# Patient Record
Sex: Female | Born: 1945 | Race: White | Hispanic: No | Marital: Single | Smoking: Former smoker
Health system: Southern US, Community
[De-identification: ages and names within clinical notes are randomized; demographics above are authoritative.]

## PROBLEM LIST (undated history)

## (undated) DIAGNOSIS — I10 Essential (primary) hypertension: Secondary | ICD-10-CM

## (undated) DIAGNOSIS — M1990 Unspecified osteoarthritis, unspecified site: Secondary | ICD-10-CM

## (undated) DIAGNOSIS — J45909 Unspecified asthma, uncomplicated: Secondary | ICD-10-CM

## (undated) DIAGNOSIS — M1611 Unilateral primary osteoarthritis, right hip: Secondary | ICD-10-CM

## (undated) DIAGNOSIS — J302 Other seasonal allergic rhinitis: Secondary | ICD-10-CM

## (undated) HISTORY — PX: ELBOW ARTHROSCOPY WITH TENDON RECONSTRUCTION: SHX5616

## (undated) HISTORY — DX: Unilateral primary osteoarthritis, right hip: M16.11

## (undated) HISTORY — PX: FOOT SURGERY: SHX648

## (undated) HISTORY — DX: Essential (primary) hypertension: I10

---

## 1997-12-19 ENCOUNTER — Other Ambulatory Visit: Admission: RE | Admit: 1997-12-19 | Discharge: 1997-12-19 | Payer: Self-pay | Admitting: Obstetrics and Gynecology

## 1999-01-30 ENCOUNTER — Ambulatory Visit (HOSPITAL_COMMUNITY): Admission: RE | Admit: 1999-01-30 | Discharge: 1999-01-30 | Payer: Self-pay | Admitting: Obstetrics and Gynecology

## 1999-01-30 ENCOUNTER — Encounter: Payer: Self-pay | Admitting: Obstetrics and Gynecology

## 1999-08-20 ENCOUNTER — Other Ambulatory Visit: Admission: RE | Admit: 1999-08-20 | Discharge: 1999-08-20 | Payer: Self-pay | Admitting: Obstetrics and Gynecology

## 2000-02-02 ENCOUNTER — Ambulatory Visit (HOSPITAL_COMMUNITY): Admission: RE | Admit: 2000-02-02 | Discharge: 2000-02-02 | Payer: Self-pay | Admitting: Obstetrics and Gynecology

## 2000-02-02 ENCOUNTER — Encounter: Payer: Self-pay | Admitting: Obstetrics and Gynecology

## 2000-09-14 ENCOUNTER — Other Ambulatory Visit: Admission: RE | Admit: 2000-09-14 | Discharge: 2000-09-14 | Payer: Self-pay | Admitting: Obstetrics and Gynecology

## 2001-07-01 ENCOUNTER — Encounter: Admission: RE | Admit: 2001-07-01 | Discharge: 2001-07-01 | Payer: Self-pay | Admitting: Obstetrics and Gynecology

## 2001-07-01 ENCOUNTER — Encounter: Payer: Self-pay | Admitting: Obstetrics and Gynecology

## 2001-07-21 ENCOUNTER — Encounter: Admission: RE | Admit: 2001-07-21 | Discharge: 2001-07-21 | Payer: Self-pay | Admitting: Obstetrics and Gynecology

## 2001-07-21 ENCOUNTER — Encounter: Payer: Self-pay | Admitting: Obstetrics and Gynecology

## 2001-11-28 ENCOUNTER — Other Ambulatory Visit: Admission: RE | Admit: 2001-11-28 | Discharge: 2001-11-28 | Payer: Self-pay | Admitting: Obstetrics and Gynecology

## 2003-01-17 ENCOUNTER — Other Ambulatory Visit: Admission: RE | Admit: 2003-01-17 | Discharge: 2003-01-17 | Payer: Self-pay | Admitting: Obstetrics and Gynecology

## 2004-02-20 ENCOUNTER — Other Ambulatory Visit: Admission: RE | Admit: 2004-02-20 | Discharge: 2004-02-20 | Payer: Self-pay | Admitting: Obstetrics and Gynecology

## 2005-10-29 ENCOUNTER — Encounter: Admission: RE | Admit: 2005-10-29 | Discharge: 2005-10-29 | Payer: Self-pay | Admitting: Obstetrics and Gynecology

## 2006-04-29 ENCOUNTER — Encounter: Admission: RE | Admit: 2006-04-29 | Discharge: 2006-07-28 | Payer: Self-pay | Admitting: Family Medicine

## 2010-06-26 ENCOUNTER — Ambulatory Visit (INDEPENDENT_AMBULATORY_CARE_PROVIDER_SITE_OTHER): Payer: 59 | Admitting: Internal Medicine

## 2010-06-26 ENCOUNTER — Encounter: Payer: Self-pay | Admitting: Internal Medicine

## 2010-06-26 ENCOUNTER — Other Ambulatory Visit: Payer: Self-pay | Admitting: *Deleted

## 2010-06-26 VITALS — BP 133/74 | HR 77 | Temp 97.2°F | Ht 63.0 in | Wt 204.0 lb

## 2010-06-26 DIAGNOSIS — L0390 Cellulitis, unspecified: Secondary | ICD-10-CM | POA: Insufficient documentation

## 2010-06-26 DIAGNOSIS — L0291 Cutaneous abscess, unspecified: Secondary | ICD-10-CM

## 2010-06-26 MED ORDER — DOXYCYCLINE HYCLATE 100 MG PO CAPS: 100.0000 mg | ORAL_CAPSULE | Freq: Two times a day (BID) | ORAL | Status: DC

## 2010-06-26 NOTE — Assessment & Plan Note (Addendum)
I am concerned with poor healing that it contains an abscess or osteomyelitis.  Will check an MRI.  If negative for drainable focus or osteo, will treat with Rocephin for 7-10 days.    ADDENDUM: MRI results noted, it is not concerning for osteomyelitis or abscess (or even soft tissue infection) but with Carcot, though patient is not a diabetic (glucose is normal).  She will be referred to an orthopedist and I will treat presumed cellulitis with Rocephin.

## 2010-06-26 NOTE — Progress Notes (Signed)
  Subjective:    Patient ID: [name], female    DOB: 05-Apr-1945, 65 y.o.   MRN: [id number]  HPI 65 year old female with HTN comes with a complaint of soft tissue swelling of left foot over old graft site for 3 weeks.  She has had some fever, chills and continues to have some drainage.  She has been on antibiotics for 3 weeks and has had little relief.  Culture with Beta Hemolytic Strep and CoNS.  Had been on Bactrim and Keflex, now on Doxycycline and received 1 shot of Rocephin.  No recent fevers, chills.  No drainage but has continued to have swelling and erythema.     Review of Systems  Constitutional: Negative.   HENT: Negative.   Eyes: Negative.   Respiratory: Negative.   Cardiovascular: Negative.   Gastrointestinal: Negative.   Genitourinary: Negative.   Musculoskeletal: Negative.   Skin: Negative.   Neurological: Negative.   Hematological: Negative.   Psychiatric/Behavioral: Negative.        Objective:   Physical Exam  Constitutional: She appears well-developed and well-nourished.  HENT:  Mouth/Throat: No oropharyngeal exudate.  Cardiovascular: Normal rate, regular rhythm and normal heart sounds.  Exam reveals no gallop.   No murmur heard. Pulmonary/Chest: Effort normal and breath sounds normal. No respiratory distress.  Skin:       Left foot with erythema over graft area, some fluctuance.  Warmth.    Psychiatric: She has a normal mood and affect. Her behavior is normal.          Assessment & Plan:

## 2010-06-27 ENCOUNTER — Other Ambulatory Visit: Payer: Self-pay | Admitting: *Deleted

## 2010-06-27 DIAGNOSIS — I10 Essential (primary) hypertension: Secondary | ICD-10-CM

## 2010-06-27 DIAGNOSIS — L0390 Cellulitis, unspecified: Secondary | ICD-10-CM

## 2010-06-30 ENCOUNTER — Ambulatory Visit (HOSPITAL_COMMUNITY)
Admission: RE | Admit: 2010-06-30 | Discharge: 2010-06-30 | Disposition: A | Discharge status: Home / Self Care | Payer: 59 | Source: Ambulatory Visit | Attending: Internal Medicine | Admitting: Internal Medicine

## 2010-06-30 DIAGNOSIS — M79609 Pain in unspecified limb: Secondary | ICD-10-CM | POA: Insufficient documentation

## 2010-06-30 DIAGNOSIS — R609 Edema, unspecified: Secondary | ICD-10-CM | POA: Insufficient documentation

## 2010-06-30 DIAGNOSIS — M19079 Primary osteoarthritis, unspecified ankle and foot: Secondary | ICD-10-CM | POA: Insufficient documentation

## 2010-06-30 LAB — CBC
HCT: 34.5 % — ABNORMAL LOW (ref 36.0–46.0)
MCV: 85 fL (ref 78.0–100.0)
RBC: 4.06 MIL/uL (ref 3.87–5.11)
WBC: 6.5 10*3/uL (ref 4.0–10.5)

## 2010-06-30 LAB — COMPREHENSIVE METABOLIC PANEL
ALT: 11 U/L (ref 0–35)
AST: 13 U/L (ref 0–37)
Calcium: 9.5 mg/dL (ref 8.4–10.5)
Creatinine, Ser: <0.47 mg/dL — ABNORMAL LOW (ref 0.50–1.10)
Sodium: 139 mEq/L (ref 135–145)
Total Protein: 6 g/dL (ref 6.0–8.3)

## 2010-06-30 LAB — DIFFERENTIAL
Eosinophils Relative: 3 % (ref 0–5)
Lymphocytes Relative: 29 % (ref 12–46)
Lymphs Abs: 1.9 10*3/uL (ref 0.7–4.0)
Neutrophils Relative %: 61 % (ref 43–77)

## 2010-06-30 MED ORDER — GADOBENATE DIMEGLUMINE 529 MG/ML IV SOLN
20.0000 mL | Freq: Once | INTRAVENOUS | Status: AC
  Administered 2010-06-30: 20 mL via INTRAVENOUS

## 2010-07-01 ENCOUNTER — Ambulatory Visit (INDEPENDENT_AMBULATORY_CARE_PROVIDER_SITE_OTHER): Payer: 59 | Admitting: Internal Medicine

## 2010-07-01 DIAGNOSIS — L0390 Cellulitis, unspecified: Secondary | ICD-10-CM

## 2010-07-01 MED ORDER — CEFTRIAXONE SODIUM 1 G IJ SOLR
1.0000 g | INTRAMUSCULAR | Status: AC
  Administered 2010-07-01 – 2010-07-10 (×7): 1 g via INTRAMUSCULAR

## 2010-07-01 NOTE — Progress Notes (Signed)
Addended by: Staci Righter on: 07/01/2010 11:00 AM   Modules accepted: Orders

## 2010-07-01 NOTE — Progress Notes (Signed)
Addended by: Staci Righter on: 07/01/2010 11:08 AM   Modules accepted: Orders

## 2010-07-02 ENCOUNTER — Ambulatory Visit (INDEPENDENT_AMBULATORY_CARE_PROVIDER_SITE_OTHER): Payer: 59 | Admitting: Internal Medicine

## 2010-07-02 ENCOUNTER — Encounter (HOSPITAL_BASED_OUTPATIENT_CLINIC_OR_DEPARTMENT_OTHER): Payer: 59

## 2010-07-02 DIAGNOSIS — L0390 Cellulitis, unspecified: Secondary | ICD-10-CM

## 2010-07-03 ENCOUNTER — Ambulatory Visit: Payer: 59

## 2010-07-03 ENCOUNTER — Ambulatory Visit (INDEPENDENT_AMBULATORY_CARE_PROVIDER_SITE_OTHER): Payer: 59 | Admitting: Internal Medicine

## 2010-07-03 DIAGNOSIS — L0390 Cellulitis, unspecified: Secondary | ICD-10-CM

## 2010-07-03 DIAGNOSIS — L0291 Cutaneous abscess, unspecified: Secondary | ICD-10-CM

## 2010-07-04 ENCOUNTER — Ambulatory Visit (INDEPENDENT_AMBULATORY_CARE_PROVIDER_SITE_OTHER): Payer: 59 | Admitting: Internal Medicine

## 2010-07-04 DIAGNOSIS — L0291 Cutaneous abscess, unspecified: Secondary | ICD-10-CM

## 2010-07-04 DIAGNOSIS — L0390 Cellulitis, unspecified: Secondary | ICD-10-CM

## 2010-07-05 ENCOUNTER — Inpatient Hospital Stay (INDEPENDENT_AMBULATORY_CARE_PROVIDER_SITE_OTHER)
Admission: RE | Admit: 2010-07-05 | Discharge: 2010-07-05 | Disposition: A | Discharge status: Home / Self Care | Payer: 59 | Source: Ambulatory Visit | Attending: Family Medicine | Admitting: Family Medicine

## 2010-07-05 DIAGNOSIS — B999 Unspecified infectious disease: Secondary | ICD-10-CM

## 2010-07-06 ENCOUNTER — Inpatient Hospital Stay (INDEPENDENT_AMBULATORY_CARE_PROVIDER_SITE_OTHER)
Admission: RE | Admit: 2010-07-06 | Discharge: 2010-07-06 | Disposition: A | Discharge status: Home / Self Care | Payer: 59 | Source: Ambulatory Visit | Attending: Emergency Medicine | Admitting: Emergency Medicine

## 2010-07-06 DIAGNOSIS — B999 Unspecified infectious disease: Secondary | ICD-10-CM

## 2010-07-07 ENCOUNTER — Ambulatory Visit (INDEPENDENT_AMBULATORY_CARE_PROVIDER_SITE_OTHER): Payer: 59 | Admitting: Internal Medicine

## 2010-07-07 DIAGNOSIS — L0390 Cellulitis, unspecified: Secondary | ICD-10-CM

## 2010-07-08 ENCOUNTER — Ambulatory Visit (INDEPENDENT_AMBULATORY_CARE_PROVIDER_SITE_OTHER): Payer: 59 | Admitting: Internal Medicine

## 2010-07-08 DIAGNOSIS — L0390 Cellulitis, unspecified: Secondary | ICD-10-CM

## 2010-07-09 ENCOUNTER — Inpatient Hospital Stay (INDEPENDENT_AMBULATORY_CARE_PROVIDER_SITE_OTHER)
Admission: RE | Admit: 2010-07-09 | Discharge: 2010-07-09 | Disposition: A | Discharge status: Home / Self Care | Payer: 59 | Source: Ambulatory Visit

## 2010-07-09 DIAGNOSIS — R6889 Other general symptoms and signs: Secondary | ICD-10-CM

## 2010-07-10 ENCOUNTER — Ambulatory Visit (INDEPENDENT_AMBULATORY_CARE_PROVIDER_SITE_OTHER): Payer: 59 | Admitting: Internal Medicine

## 2010-07-10 DIAGNOSIS — L0390 Cellulitis, unspecified: Secondary | ICD-10-CM

## 2010-07-10 DIAGNOSIS — L0291 Cutaneous abscess, unspecified: Secondary | ICD-10-CM

## 2010-07-21 ENCOUNTER — Ambulatory Visit (INDEPENDENT_AMBULATORY_CARE_PROVIDER_SITE_OTHER): Payer: 59 | Admitting: Internal Medicine

## 2010-07-21 ENCOUNTER — Encounter: Payer: Self-pay | Admitting: Internal Medicine

## 2010-07-21 VITALS — BP 123/83 | HR 75 | Temp 98.2°F | Ht 63.0 in | Wt 208.0 lb

## 2010-07-21 DIAGNOSIS — L0390 Cellulitis, unspecified: Secondary | ICD-10-CM

## 2010-07-21 NOTE — Assessment & Plan Note (Addendum)
Patient is now improved after the 10 days of Rocephin.  Now off crutches and feels much better and pleased with the therapy.  Saw ortho to advise on deformity.  Given good long term expectations.  No current infection.  Return PRN.

## 2010-07-21 NOTE — Progress Notes (Signed)
  Subjective:    Patient ID: [name], female    DOB: August 30, 1945, 65 y.o.   MRN: [id number]  HPI here for follow up after her right leg cellulitis was treated with IM Rocephin.  Feels better, pain much better, no fever, walking without crutches.  No erythema, no chills.  She also did see ortho due to the abnormal MRI.  Discussed long term management of her foot, but no acute issues identified.      Review of Systems  Constitutional: Negative.   HENT: Negative.   Respiratory: Negative.   Cardiovascular: Negative.   Gastrointestinal: Negative.   Musculoskeletal:       Improved ambulation, no crutches  Neurological: Negative.   Psychiatric/Behavioral: Negative.        Objective:   Physical Exam  Constitutional: She is oriented to person, place, and time. She appears well-developed and well-nourished. No distress.  Cardiovascular: Normal rate, regular rhythm and normal heart sounds.  Exam reveals no gallop and no friction rub.   No murmur heard. Pulmonary/Chest: Effort normal and breath sounds normal. No respiratory distress. She has no wheezes. She has no rales.  Abdominal: Soft. Bowel sounds are normal. She exhibits no distension. There is no tenderness.  Musculoskeletal: She exhibits edema.       Some edema of left leg but not unusual for her  Neurological: She is alert and oriented to person, place, and time.  Skin: Skin is warm and dry.  Psychiatric: She has a normal mood and affect. Her behavior is normal.          Assessment & Plan:

## 2010-07-30 ENCOUNTER — Encounter (HOSPITAL_BASED_OUTPATIENT_CLINIC_OR_DEPARTMENT_OTHER): Payer: 59

## 2010-08-13 NOTE — Progress Notes (Signed)
  Subjective:    Patient ID: [name], female    DOB: 03-Oct-1945, 65 y.o.   MRN: [id number]  HPI Pt here for continuation of her injections of Rocephin for cellulitis.     Review of Systems     Objective:   Physical Exam        Assessment & Plan:

## 2011-10-06 DIAGNOSIS — Z23 Encounter for immunization: Secondary | ICD-10-CM | POA: Diagnosis not present

## 2011-10-13 DIAGNOSIS — E785 Hyperlipidemia, unspecified: Secondary | ICD-10-CM | POA: Diagnosis not present

## 2011-10-13 DIAGNOSIS — L259 Unspecified contact dermatitis, unspecified cause: Secondary | ICD-10-CM | POA: Diagnosis not present

## 2011-10-13 DIAGNOSIS — I10 Essential (primary) hypertension: Secondary | ICD-10-CM | POA: Diagnosis not present

## 2011-11-04 DIAGNOSIS — J029 Acute pharyngitis, unspecified: Secondary | ICD-10-CM | POA: Diagnosis not present

## 2012-03-08 DIAGNOSIS — L02519 Cutaneous abscess of unspecified hand: Secondary | ICD-10-CM | POA: Diagnosis not present

## 2012-03-08 DIAGNOSIS — L03119 Cellulitis of unspecified part of limb: Secondary | ICD-10-CM | POA: Diagnosis not present

## 2012-04-12 DIAGNOSIS — I10 Essential (primary) hypertension: Secondary | ICD-10-CM | POA: Diagnosis not present

## 2012-10-05 DIAGNOSIS — Z23 Encounter for immunization: Secondary | ICD-10-CM | POA: Diagnosis not present

## 2013-01-11 DIAGNOSIS — I10 Essential (primary) hypertension: Secondary | ICD-10-CM | POA: Diagnosis not present

## 2013-02-28 DIAGNOSIS — W1809XA Striking against other object with subsequent fall, initial encounter: Secondary | ICD-10-CM | POA: Insufficient documentation

## 2013-02-28 DIAGNOSIS — W108XXA Fall (on) (from) other stairs and steps, initial encounter: Secondary | ICD-10-CM | POA: Insufficient documentation

## 2013-02-28 DIAGNOSIS — Z79899 Other long term (current) drug therapy: Secondary | ICD-10-CM | POA: Insufficient documentation

## 2013-02-28 DIAGNOSIS — S7000XA Contusion of unspecified hip, initial encounter: Secondary | ICD-10-CM | POA: Diagnosis not present

## 2013-02-28 DIAGNOSIS — Z87891 Personal history of nicotine dependence: Secondary | ICD-10-CM | POA: Diagnosis not present

## 2013-02-28 DIAGNOSIS — M25559 Pain in unspecified hip: Secondary | ICD-10-CM | POA: Diagnosis not present

## 2013-02-28 DIAGNOSIS — T1490XA Injury, unspecified, initial encounter: Secondary | ICD-10-CM | POA: Diagnosis not present

## 2013-02-28 DIAGNOSIS — S0100XA Unspecified open wound of scalp, initial encounter: Secondary | ICD-10-CM | POA: Diagnosis not present

## 2013-02-28 DIAGNOSIS — S5000XA Contusion of unspecified elbow, initial encounter: Secondary | ICD-10-CM | POA: Diagnosis not present

## 2013-02-28 DIAGNOSIS — Y9389 Activity, other specified: Secondary | ICD-10-CM | POA: Insufficient documentation

## 2013-02-28 DIAGNOSIS — Z23 Encounter for immunization: Secondary | ICD-10-CM | POA: Insufficient documentation

## 2013-02-28 DIAGNOSIS — I10 Essential (primary) hypertension: Secondary | ICD-10-CM | POA: Diagnosis not present

## 2013-02-28 DIAGNOSIS — S79919A Unspecified injury of unspecified hip, initial encounter: Secondary | ICD-10-CM | POA: Diagnosis not present

## 2013-02-28 DIAGNOSIS — Y92009 Unspecified place in unspecified non-institutional (private) residence as the place of occurrence of the external cause: Secondary | ICD-10-CM | POA: Insufficient documentation
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
## 2013-03-01 ENCOUNTER — Emergency Department (HOSPITAL_COMMUNITY)
Admission: EM | Admit: 2013-03-01 | Discharge: 2013-03-01 | Disposition: A | Discharge status: Home / Self Care | Payer: Medicare Other | Attending: Emergency Medicine | Admitting: Emergency Medicine

## 2013-03-01 ENCOUNTER — Emergency Department (HOSPITAL_COMMUNITY): Payer: Medicare Other

## 2013-03-01 ENCOUNTER — Encounter (HOSPITAL_COMMUNITY): Payer: Self-pay | Admitting: Emergency Medicine

## 2013-03-01 DIAGNOSIS — S79929A Unspecified injury of unspecified thigh, initial encounter: Secondary | ICD-10-CM | POA: Diagnosis not present

## 2013-03-01 DIAGNOSIS — M25559 Pain in unspecified hip: Secondary | ICD-10-CM | POA: Diagnosis not present

## 2013-03-01 DIAGNOSIS — S0191XA Laceration without foreign body of unspecified part of head, initial encounter: Secondary | ICD-10-CM

## 2013-03-01 DIAGNOSIS — S79919A Unspecified injury of unspecified hip, initial encounter: Secondary | ICD-10-CM | POA: Diagnosis not present

## 2013-03-01 DIAGNOSIS — S5002XA Contusion of left elbow, initial encounter: Secondary | ICD-10-CM

## 2013-03-01 DIAGNOSIS — S7000XA Contusion of unspecified hip, initial encounter: Secondary | ICD-10-CM

## 2013-03-01 DIAGNOSIS — W19XXXA Unspecified fall, initial encounter: Secondary | ICD-10-CM

## 2013-03-01 MED ORDER — TETANUS-DIPHTH-ACELL PERTUSSIS 5-2.5-18.5 LF-MCG/0.5 IM SUSP
0.5000 mL | Freq: Once | INTRAMUSCULAR | Status: AC
  Administered 2013-03-01: 0.5 mL via INTRAMUSCULAR

## 2013-03-01 NOTE — ED Provider Notes (Signed)
Medical screening examination/treatment/procedure(s) were conducted as a shared visit with non-physician practitioner(s) or resident  and myself.  I personally evaluated the patient during the encounter and agree with the findings and plan unless otherwise indicated.    I have personally reviewed any xrays and/ or EKG's with the provider and I agree with interpretation.   Pt fell down two steps and hit her head on wood floor.  No loc, vomiting, blood thinners or headache.  Neuro intact.   Alert, oriented, conversant, smiling, neck supple, eofmi, perrl, laceration to scalp, mild bleeding.  CNs intact.  Discussed r/b of CT scan, pt very low pretest prob, pt requesting to hold at this time, strict reasons to return given. Lac repaired by myself and PA, I assisted with staples.    Fall, Acute head injury, scalp laceration  Mariea Clonts, MD 03/01/13 [phone]

## 2013-03-01 NOTE — Discharge Instructions (Signed)
Call for a follow up appointment with a Family or Primary Care Provider for a suture removal in 6-8 days.  Return if Symptoms worsen.   Take medication as prescribed. You can take Ibuprofen for muscle and joint pain as needed, take with food.

## 2013-03-01 NOTE — ED Notes (Signed)
Patient transported to X-ray 
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
## 2013-03-06 NOTE — ED Provider Notes (Signed)
Medical screening examination/treatment/procedure(s) were conducted as a shared visit with non-physician practitioner(s) or resident  and myself.  I personally evaluated the patient during the encounter and agree with the findings and plan unless otherwise indicated.    I have personally reviewed any xrays and/ or EKG's with the provider and I agree with interpretation.   See separate note, I assisted with lac repair.  Mariea Clonts, MD 03/06/13 [phone]

## 2013-03-08 DIAGNOSIS — Z4802 Encounter for removal of sutures: Secondary | ICD-10-CM | POA: Diagnosis not present

## 2013-03-08 DIAGNOSIS — S0190XA Unspecified open wound of unspecified part of head, initial encounter: Secondary | ICD-10-CM | POA: Diagnosis not present

## 2013-10-20 ENCOUNTER — Other Ambulatory Visit: Payer: Self-pay

## 2013-11-01 DIAGNOSIS — L249 Irritant contact dermatitis, unspecified cause: Secondary | ICD-10-CM | POA: Diagnosis not present

## 2013-11-01 DIAGNOSIS — Z23 Encounter for immunization: Secondary | ICD-10-CM | POA: Diagnosis not present

## 2013-11-01 DIAGNOSIS — I10 Essential (primary) hypertension: Secondary | ICD-10-CM | POA: Diagnosis not present

## 2013-11-01 DIAGNOSIS — E785 Hyperlipidemia, unspecified: Secondary | ICD-10-CM | POA: Diagnosis not present

## 2013-12-15 DIAGNOSIS — M549 Dorsalgia, unspecified: Secondary | ICD-10-CM | POA: Diagnosis not present

## 2014-03-26 DIAGNOSIS — J45909 Unspecified asthma, uncomplicated: Secondary | ICD-10-CM | POA: Diagnosis not present

## 2014-03-26 DIAGNOSIS — J209 Acute bronchitis, unspecified: Secondary | ICD-10-CM | POA: Diagnosis not present

## 2014-04-09 DIAGNOSIS — J45909 Unspecified asthma, uncomplicated: Secondary | ICD-10-CM | POA: Diagnosis not present

## 2014-04-09 DIAGNOSIS — I10 Essential (primary) hypertension: Secondary | ICD-10-CM | POA: Diagnosis not present

## 2014-04-30 DIAGNOSIS — J302 Other seasonal allergic rhinitis: Secondary | ICD-10-CM | POA: Diagnosis not present

## 2014-04-30 DIAGNOSIS — M1990 Unspecified osteoarthritis, unspecified site: Secondary | ICD-10-CM | POA: Diagnosis not present

## 2014-04-30 DIAGNOSIS — I10 Essential (primary) hypertension: Secondary | ICD-10-CM | POA: Diagnosis not present

## 2014-04-30 DIAGNOSIS — J45909 Unspecified asthma, uncomplicated: Secondary | ICD-10-CM | POA: Diagnosis not present

## 2014-05-16 DIAGNOSIS — J302 Other seasonal allergic rhinitis: Secondary | ICD-10-CM | POA: Diagnosis not present

## 2014-05-16 DIAGNOSIS — J209 Acute bronchitis, unspecified: Secondary | ICD-10-CM | POA: Diagnosis not present

## 2014-05-16 DIAGNOSIS — J45909 Unspecified asthma, uncomplicated: Secondary | ICD-10-CM | POA: Diagnosis not present

## 2014-07-02 ENCOUNTER — Other Ambulatory Visit: Payer: Self-pay

## 2014-07-03 DIAGNOSIS — I10 Essential (primary) hypertension: Secondary | ICD-10-CM | POA: Diagnosis not present

## 2014-07-03 DIAGNOSIS — J45909 Unspecified asthma, uncomplicated: Secondary | ICD-10-CM | POA: Diagnosis not present

## 2014-08-09 DIAGNOSIS — J45909 Unspecified asthma, uncomplicated: Secondary | ICD-10-CM | POA: Diagnosis not present

## 2014-08-09 DIAGNOSIS — J984 Other disorders of lung: Secondary | ICD-10-CM | POA: Diagnosis not present

## 2014-08-09 DIAGNOSIS — E785 Hyperlipidemia, unspecified: Secondary | ICD-10-CM | POA: Diagnosis not present

## 2014-08-09 DIAGNOSIS — E669 Obesity, unspecified: Secondary | ICD-10-CM | POA: Diagnosis not present

## 2014-08-09 DIAGNOSIS — I10 Essential (primary) hypertension: Secondary | ICD-10-CM | POA: Diagnosis not present

## 2014-09-20 DIAGNOSIS — M25552 Pain in left hip: Secondary | ICD-10-CM | POA: Diagnosis not present

## 2014-09-24 DIAGNOSIS — M25551 Pain in right hip: Secondary | ICD-10-CM | POA: Diagnosis not present

## 2014-09-24 DIAGNOSIS — M1611 Unilateral primary osteoarthritis, right hip: Secondary | ICD-10-CM | POA: Diagnosis not present

## 2014-09-27 DIAGNOSIS — M25552 Pain in left hip: Secondary | ICD-10-CM | POA: Diagnosis not present

## 2014-10-18 DIAGNOSIS — M170 Bilateral primary osteoarthritis of knee: Secondary | ICD-10-CM | POA: Diagnosis not present

## 2014-10-18 DIAGNOSIS — M25551 Pain in right hip: Secondary | ICD-10-CM | POA: Diagnosis not present

## 2014-10-23 DIAGNOSIS — M25561 Pain in right knee: Secondary | ICD-10-CM | POA: Diagnosis not present

## 2014-12-10 DIAGNOSIS — E785 Hyperlipidemia, unspecified: Secondary | ICD-10-CM | POA: Diagnosis not present

## 2014-12-10 DIAGNOSIS — Z23 Encounter for immunization: Secondary | ICD-10-CM | POA: Diagnosis not present

## 2014-12-10 DIAGNOSIS — E669 Obesity, unspecified: Secondary | ICD-10-CM | POA: Diagnosis not present

## 2014-12-10 DIAGNOSIS — Z1239 Encounter for other screening for malignant neoplasm of breast: Secondary | ICD-10-CM | POA: Diagnosis not present

## 2014-12-10 DIAGNOSIS — Z1211 Encounter for screening for malignant neoplasm of colon: Secondary | ICD-10-CM | POA: Diagnosis not present

## 2014-12-10 DIAGNOSIS — J302 Other seasonal allergic rhinitis: Secondary | ICD-10-CM | POA: Diagnosis not present

## 2014-12-10 DIAGNOSIS — J45909 Unspecified asthma, uncomplicated: Secondary | ICD-10-CM | POA: Diagnosis not present

## 2014-12-10 DIAGNOSIS — I10 Essential (primary) hypertension: Secondary | ICD-10-CM | POA: Diagnosis not present

## 2014-12-10 DIAGNOSIS — M1990 Unspecified osteoarthritis, unspecified site: Secondary | ICD-10-CM | POA: Diagnosis not present

## 2014-12-10 DIAGNOSIS — J984 Other disorders of lung: Secondary | ICD-10-CM | POA: Diagnosis not present

## 2015-01-01 DIAGNOSIS — H2511 Age-related nuclear cataract, right eye: Secondary | ICD-10-CM | POA: Diagnosis not present

## 2015-01-01 DIAGNOSIS — H2512 Age-related nuclear cataract, left eye: Secondary | ICD-10-CM | POA: Diagnosis not present

## 2015-01-24 DIAGNOSIS — M1611 Unilateral primary osteoarthritis, right hip: Secondary | ICD-10-CM | POA: Diagnosis not present

## 2015-01-24 DIAGNOSIS — M25561 Pain in right knee: Secondary | ICD-10-CM | POA: Diagnosis not present

## 2015-04-29 DIAGNOSIS — M1711 Unilateral primary osteoarthritis, right knee: Secondary | ICD-10-CM | POA: Diagnosis not present

## 2015-04-29 DIAGNOSIS — M1712 Unilateral primary osteoarthritis, left knee: Secondary | ICD-10-CM | POA: Diagnosis not present

## 2015-05-02 DIAGNOSIS — M1611 Unilateral primary osteoarthritis, right hip: Secondary | ICD-10-CM | POA: Diagnosis not present

## 2015-05-02 DIAGNOSIS — M25561 Pain in right knee: Secondary | ICD-10-CM | POA: Diagnosis not present

## 2015-05-10 DIAGNOSIS — M1990 Unspecified osteoarthritis, unspecified site: Secondary | ICD-10-CM | POA: Diagnosis not present

## 2015-05-10 DIAGNOSIS — I10 Essential (primary) hypertension: Secondary | ICD-10-CM | POA: Diagnosis not present

## 2015-05-10 DIAGNOSIS — E785 Hyperlipidemia, unspecified: Secondary | ICD-10-CM | POA: Diagnosis not present

## 2015-05-10 DIAGNOSIS — J45909 Unspecified asthma, uncomplicated: Secondary | ICD-10-CM | POA: Diagnosis not present

## 2015-05-10 DIAGNOSIS — E669 Obesity, unspecified: Secondary | ICD-10-CM | POA: Diagnosis not present

## 2015-08-06 DIAGNOSIS — M1611 Unilateral primary osteoarthritis, right hip: Secondary | ICD-10-CM | POA: Diagnosis not present

## 2015-08-12 DIAGNOSIS — M1711 Unilateral primary osteoarthritis, right knee: Secondary | ICD-10-CM | POA: Diagnosis not present

## 2015-08-12 DIAGNOSIS — M25561 Pain in right knee: Secondary | ICD-10-CM | POA: Diagnosis not present

## 2015-08-12 DIAGNOSIS — M1611 Unilateral primary osteoarthritis, right hip: Secondary | ICD-10-CM | POA: Diagnosis not present

## 2015-08-12 DIAGNOSIS — M1712 Unilateral primary osteoarthritis, left knee: Secondary | ICD-10-CM | POA: Diagnosis not present

## 2015-08-24 ENCOUNTER — Encounter (INDEPENDENT_AMBULATORY_CARE_PROVIDER_SITE_OTHER): Payer: Self-pay

## 2015-08-24 DIAGNOSIS — M25551 Pain in right hip: Secondary | ICD-10-CM

## 2015-09-10 DIAGNOSIS — M25551 Pain in right hip: Secondary | ICD-10-CM | POA: Diagnosis not present

## 2015-09-10 DIAGNOSIS — M1611 Unilateral primary osteoarthritis, right hip: Secondary | ICD-10-CM | POA: Diagnosis not present

## 2015-09-12 DIAGNOSIS — J45909 Unspecified asthma, uncomplicated: Secondary | ICD-10-CM | POA: Diagnosis not present

## 2015-09-12 DIAGNOSIS — M1990 Unspecified osteoarthritis, unspecified site: Secondary | ICD-10-CM | POA: Diagnosis not present

## 2015-09-12 DIAGNOSIS — Z6833 Body mass index (BMI) 33.0-33.9, adult: Secondary | ICD-10-CM | POA: Diagnosis not present

## 2015-09-12 DIAGNOSIS — E785 Hyperlipidemia, unspecified: Secondary | ICD-10-CM | POA: Diagnosis not present

## 2015-09-12 DIAGNOSIS — M1611 Unilateral primary osteoarthritis, right hip: Secondary | ICD-10-CM | POA: Diagnosis not present

## 2015-09-12 DIAGNOSIS — E669 Obesity, unspecified: Secondary | ICD-10-CM | POA: Diagnosis not present

## 2015-09-12 DIAGNOSIS — I10 Essential (primary) hypertension: Secondary | ICD-10-CM | POA: Diagnosis not present

## 2015-09-16 ENCOUNTER — Other Ambulatory Visit: Payer: Self-pay | Admitting: Physician Assistant

## 2015-09-18 NOTE — Pre-Procedure Instructions (Signed)
KAYLEEN JACKLIN  09/18/2015      [id number] - [address], [place]  [place] [address] Phone: [phone] Fax: [phone]    Your procedure is scheduled on Tues, Sept 26 @ 3:00 PM  Report to [facility] Admitting at 1:00 AM  Call this number if you have problems the morning of surgery:  [phone]   Remember:  Do not eat food or drink liquids after midnight.    Do not wear jewelry, make-up or nail polish.  Do not wear lotions, powders, or perfumes, or deoderant.  Do not shave 48 hours prior to surgery.    Do not bring valuables to the hospital.  [facility] is not responsible for any belongings or valuables.  Contacts, dentures or bridgework may not be worn into surgery.  Leave your suitcase in the car.  After surgery it may be brought to your room.  For patients admitted to the hospital, discharge time will be determined by your treatment team.  Patients discharged the day of surgery will not be allowed to drive home.    [facility]  Before surgery, you can play an important role.  Because skin is not sterile, your skin needs to be as free of germs as possible.  You can reduce the number of germs on you skin by washing with CHG (chlorahexidine gluconate) soap before surgery.  CHG is an antiseptic cleaner which kills germs and bonds with the skin to continue killing germs even after washing.  Please DO NOT use if you have an allergy to CHG or antibacterial soaps.  If your skin becomes reddened/irritated stop using the CHG and inform your nurse when you arrive at Short Stay.  Do not shave (including legs and underarms) for at least 48 hours prior to the first CHG shower.  You may shave your face.  Please follow these instructions carefully:   1.  Shower with CHG Soap the night before surgery and the                                morning of Surgery.  2.  If you choose to wash your  hair, wash your hair first as usual with your       normal shampoo.  3.  After you shampoo, rinse your hair and body thoroughly to remove the                      Shampoo.  4.  Use CHG as you would any other liquid soap.  You can apply chg directly       to the skin and wash gently with scrungie or a clean washcloth.  5.  Apply the CHG Soap to your body ONLY FROM THE NECK DOWN.        Do not use on open wounds or open sores.  Avoid contact with your eyes,       ears, mouth and genitals (private parts).  Wash genitals (private parts)       with your normal soap.  6.  Wash thoroughly, paying special attention to the area where your surgery        will be performed.  7.  Thoroughly rinse your body with warm water from the neck down.  8.  DO NOT shower/wash with your normal soap after using and rinsing off  the CHG Soap.  9.  Pat yourself dry with a clean towel.            10.  Wear clean pajamas.            11.  Place clean sheets on your bed the night of your first shower and do not        sleep with pets.  Day of Surgery  Do not apply any lotions/deoderants the morning of surgery.  Please wear clean clothes to the hospital/surgery center.   Please read over the following fact sheets that you were given. Pain Booklet, Coughing and Deep Breathing, MRSA Information and Surgical Site Infection Prevention

## 2015-09-19 ENCOUNTER — Inpatient Hospital Stay (HOSPITAL_COMMUNITY)
Admission: RE | Admit: 2015-09-19 | Discharge: 2015-09-19 | Disposition: A | Discharge status: Home / Self Care | Payer: No Typology Code available for payment source | Source: Ambulatory Visit

## 2015-09-25 ENCOUNTER — Encounter (HOSPITAL_COMMUNITY)
Admission: RE | Admit: 2015-09-25 | Discharge: 2015-09-25 | Disposition: A | Discharge status: Home / Self Care | Payer: Medicare Other | Source: Ambulatory Visit | Attending: Orthopaedic Surgery | Admitting: Orthopaedic Surgery

## 2015-09-25 ENCOUNTER — Encounter (HOSPITAL_COMMUNITY): Payer: Self-pay

## 2015-09-25 DIAGNOSIS — Z01810 Encounter for preprocedural cardiovascular examination: Secondary | ICD-10-CM | POA: Insufficient documentation

## 2015-09-25 DIAGNOSIS — I10 Essential (primary) hypertension: Secondary | ICD-10-CM | POA: Insufficient documentation

## 2015-09-25 DIAGNOSIS — Z01812 Encounter for preprocedural laboratory examination: Secondary | ICD-10-CM | POA: Insufficient documentation

## 2015-09-25 HISTORY — DX: Other seasonal allergic rhinitis: J30.2

## 2015-09-25 HISTORY — DX: Unspecified osteoarthritis, unspecified site: M19.90

## 2015-09-25 HISTORY — DX: Unspecified asthma, uncomplicated: J45.909

## 2015-09-25 LAB — SURGICAL PCR SCREEN
MRSA, PCR: NEGATIVE
Staphylococcus aureus: NEGATIVE

## 2015-09-25 LAB — CBC
HCT: 42.5 % (ref 36.0–46.0)
Hemoglobin: 13.8 g/dL (ref 12.0–15.0)
MCH: 28.6 pg (ref 26.0–34.0)
MCHC: 32.5 g/dL (ref 30.0–36.0)
MCV: 88 fL (ref 78.0–100.0)
PLATELETS: 186 10*3/uL (ref 150–400)
RBC: 4.83 MIL/uL (ref 3.87–5.11)
RDW: 13.6 % (ref 11.5–15.5)
WBC: 5.9 10*3/uL (ref 4.0–10.5)

## 2015-09-25 LAB — BASIC METABOLIC PANEL
ANION GAP: 6 (ref 5–15)
BUN: 15 mg/dL (ref 6–20)
CHLORIDE: 107 mmol/L (ref 101–111)
CO2: 25 mmol/L (ref 22–32)
CREATININE: 0.63 mg/dL (ref 0.44–1.00)
Calcium: 9.8 mg/dL (ref 8.9–10.3)
GFR calc non Af Amer: >60 mL/min (ref >60)
Glucose, Bld: 90 mg/dL (ref 65–99)
Potassium: 3.7 mmol/L (ref 3.5–5.1)
SODIUM: 138 mmol/L (ref 135–145)

## 2015-09-25 NOTE — Pre-Procedure Instructions (Signed)
[name]  09/25/2015      [id number] [address], [place] [address] Phone: [phone] Fax: [phone]    Your procedure is scheduled on September 26  Report to [facility] Admitting at 1:30 P.M.  Call this number if you have problems the morning of surgery:  [phone]   Remember:  Do not eat food or drink liquids after midnight.   Take these medicines the morning of surgery with A SIP OF WATER NONE  7 days prior to surgery STOP taking any Aspirin, Aleve, Naproxen, Ibuprofen, Motrin, Advil, Goody's, BC's, all herbal medications, fish oil, and all vitamins    Do not wear jewelry, make-up or nail polish.  Do not wear lotions, powders, or perfumes, or deoderant.  Do not shave 48 hours prior to surgery.    Do not bring valuables to the hospital.  [facility] is not responsible for any belongings or valuables.  Contacts, dentures or bridgework may not be worn into surgery.  Leave your suitcase in the car.  After surgery it may be brought to your room.  For patients admitted to the hospital, discharge time will be determined by your treatment team.  Patients discharged the day of surgery will not be allowed to drive home.  Special instructions:   [facility]- Preparing For Surgery  Before surgery, you can play an important role. Because skin is not sterile, your skin needs to be as free of germs as possible. You can reduce the number of germs on your skin by washing with CHG (chlorahexidine gluconate) Soap before surgery.  CHG is an antiseptic cleaner which kills germs and bonds with the skin to continue killing germs even after washing.  Please do not use if you have an allergy to CHG or antibacterial soaps. If your skin becomes reddened/irritated stop using the CHG.  Do not shave (including legs and underarms) for at least 48 hours prior to first CHG shower. It is OK to shave your face.  Please follow  these instructions carefully.   1. Shower the NIGHT BEFORE SURGERY and the MORNING OF SURGERY with CHG.   2. If you chose to wash your hair, wash your hair first as usual with your normal shampoo.  3. After you shampoo, rinse your hair and body thoroughly to remove the shampoo.  4. Use CHG as you would any other liquid soap. You can apply CHG directly to the skin and wash gently with a scrungie or a clean washcloth.   5. Apply the CHG Soap to your body ONLY FROM THE NECK DOWN.  Do not use on open wounds or open sores. Avoid contact with your eyes, ears, mouth and genitals (private parts). Wash genitals (private parts) with your normal soap.  6. Wash thoroughly, paying special attention to the area where your surgery will be performed.  7. Thoroughly rinse your body with warm water from the neck down.  8. DO NOT shower/wash with your normal soap after using and rinsing off the CHG Soap.  9. Pat yourself dry with a CLEAN TOWEL.   10. Wear CLEAN PAJAMAS   11. Place CLEAN SHEETS on your bed the night of your first shower and DO NOT SLEEP WITH PETS.    Day of Surgery: Do not apply any deodorants/lotions. Please wear clean clothes to the hospital/surgery center.      Please read over the following fact sheets that you were given. Coughing  and Deep Breathing, Total Joint Packet, MRSA Information and Surgical Site Infection Prevention

## 2015-09-25 NOTE — Progress Notes (Signed)
PCP - Ulanda Edison, MD Cardiologist - denies   Chest x-ray - not needed EKG - 09/25/15 Stress Test - denies ECHO - denies Cardiac Cath - denies       Patient denies shortness of breath, fever, cough and chest pain at PAT appointment

## 2015-09-26 NOTE — Progress Notes (Addendum)
Anesthesia Chart Review:  Pt is a 70 year old female scheduled for R total hip arthoplasty anterior approach on 10/01/2015 with Jean Rosenthal, MD.   - PCP is Yaakov Guthrie, MD who is aware of upcoming surgery.   PMH includes:  HTN, asthma. Former smoker. BMI 36  Medications include: lisinopril-hctz  Preoperative labs reviewed.    EKG 09/25/15: NSR. Moderate voltage criteria for LVH, may be normal variant. Inferior infarct, age undetermined. No EKG available for comparison  Reviewed case with Dr. Oletta Lamas. Pt will need further assessment by assigned anesthesiologist DOS.   Willeen Cass, FNP-BC [facility] Stay Surgical [facility]/Anesthesiology Phone: [phone] 09/27/2015 4:41 PM

## 2015-09-30 MED ORDER — TRANEXAMIC ACID 1000 MG/10ML IV SOLN
1000.0000 mg | INTRAVENOUS | Status: DC
  Filled 2015-09-30: qty 10

## 2015-10-01 ENCOUNTER — Encounter (HOSPITAL_COMMUNITY): Payer: Self-pay | Admitting: General Practice

## 2015-10-01 ENCOUNTER — Inpatient Hospital Stay (HOSPITAL_COMMUNITY): Payer: Medicare Other | Admitting: Anesthesiology

## 2015-10-01 ENCOUNTER — Encounter (HOSPITAL_COMMUNITY)
Admission: RE | Disposition: A | Discharge status: Home Health | Payer: Self-pay | Source: Ambulatory Visit | Attending: Orthopaedic Surgery

## 2015-10-01 ENCOUNTER — Inpatient Hospital Stay (HOSPITAL_COMMUNITY): Payer: Medicare Other | Admitting: Emergency Medicine

## 2015-10-01 ENCOUNTER — Inpatient Hospital Stay (HOSPITAL_COMMUNITY)
Admission: RE | Admit: 2015-10-01 | Discharge: 2015-10-04 | DRG: 470 | Disposition: A | Discharge status: Home Health | Payer: Medicare Other | Source: Ambulatory Visit | Attending: Orthopaedic Surgery | Admitting: Orthopaedic Surgery

## 2015-10-01 ENCOUNTER — Inpatient Hospital Stay (HOSPITAL_COMMUNITY): Payer: Medicare Other

## 2015-10-01 DIAGNOSIS — M169 Osteoarthritis of hip, unspecified: Secondary | ICD-10-CM | POA: Diagnosis not present

## 2015-10-01 DIAGNOSIS — M1611 Unilateral primary osteoarthritis, right hip: Principal | ICD-10-CM

## 2015-10-01 DIAGNOSIS — J45909 Unspecified asthma, uncomplicated: Secondary | ICD-10-CM | POA: Diagnosis present

## 2015-10-01 DIAGNOSIS — I10 Essential (primary) hypertension: Secondary | ICD-10-CM | POA: Diagnosis present

## 2015-10-01 DIAGNOSIS — Z881 Allergy status to other antibiotic agents status: Secondary | ICD-10-CM | POA: Diagnosis not present

## 2015-10-01 DIAGNOSIS — Z419 Encounter for procedure for purposes other than remedying health state, unspecified: Secondary | ICD-10-CM

## 2015-10-01 DIAGNOSIS — Z79899 Other long term (current) drug therapy: Secondary | ICD-10-CM

## 2015-10-01 DIAGNOSIS — Z885 Allergy status to narcotic agent status: Secondary | ICD-10-CM | POA: Diagnosis not present

## 2015-10-01 DIAGNOSIS — Z96641 Presence of right artificial hip joint: Secondary | ICD-10-CM

## 2015-10-01 DIAGNOSIS — Z471 Aftercare following joint replacement surgery: Secondary | ICD-10-CM | POA: Diagnosis not present

## 2015-10-01 DIAGNOSIS — Z87891 Personal history of nicotine dependence: Secondary | ICD-10-CM | POA: Diagnosis not present

## 2015-10-01 HISTORY — DX: Unilateral primary osteoarthritis, right hip: M16.11

## 2015-10-01 HISTORY — PX: TOTAL HIP ARTHROPLASTY: SHX124

## 2015-10-01 SURGERY — ARTHROPLASTY, HIP, TOTAL, ANTERIOR APPROACH
Anesthesia: Monitor Anesthesia Care | Site: Hip | Laterality: Right

## 2015-10-01 MED ORDER — ACETAMINOPHEN 325 MG PO TABS: 650.0000 mg | ORAL_TABLET | Freq: Four times a day (QID) | ORAL | Status: DC | PRN

## 2015-10-01 MED ORDER — TRANEXAMIC ACID 1000 MG/10ML IV SOLN
INTRAVENOUS | Status: DC | PRN
  Administered 2015-10-01: 1000 mg via INTRAVENOUS

## 2015-10-01 MED ORDER — VANCOMYCIN HCL IN DEXTROSE 1-5 GM/200ML-% IV SOLN
INTRAVENOUS | Status: AC
  Filled 2015-10-01: qty 200

## 2015-10-01 MED ORDER — LIDOCAINE HCL (CARDIAC) 20 MG/ML IV SOLN
INTRAVENOUS | Status: DC | PRN
  Administered 2015-10-01: 60 mg via INTRAVENOUS

## 2015-10-01 MED ORDER — HYDROMORPHONE HCL 1 MG/ML IJ SOLN: 1.0000 mg | INTRAMUSCULAR | Status: DC | PRN

## 2015-10-01 MED ORDER — HYDROMORPHONE HCL 1 MG/ML IJ SOLN
0.2500 mg | INTRAMUSCULAR | Status: DC | PRN
  Administered 2015-10-01 (×2): 0.25 mg via INTRAVENOUS
  Administered 2015-10-01: 0.5 mg via INTRAVENOUS

## 2015-10-01 MED ORDER — GLYCOPYRROLATE 0.2 MG/ML IV SOSY
PREFILLED_SYRINGE | INTRAVENOUS | Status: AC
  Filled 2015-10-01: qty 3

## 2015-10-01 MED ORDER — DOCUSATE SODIUM 100 MG PO CAPS
100.0000 mg | ORAL_CAPSULE | Freq: Two times a day (BID) | ORAL | Status: DC
  Administered 2015-10-01 – 2015-10-04 (×6): 100 mg via ORAL
  Filled 2015-10-01 (×6): qty 1

## 2015-10-01 MED ORDER — ALUM & MAG HYDROXIDE-SIMETH 200-200-20 MG/5ML PO SUSP: 30.0000 mL | ORAL | Status: DC | PRN

## 2015-10-01 MED ORDER — VANCOMYCIN HCL IN DEXTROSE 1-5 GM/200ML-% IV SOLN
1000.0000 mg | Freq: Two times a day (BID) | INTRAVENOUS | Status: AC
  Administered 2015-10-01: 1000 mg via INTRAVENOUS
  Filled 2015-10-01: qty 200

## 2015-10-01 MED ORDER — METOCLOPRAMIDE HCL 5 MG PO TABS: 5.0000 mg | ORAL_TABLET | Freq: Three times a day (TID) | ORAL | Status: DC | PRN

## 2015-10-01 MED ORDER — OXYCODONE HCL 5 MG PO TABS
ORAL_TABLET | ORAL | Status: AC
  Administered 2015-10-01: 10 mg via ORAL
  Filled 2015-10-01: qty 2

## 2015-10-01 MED ORDER — VANCOMYCIN HCL IN DEXTROSE 1-5 GM/200ML-% IV SOLN
1000.0000 mg | Freq: Once | INTRAVENOUS | Status: AC
  Administered 2015-10-01: 1000 mg via INTRAVENOUS

## 2015-10-01 MED ORDER — PHENOL 1.4 % MT LIQD: 1.0000 | OROMUCOSAL | Status: DC | PRN

## 2015-10-01 MED ORDER — METHOCARBAMOL 500 MG PO TABS
ORAL_TABLET | ORAL | Status: AC
  Administered 2015-10-01: 500 mg via ORAL
  Filled 2015-10-01: qty 1

## 2015-10-01 MED ORDER — SODIUM CHLORIDE 0.9 % IV SOLN
INTRAVENOUS | Status: DC
  Administered 2015-10-01: 21:00:00 via INTRAVENOUS

## 2015-10-01 MED ORDER — ACETAMINOPHEN 650 MG RE SUPP: 650.0000 mg | Freq: Four times a day (QID) | RECTAL | Status: DC | PRN

## 2015-10-01 MED ORDER — 0.9 % SODIUM CHLORIDE (POUR BTL) OPTIME
TOPICAL | Status: DC | PRN
  Administered 2015-10-01: 1000 mL

## 2015-10-01 MED ORDER — MENTHOL 3 MG MT LOZG: 1.0000 | LOZENGE | OROMUCOSAL | Status: DC | PRN

## 2015-10-01 MED ORDER — FENTANYL CITRATE (PF) 100 MCG/2ML IJ SOLN
INTRAMUSCULAR | Status: DC | PRN
  Administered 2015-10-01: 50 ug via INTRAVENOUS
  Administered 2015-10-01 (×2): 25 ug via INTRAVENOUS

## 2015-10-01 MED ORDER — LACTATED RINGERS IV SOLN
INTRAVENOUS | Status: DC
  Administered 2015-10-01 (×3): via INTRAVENOUS

## 2015-10-01 MED ORDER — OXYCODONE HCL 5 MG PO TABS
5.0000 mg | ORAL_TABLET | ORAL | Status: DC | PRN
  Administered 2015-10-01 – 2015-10-02 (×4): 10 mg via ORAL
  Administered 2015-10-03: 5 mg via ORAL
  Administered 2015-10-03 – 2015-10-04 (×3): 10 mg via ORAL
  Filled 2015-10-01 (×2): qty 2
  Filled 2015-10-01: qty 1
  Filled 2015-10-01 (×4): qty 2

## 2015-10-01 MED ORDER — ASPIRIN 81 MG PO CHEW
81.0000 mg | CHEWABLE_TABLET | Freq: Two times a day (BID) | ORAL | Status: DC
  Administered 2015-10-01 – 2015-10-04 (×6): 81 mg via ORAL
  Filled 2015-10-01 (×6): qty 1

## 2015-10-01 MED ORDER — CHLORHEXIDINE GLUCONATE 4 % EX LIQD: 60.0000 mL | Freq: Once | CUTANEOUS | Status: DC

## 2015-10-01 MED ORDER — PHENYLEPHRINE HCL 10 MG/ML IJ SOLN
INTRAVENOUS | Status: DC | PRN
  Administered 2015-10-01: 25 ug/min via INTRAVENOUS

## 2015-10-01 MED ORDER — METHOCARBAMOL 1000 MG/10ML IJ SOLN
500.0000 mg | Freq: Four times a day (QID) | INTRAVENOUS | Status: DC | PRN
  Filled 2015-10-01: qty 5

## 2015-10-01 MED ORDER — ONDANSETRON HCL 4 MG/2ML IJ SOLN: 4.0000 mg | Freq: Four times a day (QID) | INTRAMUSCULAR | Status: DC | PRN

## 2015-10-01 MED ORDER — SODIUM CHLORIDE 0.9 % IR SOLN
Status: DC | PRN
  Administered 2015-10-01: 3000 mL

## 2015-10-01 MED ORDER — DIPHENHYDRAMINE HCL 12.5 MG/5ML PO ELIX: 12.5000 mg | ORAL_SOLUTION | ORAL | Status: DC | PRN

## 2015-10-01 MED ORDER — METOCLOPRAMIDE HCL 5 MG/ML IJ SOLN: 5.0000 mg | Freq: Three times a day (TID) | INTRAMUSCULAR | Status: DC | PRN

## 2015-10-01 MED ORDER — BUPIVACAINE IN DEXTROSE 0.75-8.25 % IT SOLN
INTRATHECAL | Status: DC | PRN
  Administered 2015-10-01: 2 mL via INTRATHECAL

## 2015-10-01 MED ORDER — PROMETHAZINE HCL 25 MG/ML IJ SOLN: 6.2500 mg | INTRAMUSCULAR | Status: DC | PRN

## 2015-10-01 MED ORDER — ONDANSETRON HCL 4 MG/2ML IJ SOLN
INTRAMUSCULAR | Status: AC
  Filled 2015-10-01: qty 2

## 2015-10-01 MED ORDER — GLYCOPYRROLATE 0.2 MG/ML IJ SOLN
INTRAMUSCULAR | Status: DC | PRN
  Administered 2015-10-01: 0.1 mg via INTRAVENOUS

## 2015-10-01 MED ORDER — ONDANSETRON HCL 4 MG PO TABS: 4.0000 mg | ORAL_TABLET | Freq: Four times a day (QID) | ORAL | Status: DC | PRN

## 2015-10-01 MED ORDER — METHOCARBAMOL 500 MG PO TABS
500.0000 mg | ORAL_TABLET | Freq: Four times a day (QID) | ORAL | Status: DC | PRN
  Administered 2015-10-01 – 2015-10-04 (×8): 500 mg via ORAL
  Filled 2015-10-01 (×7): qty 1

## 2015-10-01 MED ORDER — FENTANYL CITRATE (PF) 100 MCG/2ML IJ SOLN
INTRAMUSCULAR | Status: AC
  Filled 2015-10-01: qty 2

## 2015-10-01 MED ORDER — HYDROMORPHONE HCL 1 MG/ML IJ SOLN
INTRAMUSCULAR | Status: AC
  Administered 2015-10-01: 0.25 mg via INTRAVENOUS
  Filled 2015-10-01: qty 1

## 2015-10-01 MED ORDER — PROPOFOL 500 MG/50ML IV EMUL
INTRAVENOUS | Status: DC | PRN
  Administered 2015-10-01: 25 ug/kg/min via INTRAVENOUS

## 2015-10-01 SURGICAL SUPPLY — 55 items
APL SKNCLS STERI-STRIP NONHPOA (GAUZE/BANDAGES/DRESSINGS) ×1
BENZOIN TINCTURE PRP APPL 2/3 (GAUZE/BANDAGES/DRESSINGS) ×3 IMPLANT
BLADE SAW SGTL 18X1.27X75 (BLADE) ×2 IMPLANT
BLADE SAW SGTL 18X1.27X75MM (BLADE) ×1
BLADE SURG ROTATE 9660 (MISCELLANEOUS) IMPLANT
CAPT HIP TOTAL 2 ×2 IMPLANT
CELLS DAT CNTRL 66122 CELL SVR (MISCELLANEOUS) ×1 IMPLANT
CLOSURE WOUND 1/2 X4 (GAUZE/BANDAGES/DRESSINGS) ×2
COVER SURGICAL LIGHT HANDLE (MISCELLANEOUS) ×3 IMPLANT
DRAPE C-ARM 42X72 X-RAY (DRAPES) ×3 IMPLANT
DRAPE STERI IOBAN 125X83 (DRAPES) ×3 IMPLANT
DRAPE U-SHAPE 47X51 STRL (DRAPES) ×9 IMPLANT
DRSG AQUACEL AG ADV 3.5X10 (GAUZE/BANDAGES/DRESSINGS) ×3 IMPLANT
DURAPREP 26ML APPLICATOR (WOUND CARE) ×3 IMPLANT
ELECT BLADE 4.0 EZ CLEAN MEGAD (MISCELLANEOUS) ×6
ELECT BLADE 6.5 EXT (BLADE) IMPLANT
ELECT REM PT RETURN 9FT ADLT (ELECTROSURGICAL) ×3
ELECTRODE BLDE 4.0 EZ CLN MEGD (MISCELLANEOUS) ×1 IMPLANT
ELECTRODE REM PT RTRN 9FT ADLT (ELECTROSURGICAL) ×1 IMPLANT
FACESHIELD WRAPAROUND (MASK) ×9 IMPLANT
FACESHIELD WRAPAROUND OR TEAM (MASK) ×2 IMPLANT
GAUZE XEROFORM 1X8 LF (GAUZE/BANDAGES/DRESSINGS) ×2 IMPLANT
GLOVE BIOGEL PI IND STRL 8 (GLOVE) ×2 IMPLANT
GLOVE BIOGEL PI INDICATOR 8 (GLOVE) ×4
GLOVE ECLIPSE 8.0 STRL XLNG CF (GLOVE) ×3 IMPLANT
GLOVE ORTHO TXT STRL SZ7.5 (GLOVE) ×6 IMPLANT
GOWN STRL REUS W/ TWL LRG LVL3 (GOWN DISPOSABLE) ×2 IMPLANT
GOWN STRL REUS W/ TWL XL LVL3 (GOWN DISPOSABLE) ×2 IMPLANT
GOWN STRL REUS W/TWL LRG LVL3 (GOWN DISPOSABLE) ×6
GOWN STRL REUS W/TWL XL LVL3 (GOWN DISPOSABLE) ×6
HANDPIECE INTERPULSE COAX TIP (DISPOSABLE) ×3
KIT BASIN OR (CUSTOM PROCEDURE TRAY) ×3 IMPLANT
KIT ROOM TURNOVER OR (KITS) ×3 IMPLANT
MANIFOLD NEPTUNE II (INSTRUMENTS) ×3 IMPLANT
NS IRRIG 1000ML POUR BTL (IV SOLUTION) ×3 IMPLANT
PACK TOTAL JOINT (CUSTOM PROCEDURE TRAY) ×3 IMPLANT
PAD ARMBOARD 7.5X6 YLW CONV (MISCELLANEOUS) ×3 IMPLANT
RETRACTOR WND ALEXIS 18 MED (MISCELLANEOUS) ×1 IMPLANT
RTRCTR WOUND ALEXIS 18CM MED (MISCELLANEOUS) ×3
SET HNDPC FAN SPRY TIP SCT (DISPOSABLE) ×1 IMPLANT
SPONGE LAP 18X18 X RAY DECT (DISPOSABLE) ×2 IMPLANT
STAPLER VISISTAT 35W (STAPLE) IMPLANT
STRIP CLOSURE SKIN 1/2X4 (GAUZE/BANDAGES/DRESSINGS) ×4 IMPLANT
SUT ETHIBOND NAB CT1 #1 30IN (SUTURE) ×3 IMPLANT
SUT MNCRL AB 4-0 PS2 18 (SUTURE) IMPLANT
SUT VIC AB 0 CT1 27 (SUTURE) ×3
SUT VIC AB 0 CT1 27XBRD ANBCTR (SUTURE) ×1 IMPLANT
SUT VIC AB 1 CT1 27 (SUTURE) ×3
SUT VIC AB 1 CT1 27XBRD ANBCTR (SUTURE) ×1 IMPLANT
SUT VIC AB 2-0 CT1 27 (SUTURE) ×3
SUT VIC AB 2-0 CT1 TAPERPNT 27 (SUTURE) ×1 IMPLANT
TOWEL OR 17X24 6PK STRL BLUE (TOWEL DISPOSABLE) ×3 IMPLANT
TOWEL OR 17X26 10 PK STRL BLUE (TOWEL DISPOSABLE) ×3 IMPLANT
TRAY FOLEY CATH 16FRSI W/METER (SET/KITS/TRAYS/PACK) IMPLANT
WATER STERILE IRR 1000ML POUR (IV SOLUTION) ×2 IMPLANT

## 2015-10-01 NOTE — Progress Notes (Signed)
Patient states that she has an allergy to Keflex that caused swelling and a rash. . Notified Dr. Ninfa Linden who gave verbal order to discontinue Ancef and give 1 gram of Vancomycin IV.

## 2015-10-01 NOTE — Anesthesia Postprocedure Evaluation (Signed)
Anesthesia Post Note  Patient: [name]  Procedure(s) Performed: Procedure(s) (LRB): RIGHT TOTAL HIP ARTHROPLASTY ANTERIOR APPROACH (Right)  Patient location during evaluation: PACU Anesthesia Type: General Level of consciousness: awake, awake and alert and oriented Pain management: pain level controlled Vital Signs Assessment: post-procedure vital signs reviewed and stable Respiratory status: spontaneous breathing, nonlabored ventilation and respiratory function stable Cardiovascular status: blood pressure returned to baseline Anesthetic complications: no    Last Vitals:  Vitals:   10/01/15 1234 10/01/15 1720  BP: (!) 154/78 101/64  Pulse: 66 66  Resp: 20 (!) 21  Temp: 36.1 C 36.1 C    Last Pain:  Vitals:   10/01/15 1820  TempSrc:   PainSc: 8     LLE Motor Response: Purposeful movement (10/01/15 1820)   RLE Motor Response: Purposeful movement (10/01/15 1820)   L Sensory Level: L5-Outer lower leg, top of foot, great toe (10/01/15 1820) R Sensory Level: L5-Outer lower leg, top of foot, great toe (10/01/15 1820)  Ahad Colarusso COKER

## 2015-10-01 NOTE — Anesthesia Procedure Notes (Signed)
Procedure Name: MAC Date/Time: 10/01/2015 3:30 PM Performed by: Izora Gala Pre-anesthesia Checklist: Patient identified, Emergency Drugs available, Suction available and Patient being monitored Patient Re-evaluated:Patient Re-evaluated prior to inductionOxygen Delivery Method: Simple face mask Intubation Type: IV induction Placement Confirmation: positive ETCO2

## 2015-10-01 NOTE — Transfer of Care (Signed)
Immediate Anesthesia Transfer of Care Note  Patient: [name]  Procedure(s) Performed: Procedure(s): RIGHT TOTAL HIP ARTHROPLASTY ANTERIOR APPROACH (Right)  Patient Location: PACU  Anesthesia Type:Spinal  Level of Consciousness: awake, alert , oriented and sedated  Airway & Oxygen Therapy: Patient Spontanous Breathing and Patient connected to nasal cannula oxygen  Post-op Assessment: Report given to RN, Post -op Vital signs reviewed and stable and Patient moving all extremities  Post vital signs: Reviewed and stable  Last Vitals:  Vitals:   10/01/15 1234  BP: (!) 154/78  Pulse: 66  Resp: 20  Temp: 36.1 C    Last Pain:  Vitals:   10/01/15 1234  TempSrc: Oral      Patients Stated Pain Goal: 5 ([id number])  Complications: No apparent anesthesia complications

## 2015-10-01 NOTE — Anesthesia Procedure Notes (Signed)
Spinal  Staffing Anesthesiologist: Jamilyn Pigeon Performed: anesthesiologist  Preanesthetic Checklist Completed: patient identified, site marked, surgical consent, pre-op evaluation, timeout performed, IV checked, risks and benefits discussed and monitors and equipment checked Spinal Block Patient position: sitting Prep: site prepped and draped and DuraPrep Patient monitoring: heart rate, continuous pulse ox and blood pressure Approach: midline Location: L4-5 Injection technique: single-shot Needle Needle type: Pencan  Needle gauge: 24 G Needle length: 9 cm     

## 2015-10-01 NOTE — Anesthesia Preprocedure Evaluation (Signed)
Anesthesia Evaluation  Patient identified by MRN, date of birth, ID band Patient awake    Reviewed: Allergy & Precautions, NPO status , Patient's Chart, lab work & pertinent test results  Airway Mallampati: II  TM Distance: >3 FB Neck ROM: Full    Dental   Pulmonary asthma , former smoker,    breath sounds clear to auscultation       Cardiovascular hypertension, Pt. on medications  Rhythm:Regular Rate:Normal     Neuro/Psych negative neurological ROS     GI/Hepatic negative GI ROS,   Endo/Other  negative endocrine ROS  Renal/GU negative Renal ROS     Musculoskeletal  (+) Arthritis ,   Abdominal   Peds  Hematology negative hematology ROS (+)   Anesthesia Other Findings   Reproductive/Obstetrics                             Lab Results  Component Value Date   WBC 5.9 09/25/2015   HGB 13.8 09/25/2015   HCT 42.5 09/25/2015   MCV 88.0 09/25/2015   PLT 186 09/25/2015   Lab Results  Component Value Date   CREATININE 0.63 09/25/2015   BUN 15 09/25/2015   NA 138 09/25/2015   K 3.7 09/25/2015   CL 107 09/25/2015   CO2 25 09/25/2015    Anesthesia Physical Anesthesia Plan  ASA: II  Anesthesia Plan: MAC and Spinal   Post-op Pain Management:    Induction: Intravenous  Airway Management Planned: Natural Airway and Simple Face Mask  Additional Equipment:   Intra-op Plan:   Post-operative Plan:   Informed Consent: I have reviewed the patients History and Physical, chart, labs and discussed the procedure including the risks, benefits and alternatives for the proposed anesthesia with the patient or authorized representative who has indicated his/her understanding and acceptance.     Plan Discussed with: CRNA  Anesthesia Plan Comments:         Anesthesia Quick Evaluation

## 2015-10-01 NOTE — Brief Op Note (Signed)
10/01/2015  4:55 PM  PATIENT:  Valora Corporal  70 y.o. female  PRE-OPERATIVE DIAGNOSIS:  Osteoarthritis right hip  POST-OPERATIVE DIAGNOSIS:  severe osteoarthritis right hip  PROCEDURE:  Procedure(s): RIGHT TOTAL HIP ARTHROPLASTY ANTERIOR APPROACH (Right)  SURGEON:  Surgeon(s) and Role:    * Mcarthur Rossetti, MD - Primary  PHYSICIAN ASSISTANT: Benita Stabile, PA-C  ANESTHESIA:   spinal  EBL:  Total I/O In: 1000 [I.V.:1000] Out: 350 [Urine:50; Blood:300]  COUNTS:  YES  DICTATION: .Other Dictation: Dictation Number [id number]  PLAN OF CARE: Admit to inpatient   PATIENT DISPOSITION:  PACU - hemodynamically stable.   Delay start of Pharmacological VTE agent (>24hrs) due to surgical blood loss or risk of bleeding: no

## 2015-10-01 NOTE — H&P (Signed)
TOTAL HIP ADMISSION H&P  Patient is admitted for right total hip arthroplasty.  Subjective:  Chief Complaint: right hip pain  HPI: [name], 70 y.o. female, has a history of pain and functional disability in the right hip(s) due to arthritis and patient has failed non-surgical conservative treatments for greater than 12 weeks to include NSAID's and/or analgesics, corticosteriod injections, flexibility and strengthening excercises, supervised PT with diminished ADL's post treatment, use of assistive devices, weight reduction as appropriate and activity modification.  Onset of symptoms was gradual starting 3 years ago with gradually worsening course since that time.The patient noted no past surgery on the right hip(s).  Patient currently rates pain in the right hip at 9 out of 10 with activity. Patient has night pain, worsening of pain with activity and weight bearing, pain that interfers with activities of daily living and pain with passive range of motion. Patient has evidence of subchondral cysts, subchondral sclerosis, periarticular osteophytes and joint space narrowing by imaging studies. This condition presents safety issues increasing the risk of falls.  There is no current active infection.  Patient Active Problem List   Diagnosis Date Noted  . Osteoarthritis of right hip 10/01/2015  . Right hip pain 08/24/2015  . Cellulitis 06/26/2010   Past Medical History:  Diagnosis Date  . Arthritis   . Asthma   . Hypertension   . Seasonal allergies     Past Surgical History:  Procedure Laterality Date  . ELBOW ARTHROSCOPY WITH TENDON RECONSTRUCTION    . FOOT SURGERY      Prescriptions Prior to Admission  Medication Sig Dispense Refill Last Dose  . lisinopril-hydrochlorothiazide (PRINZIDE,ZESTORETIC) 10-12.5 MG per tablet Take 1 tablet by mouth daily.     02/27/2013 at Unknown time   Allergies  Allergen Reactions  . Morphine And Related Other (See Comments)    Unknown reaction   . Tetracyclines & Related Other (See Comments)    Unknown reaction  . Keflex [Cephalexin] Rash    Social History  Substance Use Topics  . Smoking status: Former Smoker    Types: Cigarettes    Quit date: 01/05/1978  . Smokeless tobacco: Never Used  . Alcohol use No    Family History  Problem Relation Age of Onset  . Cancer Mother      Review of Systems  Musculoskeletal: Positive for joint pain.  All other systems reviewed and are negative.   Objective:  Physical Exam  Constitutional: She is oriented to person, place, and time. She appears well-developed and well-nourished.  HENT:  Head: Normocephalic and atraumatic.  Eyes: EOM are normal. Pupils are equal, round, and reactive to light.  Neck: Normal range of motion. Neck supple.  Cardiovascular: Normal rate and regular rhythm.   Respiratory: Effort normal and breath sounds normal.  GI: Soft. Bowel sounds are normal.  Musculoskeletal:       Right hip: She exhibits decreased range of motion, decreased strength, tenderness and bony tenderness.  Neurological: She is alert and oriented to person, place, and time.  Skin: Skin is warm and dry.  Psychiatric: She has a normal mood and affect.    Vital signs in last 24 hours:    Labs:   Estimated body mass index is 36.08 kg/m as calculated from the following:   Height as of 09/25/15: 5' 1.5" (1.562 m).   Weight as of 09/25/15: 88 kg (194 lb 1.6 oz).   Imaging Review Plain radiographs demonstrate severe degenerative joint disease of the right hip(s). The  bone quality appears to be good for age and reported activity level.  Assessment/Plan:  End stage arthritis, right hip(s)  The patient history, physical examination, clinical judgement of the provider and imaging studies are consistent with end stage degenerative joint disease of the right hip(s) and total hip arthroplasty is deemed medically necessary. The treatment options including medical management, injection  therapy, arthroscopy and arthroplasty were discussed at length. The risks and benefits of total hip arthroplasty were presented and reviewed. The risks due to aseptic loosening, infection, stiffness, dislocation/subluxation,  thromboembolic complications and other imponderables were discussed.  The patient acknowledged the explanation, agreed to proceed with the plan and consent was signed. Patient is being admitted for inpatient treatment for surgery, pain control, PT, OT, prophylactic antibiotics, VTE prophylaxis, progressive ambulation and ADL's and discharge planning.The patient is planning to be discharged home with home health services vs skilled nursing pending her post-operative recovery and mobility with therapy.

## 2015-10-02 ENCOUNTER — Encounter (HOSPITAL_COMMUNITY): Payer: Self-pay | Admitting: Orthopaedic Surgery

## 2015-10-02 LAB — BASIC METABOLIC PANEL
Anion gap: 8 (ref 5–15)
BUN: 11 mg/dL (ref 6–20)
CHLORIDE: 106 mmol/L (ref 101–111)
CO2: 22 mmol/L (ref 22–32)
Calcium: 8.7 mg/dL — ABNORMAL LOW (ref 8.9–10.3)
Creatinine, Ser: 0.56 mg/dL (ref 0.44–1.00)
GFR calc non Af Amer: >60 mL/min (ref >60)
Glucose, Bld: 109 mg/dL — ABNORMAL HIGH (ref 65–99)
POTASSIUM: 3.9 mmol/L (ref 3.5–5.1)
SODIUM: 136 mmol/L (ref 135–145)

## 2015-10-02 LAB — CBC
HCT: 33.8 % — ABNORMAL LOW (ref 36.0–46.0)
HEMOGLOBIN: 11 g/dL — AB (ref 12.0–15.0)
MCH: 28.4 pg (ref 26.0–34.0)
MCHC: 32.5 g/dL (ref 30.0–36.0)
MCV: 87.1 fL (ref 78.0–100.0)
Platelets: 146 10*3/uL — ABNORMAL LOW (ref 150–400)
RBC: 3.88 MIL/uL (ref 3.87–5.11)
RDW: 13.9 % (ref 11.5–15.5)
WBC: 6.8 10*3/uL (ref 4.0–10.5)

## 2015-10-02 MED ORDER — LISINOPRIL 10 MG PO TABS
10.0000 mg | ORAL_TABLET | Freq: Every day | ORAL | Status: DC
  Administered 2015-10-04: 10 mg via ORAL
  Filled 2015-10-02 (×3): qty 1

## 2015-10-02 NOTE — Progress Notes (Signed)
Physical Therapy Treatment Patient Details Name: [name] MRN: [id number] DOB: June 16, 1945 Today's Date: 10/02/2015    History of Present Illness Pt is a 70 y/o female s/p R THA (anterior approach). PMH including but not limited to HTN.    PT Comments    Pt presented sitting OOB in recliner when PT entered room. Pt's daughter was present throughout session as well. Pt was again very limited secondary to increased pain with standing and during ambulation. PT planning to continue further gait training and stair training at next visit. Pt would continue to benefit from skilled physical therapy services at this time while admitted and after d/c to address her limitations in order to improve her overall safety and independence with functional mobility.   Follow Up Recommendations  Home health PT;Supervision for mobility/OOB     Equipment Recommendations  Rolling walker with 5" wheels;3in1 (PT)    Recommendations for Other Services       Precautions / Restrictions Precautions Precautions: Fall Restrictions Weight Bearing Restrictions: Yes RLE Weight Bearing: Weight bearing as tolerated    Mobility  Bed Mobility Overal bed mobility: Needs Assistance Bed Mobility: Supine to Sit     Supine to sit: Mod assist;HOB elevated     General bed mobility comments: pt sitting OOB in recliner when PT entered room  Transfers Overall transfer level: Needs assistance Equipment used: Rolling walker (2 wheeled) Transfers: Sit to/from Stand Sit to Stand: Min guard Stand pivot transfers: Min assist       General transfer comment: pt required increased time and VC'ing for bilateral hand placement  Ambulation/Gait Ambulation/Gait assistance: Min guard Ambulation Distance (Feet): 3 Feet Assistive device: Rolling walker (2 wheeled) Gait Pattern/deviations: Step-to pattern;Decreased step length - left;Decreased stance time - right;Decreased weight shift to right;Trunk  flexed;Antalgic Gait velocity: decreased Gait velocity interpretation: Below normal speed for age/gender General Gait Details: pt limited secondary to significant pain in standing and upon WBing through R LE   Stairs            Wheelchair Mobility    Modified Rankin (Stroke Patients Only)       Balance Overall balance assessment: Needs assistance Sitting-balance support: Feet supported;No upper extremity supported Sitting balance-Leahy Scale: Fair     Standing balance support: During functional activity;Bilateral upper extremity supported Standing balance-Leahy Scale: Poor Standing balance comment: pt reliant on bilateral UEs on RW                    Cognition Arousal/Alertness: Awake/alert Behavior During Therapy: WFL for tasks assessed/performed Overall Cognitive Status: Within Functional Limits for tasks assessed                      Exercises Total Joint Exercises Ankle Circles/Pumps: AROM;Both;10 reps;Seated Quad Sets: AROM;Strengthening;Right;10 reps;Seated Heel Slides: AAROM;Right;10 reps;Seated Hip ABduction/ADduction: AAROM;Right;10 reps;Seated Straight Leg Raises: AAROM;Right;10 reps;Seated    General Comments        Pertinent Vitals/Pain Pain Assessment: Faces Faces Pain Scale: Hurts whole lot Pain Location: R hip and thigh Pain Descriptors / Indicators: Burning;Stabbing;Grimacing;Guarding;Moaning Pain Intervention(s): Monitored during session;Repositioned;Ice applied    Home Living Family/patient expects to be discharged to:: Private residence Living Arrangements: Alone;Other (Comment) (pt's daughter will be in town for approx. one week to assist) Available Help at Discharge: Family;Available 24 hours/day Type of Home: House Home Access: Stairs to enter Entrance Stairs-Rails: Can reach both Home Layout: Two level Home Equipment: Cane - single point      Prior  Function Level of Independence: Independent      Comments: pt  stated that she occasionally uses a cane when outside   PT Goals (current goals can now be found in the care plan section) Acute Rehab PT Goals Patient Stated Goal: return home PT Goal Formulation: With patient Time For Goal Achievement: 10/09/15 Potential to Achieve Goals: Good Progress towards PT goals: Progressing toward goals    Frequency    7X/week      PT Plan Current plan remains appropriate    Co-evaluation             End of Session Equipment Utilized During Treatment: Gait belt Activity Tolerance: Patient limited by pain Patient left: in chair;with call bell/phone within reach;with family/visitor present;with SCD's reapplied     Time: [id number] PT Time Calculation (min) (ACUTE ONLY): 19 min  Charges:  $Therapeutic Exercise: 8-22 mins                    G CodesClearnce Sorrel Daishia Fetterly 15-Oct-2015, 2:20 PM  Sherie Don, [place], DPT [phone]

## 2015-10-02 NOTE — Op Note (Signed)
NAMEAFIYAH, RACHFORD NO.:  [id number]  MEDICAL RECORD NO.:  [id number]  LOCATION:  [id number]                        FACILITY:  [facility]  PHYSICIAN:  Lind Guest. Ninfa Linden, M.D.DATE OF BIRTH:  1945/03/28  DATE OF PROCEDURE:  10/01/2015 DATE OF DISCHARGE:                              OPERATIVE REPORT   PREOPERATIVE DIAGNOSIS:  Primary osteoarthritis and degenerative joint disease, right hip.  POSTOPERATIVE DIAGNOSIS:  Primary osteoarthritis and degenerative joint disease, right hip.  PROCEDURE:  Right total hip arthroplasty through direct anterior approach.  IMPLANTS:  DePuy Sector Gription acetabular component size 52, size 36+ 0 polyethylene liner, size 11 Corail femoral component with standard offset, size 36+ 8.5 ceramic hip ball.  SURGEON:  Mcarthur Rossetti, M.D.  ASSISTANT:  Erskine Emery, PA-C.  ANESTHESIA:  Spinal.  ANTIBIOTICS:  1 g of IV vancomycin.  BLOOD LOSS:  300-350 mL.  COMPLICATIONS:  None.  INDICATIONS:  Ms. Koenigsberg is a 70 year old patient well known to me.  We have seen her for sometime now and she has debilitating arthritis involving her right hip.  She has tried and failed all forms of conservative treatment and at this point, it has been recommended that she undergo a right total hip arthroplasty.  This was due to her daily pain, her decreased mobility and decreased quality of life and activities that is related to her hip pain.  She understands our goals are decreased pain, improved mobility, and overall improved quality of life.  She understands the risk of acute blood loss anemia, nerve and vessel injury, fracture, infection, dislocation, and DVT.  PROCEDURE DESCRIPTION:  After informed consent was obtained, appropriate right hip was marked.  She was brought to the operating room and spinal anesthesia was obtained while she was on her stretcher.  She was laid in a supine position on the stretcher.  A Foley catheter was  placed.  Then, both feet had traction boots applied to them.  Next, she was placed supine on the Hana fracture table with a perineal post in place and both legs in inline skeletal traction devices but no traction applied.  Her right operative hip was then prepped and draped with DuraPrep and sterile drapes.  Time-out was called.  She was identified as correct patient and correct right hip.  I then made an incision inferior and posterior to the anterior-superior iliac spine and carried this obliquely down the leg.  We dissected down the tensor fascia lata muscle.  The tensor fascia was then divided longitudinally to proceed with a direct anterior approach to the hip.  We identified and cauterized the circumflex vessels and then identified the hip capsule. I opened up the hip capsule finding a very large joint effusion and significant periarticular osteophytes.  We placed Cobra retractors around the medial and lateral femoral neck and then made our femoral neck cut with an oscillating saw proximal to the lesser trochanter.  We completed this with an osteotome.  I placed a corkscrew guide in the femoral head and removed the femoral head in its entirety and found it to be completely devoid of cartilage.  We then cleaned the acetabulum, remnants of acetabular labrum and other periarticular osteophytes.  We  placed a bent Hohmann over the medial acetabular rim and began reaming from a size 42 reamer up to a size 52 with all reamers under direct visualization and the last reamer under direct fluoroscopy, so I could obtain my depth of reaming, our inclination, and anteversion.  I then placed the real DePuy Sector Gription acetabular component size 52 and a 36+ 0 polyethylene liner for that size acetabular component.  Attention was then turned to the femur.  With the leg externally rotated to 100 degrees, extended and adducted, we were able to place a Mueller retractor medially and a Hohmann  retractor behind the greater trochanter.  We released the lateral joint capsule, used a box cutting osteotome to enter the femoral canal and a rongeur to lateralize.  I then began broaching from a size 8 broach using the Corail broaching system up to a size 11.  With the size 11 have been tight fit we trialed a standard offset femoral neck and 36+ 1.5 hip ball, reduced this in acetabulum and we did recognize that she was still quite short and needed more offset.  This was related to both medialization of the acetabular component and the lower femoral neck cut.  With that being said, we then placed the real Corail femoral component size 11 with standard offset and then the real 36+ 8.5 hip ball, reduced this in the acetabulum, we were pleased with the leg length, offset, and range of motion as well as stability on stressing the hip.  We then irrigated the soft tissue with normal saline solution.  We closed the joint capsule with interrupted #1 Ethibond suture followed by running #1 Vicryl in the tensor fascia, 0 Vicryl in the deep tissue, 2-0 Vicryl in subcutaneous tissue, interrupted staples on the skin.  Xeroform and well-padded sterile dressing was applied.  She was taken off the Hana table, taken to the recovery room in stable condition.  All final counts were correct.  There were no complications noted.  Of note, Erskine Emery, PA- C assisted in the entire case.  His assistance was crucial for facilitating all aspects of this case.     Lind Guest. Ninfa Linden, M.D.     CYB/MEDQ  D:  10/01/2015  T:  10/02/2015  Job:  [id number]

## 2015-10-02 NOTE — Progress Notes (Signed)
Subjective: 1 Day Post-Op Procedure(s) (LRB): RIGHT TOTAL HIP ARTHROPLASTY ANTERIOR APPROACH (Right) Patient reports pain as moderate.  Vitals and labs stable.  Objective: Vital signs in last 24 hours: Temp:  [97 F (36.1 C)-99 F (37.2 C)] 98.8 F (37.1 C) (09/27 0500) Pulse Rate:  [57-88] 88 (09/27 0500) Resp:  [14-30] 17 (09/27 0500) BP: (88-154)/(55-92) 130/66 (09/27 0500) SpO2:  [96 %-100 %] 96 % (09/27 0500) Weight:  [88 kg (194 lb)] 88 kg (194 lb) (09/26 1234)  Intake/Output from previous day: 09/26 0701 - 09/27 0700 In: 2100 [I.V.:2000; IV Piggyback:100] Out: 925 [Urine:625; Blood:300] Intake/Output this shift: No intake/output data recorded.   Recent Labs  10/02/15 0533  HGB 11.0*    Recent Labs  10/02/15 0533  WBC 6.8  RBC 3.88  HCT 33.8*  PLT 146*    Recent Labs  10/02/15 0533  NA 136  K 3.9  CL 106  CO2 22  BUN 11  CREATININE 0.56  GLUCOSE 109*  CALCIUM 8.7*   No results for input(s): LABPT, INR in the last 72 hours.  Sensation intact distally Intact pulses distally Dorsiflexion/Plantar flexion intact Incision: dressing C/D/I  Assessment/Plan: 1 Day Post-Op Procedure(s) (LRB): RIGHT TOTAL HIP ARTHROPLASTY ANTERIOR APPROACH (Right) Up with therapy  [name] 10/02/2015, 7:09 AM

## 2015-10-02 NOTE — Evaluation (Signed)
Physical Therapy Evaluation Patient Details Name: ANNALYSSE FIGGERS MRN: [id number] DOB: 1945-04-01 Today's Date: 10/02/2015   History of Present Illness  Pt is a 70 y/o female s/p R THA (anterior approach). PMH including but not limited to HTN.  Clinical Impression  Pt presented supine in bed with HOB elevated, awake and willing to participate in therapy session. Prior to admission, pt stated that she was independent with all functional mobility with the exception of occasional use of SPC when outside. Pt was limited during evaluation secondary to increased pain in R hip and R wrist (IV location). PT planning to perform gait training at next visit. Pt would continue to benefit from skilled physical therapy services at this time while admitted and after d/c to address her below listed limitations in order to improve her overall safety and independence with functional mobility.     Follow Up Recommendations Home health PT;Supervision for mobility/OOB    Equipment Recommendations  Rolling walker with 5" wheels;3in1 (PT)    Recommendations for Other Services       Precautions / Restrictions Precautions Precautions: Fall Restrictions Weight Bearing Restrictions: Yes RLE Weight Bearing: Weight bearing as tolerated      Mobility  Bed Mobility Overal bed mobility: Needs Assistance Bed Mobility: Supine to Sit     Supine to sit: Mod assist;HOB elevated     General bed mobility comments: pt required increased time, use of bed rails and mod A with movement of R LE and upper body to achieve full sitting EOB.  Transfers Overall transfer level: Needs assistance Equipment used: Rolling walker (2 wheeled) Transfers: Sit to/from [facility] Sit to Stand: Min guard Stand pivot transfers: Min assist       General transfer comment: pt required increased time, VC'ing for bilateral hand placement and min A with movement of RW during SPT  Ambulation/Gait              General Gait Details: did not occur secondary to increased pain in standing and pt very anxious when performing STS and SPT  Stairs            Wheelchair Mobility    Modified Rankin (Stroke Patients Only)       Balance Overall balance assessment: Needs assistance Sitting-balance support: Feet supported;No upper extremity supported Sitting balance-Leahy Scale: Fair     Standing balance support: During functional activity;Bilateral upper extremity supported Standing balance-Leahy Scale: Poor Standing balance comment: pt reliant on bilateral UEs on RW                             Pertinent Vitals/Pain Pain Assessment: Faces Faces Pain Scale: Hurts even more Pain Location: R hip and R wrist (IV location) Pain Descriptors / Indicators: Burning;Grimacing;Guarding;Moaning Pain Intervention(s): Monitored during session;Repositioned    Home Living Family/patient expects to be discharged to:: Private residence Living Arrangements: Alone;Other (Comment) (pt's daughter will be in town for approx. one week to assist) Available Help at Discharge: Family;Available 24 hours/day Type of Home: House Home Access: Stairs to enter Entrance Stairs-Rails: Can reach both Entrance Stairs-Number of Steps: 4 Home Layout: Two level Home Equipment: Cane - single point      Prior Function Level of Independence: Independent         Comments: pt stated that she occasionally uses a cane when outside     Hand Dominance        Extremity/Trunk Assessment   Upper Extremity Assessment:  Defer to OT evaluation           Lower Extremity Assessment: RLE deficits/detail RLE Deficits / Details: pt with decreased strength and ROM limitations secondary to post-op.       Communication   Communication: No difficulties  Cognition Arousal/Alertness: Awake/alert Behavior During Therapy: WFL for tasks assessed/performed Overall Cognitive Status: Within Functional Limits for  tasks assessed                      General Comments      Exercises Total Joint Exercises Ankle Circles/Pumps: AROM;Both;10 reps;Seated Quad Sets: AROM;Strengthening;Right;10 reps;Seated Hip ABduction/ADduction: AAROM;Right;10 reps;Seated   Assessment/Plan    PT Assessment Patient needs continued PT services  PT Problem List Decreased strength;Decreased range of motion;Decreased activity tolerance;Decreased balance;Decreased mobility;Decreased coordination;Decreased knowledge of use of DME;Pain          PT Treatment Interventions DME instruction;Gait training;Stair training;Functional mobility training;Therapeutic activities;Therapeutic exercise;Balance training;Neuromuscular re-education;Patient/family education    PT Goals (Current goals can be found in the Care Plan section)  Acute Rehab PT Goals Patient Stated Goal: return home PT Goal Formulation: With patient Time For Goal Achievement: 10/09/15 Potential to Achieve Goals: Good    Frequency 7X/week   Barriers to discharge        Co-evaluation               End of Session Equipment Utilized During Treatment: Gait belt Activity Tolerance: Patient limited by pain;Patient limited by fatigue Patient left: in chair;with call bell/phone within reach;with SCD's reapplied Nurse Communication: Mobility status         Time: 1030-1045 PT Time Calculation (min) (ACUTE ONLY): 15 min   Charges:   PT Evaluation $PT Eval Moderate Complexity: 1 Procedure     PT G CodesClearnce Sorrel Cecil Vandyke 10/02/2015, 12:36 PM Sherie Don, [place], DPT [phone]

## 2015-10-02 NOTE — Evaluation (Signed)
Occupational Therapy Evaluation Patient Details Name: [name] MRN: [id number] DOB: 09/12/1945 Today's Date: 10/02/2015    History of Present Illness Pt is a 70 y/o female s/p R THA (anterior approach). PMH including but not limited to HTN.   Clinical Impression   PTA independent in ADL and IADL. Pt presenting with below deficits. Pt will benefit from skilled OT intervention in the acute care setting to increase indpendence in self-care tasks. Pt educated in use of AE and 3 in 1 in multiple home environments.  Next session to focus on increasing standing time at the sink for grooming.     Follow Up Recommendations  No OT follow up;Supervision/Assistance - 24 hour    Equipment Recommendations  3 in 1 bedside comode    Recommendations for Other Services       Precautions / Restrictions Precautions Precautions: Fall Restrictions Weight Bearing Restrictions: Yes RLE Weight Bearing: Weight bearing as tolerated      Mobility Bed Mobility               General bed mobility comments: pt sitting OOB in recliner with OT entered room  Transfers Overall transfer level: Needs assistance Equipment used: Rolling walker (2 wheeled) Transfers: Sit to/from [facility] Sit to Stand: Min guard Stand pivot transfers: Min assist       General transfer comment: Pt with min assist initail transfer from recliner to [facility] due to urgency, afterwards transfers were slower and more purposeful with Pt requiring vc for safe hand placement    Balance Overall balance assessment: Needs assistance Sitting-balance support: Feet supported Sitting balance-Leahy Scale: Good     Standing balance support: During functional activity Standing balance-Leahy Scale: Poor Standing balance comment: pt reliant on bilateral UEs on RW                            ADL Overall ADL's : Needs assistance/impaired Eating/Feeding: Set up;Sitting   Grooming: Wash/dry  hands;Wash/dry face;Oral care;Set up;Sitting               Lower Body Dressing: Maximal assistance;With caregiver independent assisting;Sit to/from stand Lower Body Dressing Details (indicate cue type and reason): educated in AE Medical illustrator, sock donner, shoe horn) Toilet Transfer: Minimal assistance;Stand-pivot;BSC;RW   Toileting- Clothing Manipulation and Hygiene: Min guard       Functional mobility during ADLs: Minimal assistance;Rolling walker General ADL Comments: very painful for Pt to bear weight through RLE, no pain in sitting and so able to perform ADL. Pt open and recieved AE education and 3 in 1 multiple uses with demonstration.     Vision     Perception     Praxis      Pertinent Vitals/Pain Pain Assessment: 0-10 Pain Score: 10-Worst pain ever Faces Pain Scale: Hurts whole lot Pain Location: R hip and thigh Pain Descriptors / Indicators: Burning;Aching;Grimacing;Guarding;Sore Pain Intervention(s): Monitored during session;Repositioned     Hand Dominance Right   Extremity/Trunk Assessment Upper Extremity Assessment Upper Extremity Assessment: Overall WFL for tasks assessed   Lower Extremity Assessment Lower Extremity Assessment: RLE deficits/detail RLE Deficits / Details: pt with decreased strength and ROM limitations secondary to post-op.       Communication Communication Communication: No difficulties   Cognition Arousal/Alertness: Awake/alert Behavior During Therapy: WFL for tasks assessed/performed Overall Cognitive Status: Within Functional Limits for tasks assessed  General Comments       Exercises Exercises: Total Joint     Shoulder Instructions      Home Living Family/patient expects to be discharged to:: Private residence Living Arrangements: Alone;Other (Comment) (pt's daughter will be in town for approx. one week to assist) Available Help at Discharge: Family;Available 24 hours/day Type of Home:  House Home Access: Stairs to enter [employer] of Steps: 4 Entrance Stairs-Rails: Can reach both Home Layout: Two level Alternate Level Stairs-Number of Steps: flight   Bathroom Shower/Tub: Occupational psychologist: Standard Bathroom Accessibility: Yes How Accessible: Accessible via walker Home Equipment: [place] - quad;Other (comment);Crutches;Shower seat - built in;Hand held shower head;Grab bars - tub/shower (hurry cane)          Prior Functioning/Environment Level of Independence: Independent        Comments: pt stated that she occasionally uses a cane when outside        OT Problem List: Decreased strength;Decreased range of motion;Decreased activity tolerance;Impaired balance (sitting and/or standing);Decreased knowledge of use of DME or AE;Pain   OT Treatment/Interventions: Self-care/ADL training;Therapeutic exercise;DME and/or AE instruction;Therapeutic activities;Patient/family education;Balance training    OT Goals(Current goals can be found in the care plan section) Acute Rehab OT Goals Patient Stated Goal: return home OT Goal Formulation: With patient Time For Goal Achievement: 10/11/15 Potential to Achieve Goals: Good ADL Goals Pt Will Perform Grooming: with supervision;standing Pt Will Perform Lower Body Dressing: with modified independence;sit to/from stand Pt Will Transfer to Toilet: with modified independence;bedside commode Pt Will Perform Toileting - Clothing Manipulation and hygiene: with modified independence;sit to/from stand Pt Will Perform Tub/Shower Transfer: Shower transfer;with supervision;rolling walker;ambulating  OT Frequency: Min 2X/week   Barriers to D/C:            Co-evaluation              End of Session Equipment Utilized During Treatment: Rolling walker;Gait belt Nurse Communication: Precautions  Activity Tolerance: Patient tolerated treatment well Patient left: in chair;with call bell/phone within  reach;Other (comment) (with housekeeping in room)   Time: 1530-1602 OT Time Calculation (min): 32 min Charges:  OT General Charges $OT Visit: 1 Procedure OT Evaluation $OT Eval Low Complexity: 1 Procedure OT Treatments $Self Care/Home Management : 8-22 mins G-Codes:    [name] [name] October 17, 2015, 4:08 PM  Hulda Humphrey OTR/L [phone]

## 2015-10-03 MED ORDER — OXYCODONE-ACETAMINOPHEN 5-325 MG PO TABS: 1.0000 | ORAL_TABLET | ORAL | 0 refills | Status: DC | PRN

## 2015-10-03 MED ORDER — METHOCARBAMOL 500 MG PO TABS: 500.0000 mg | ORAL_TABLET | Freq: Four times a day (QID) | ORAL | 0 refills | Status: DC | PRN

## 2015-10-03 MED ORDER — ASPIRIN 81 MG PO CHEW: 81.0000 mg | CHEWABLE_TABLET | Freq: Two times a day (BID) | ORAL | 0 refills | Status: DC

## 2015-10-03 NOTE — Progress Notes (Signed)
Physical Therapy Treatment Patient Details Name: [name] MRN: [id number] DOB: 08-15-1945 Today's Date: 10/03/2015    History of Present Illness Pt is a 70 y/o female s/p R THA (anterior approach). PMH including but not limited to HTN.    PT Comments    Pt presented sitting OOB in recliner when PT entered room. Pt making steady progress towards achieving her functional goals. PT planning to stair train at next visit if appropriate. Pt would continue to benefit from skilled physical therapy services at this time while admitted and after d/c to address her limitations in order to improve her overall safety and independence with functional mobility.   Follow Up Recommendations  Home health PT;Supervision for mobility/OOB     Equipment Recommendations  Rolling walker with 5" wheels;3in1 (PT)    Recommendations for Other Services       Precautions / Restrictions Precautions Precautions: Fall Restrictions Weight Bearing Restrictions: Yes RLE Weight Bearing: Weight bearing as tolerated    Mobility  Bed Mobility               General bed mobility comments: pt sitting OOB in recliner when PT entered room  Transfers Overall transfer level: Needs assistance Equipment used: Rolling walker (2 wheeled) Transfers: Sit to/from [facility] Sit to Stand: Min guard Stand pivot transfers: Min guard       General transfer comment: pt required increased time and VC'ing for bilateral hand placement. pt with failed initial attempt to stand from recliner, but with [place] for anterior trunk lean and bilateral hand placement, pt successfully able to stand from recliner with min guard for safety  Ambulation/Gait Ambulation/Gait assistance: Min guard Ambulation Distance (Feet): 20 Feet Assistive device: Rolling walker (2 wheeled) Gait Pattern/deviations: Step-to pattern;Decreased step length - left;Decreased stance time - right;Decreased weight shift to  right;Antalgic;Trunk flexed Gait velocity: decreased Gait velocity interpretation: Below normal speed for age/gender     Stairs            Wheelchair Mobility    Modified Rankin (Stroke Patients Only)       Balance Overall balance assessment: Needs assistance Sitting-balance support: Feet supported;No upper extremity supported Sitting balance-Leahy Scale: Fair     Standing balance support: During functional activity;Bilateral upper extremity supported Standing balance-Leahy Scale: Poor Standing balance comment: pt reliant on bilateral UEs on RW                    Cognition Arousal/Alertness: Awake/alert Behavior During Therapy: WFL for tasks assessed/performed Overall Cognitive Status: Within Functional Limits for tasks assessed                      Exercises Total Joint Exercises Quad Sets: AROM;Strengthening;Right;10 reps;Seated Long Arc Quad: AROM;Strengthening;Right;10 reps;Seated Marching in Standing: AAROM;Right;10 reps;Seated    General Comments        Pertinent Vitals/Pain Pain Assessment: Faces Faces Pain Scale: Hurts little more Pain Location: R hip and lateral thigh Pain Descriptors / Indicators: Burning;Grimacing;Guarding;Moaning Pain Intervention(s): Monitored during session;Repositioned;Ice applied    Home Living                      Prior Function            PT Goals (current goals can now be found in the care plan section) Acute Rehab PT Goals Patient Stated Goal: return home PT Goal Formulation: With patient Time For Goal Achievement: 10/09/15 Potential to Achieve Goals: Good Progress towards PT  goals: Progressing toward goals    Frequency    7X/week      PT Plan Current plan remains appropriate    Co-evaluation             End of Session Equipment Utilized During Treatment: Gait belt Activity Tolerance: Patient limited by pain Patient left: in chair;with call bell/phone within reach      Time: 0948-1006 PT Time Calculation (min) (ACUTE ONLY): 18 min  Charges:  $Gait Training: 8-22 mins                    G CodesClearnce Sorrel Brittony Billick 2015-10-14, 10:33 AM Sherie Don, PT, DPT [phone]

## 2015-10-03 NOTE — Progress Notes (Signed)
Occupational Therapy Treatment Patient Details Name: [name] MRN: [id number] DOB: 10-31-1945 Today's Date: 10/03/2015    History of present illness Pt is a 70 y/o female s/p R THA (anterior approach). PMH including but not limited to HTN.   OT comments  Pt with decreased pain today with increased independence in self-care tasks. Pt continues to make progress towards goals (please see deficit list below for current ability level), and is at a level to be safe to d/c home with daughter and husband support.    Follow Up Recommendations  No OT follow up;Supervision/Assistance - 24 hour    Equipment Recommendations  3 in 1 bedside comode    Recommendations for Other Services      Precautions / Restrictions Precautions Precautions: Fall Restrictions Weight Bearing Restrictions: Yes RLE Weight Bearing: Weight bearing as tolerated       Mobility Bed Mobility               General bed mobility comments: Pt sitting OOB in recliner when OT entered room  Transfers Overall transfer level: Needs assistance Equipment used: Rolling walker (2 wheeled) Transfers: Sit to/from Stand Sit to Stand: Min guard Stand pivot transfers: Min guard       General transfer comment: min guard for safety. Good hand placement.    Balance Overall balance assessment: Needs assistance Sitting-balance support: Feet supported;No upper extremity supported Sitting balance-Leahy Scale: Good     Standing balance support: During functional activity;Single extremity supported Standing balance-Leahy Scale: Poor Standing balance comment: standing at sink, one elbow on sink                   ADL Overall ADL's : Needs assistance/impaired     Grooming: Wash/dry hands;Wash/dry face;Min guard;Standing Grooming Details (indicate cue type and reason): at sink     Lower Body Bathing: Supervison/ safety;Sit to/from stand Lower Body Bathing Details (indicate cue type and reason):  re-educated about use of long handle sponge         Toilet Transfer: Min guard;Ambulation;RW;Comfort height toilet;Grab bars   Toileting- Clothing Manipulation and Hygiene: Min guard   Tub/ Banker: Gaffer;Ambulation;Rolling walker;Min guard (expressed that she should have someone for safety assist)   Functional mobility during ADLs: Min guard;Rolling walker General ADL Comments: Pt much better today. exposed to AE again today, Pt with no OT concerns going home      Vision                     Perception     Praxis      Cognition   Behavior During Therapy: [facility] for tasks assessed/performed Overall Cognitive Status: Within Functional Limits for tasks assessed                       Extremity/Trunk Assessment               Exercises Total Joint Exercises Quad Sets: AROM;Strengthening;Right;10 reps;Seated Long Arc Quad: AROM;Strengthening;Right;10 reps;Seated Marching in Standing: AAROM;Right;10 reps;Seated   Shoulder Instructions       General Comments      Pertinent Vitals/ Pain       Pain Assessment: Faces Faces Pain Scale: Hurts little more Pain Location: R hip, but main pain in lateral thigh above knee Pain Descriptors / Indicators: Cramping;Discomfort;Burning;Grimacing;Guarding Pain Intervention(s): Monitored during session;Repositioned  Home Living  Prior Functioning/Environment              Frequency  Min 2X/week        Progress Toward Goals  OT Goals(current goals can now be found in the care plan section)  Progress towards OT goals: Progressing toward goals  Acute Rehab OT Goals Patient Stated Goal: return home OT Goal Formulation: With patient Time For Goal Achievement: 10/11/15 Potential to Achieve Goals: Good ADL Goals Pt Will Perform Grooming: with supervision;standing Pt Will Transfer to Toilet: with modified independence;bedside  commode Pt Will Perform Toileting - Clothing Manipulation and hygiene: with modified independence;sit to/from stand Pt Will Perform Tub/Shower Transfer: Shower transfer;with supervision;rolling walker;ambulating  Plan Discharge plan remains appropriate    Co-evaluation                 End of Session Equipment Utilized During Treatment: Gait belt;Rolling walker   Activity Tolerance Patient tolerated treatment well   Patient Left in chair;with call bell/phone within reach   Nurse Communication Precautions        Time: 1010-1036 OT Time Calculation (min): 26 min  Charges: OT General Charges $OT Visit: 1 Procedure OT Treatments $Self Care/Home Management : 23-37 mins  Jaci Carrel 10/03/2015, 1:56 PM Hulda Humphrey OTR/L [phone]

## 2015-10-03 NOTE — Progress Notes (Signed)
Subjective: 2 Days Post-Op Procedure(s) (LRB): RIGHT TOTAL HIP ARTHROPLASTY ANTERIOR APPROACH (Right) Patient reports pain as moderate.    Objective: Vital signs in last 24 hours: Temp:  [98.2 F (36.8 C)-99 F (37.2 C)] 98.5 F (36.9 C) (09/28 0455) Pulse Rate:  [78-94] 92 (09/28 0455) BP: (108-138)/(56-70) 122/64 (09/28 0455) SpO2:  [94 %-100 %] 96 % (09/28 0455)  Intake/Output from previous day: 09/27 0701 - 09/28 0700 In: 292.5 [I.V.:292.5] Out: -  Intake/Output this shift: No intake/output data recorded.   Recent Labs  10/02/15 0533  HGB 11.0*    Recent Labs  10/02/15 0533  WBC 6.8  RBC 3.88  HCT 33.8*  PLT 146*    Recent Labs  10/02/15 0533  NA 136  K 3.9  CL 106  CO2 22  BUN 11  CREATININE 0.56  GLUCOSE 109*  CALCIUM 8.7*   No results for input(s): LABPT, INR in the last 72 hours.  Sensation intact distally Intact pulses distally Dorsiflexion/Plantar flexion intact Incision: scant drainage  Assessment/Plan: 2 Days Post-Op Procedure(s) (LRB): RIGHT TOTAL HIP ARTHROPLASTY ANTERIOR APPROACH (Right) Up with therapy Plan for discharge tomorrow Discharge home with home health  Mcarthur Rossetti 10/03/2015, 6:58 AM

## 2015-10-03 NOTE — Progress Notes (Signed)
Physical Therapy Treatment Patient Details Name: [name] MRN: [id number] DOB: 1945/11/27 Today's Date: 10/03/2015    History of Present Illness Pt is a 70 y/o female s/p R THA (anterior approach). PMH including but not limited to HTN.    PT Comments    Pt presented sitting OOB in recliner when PT entered room. Pt's daughter was present throughout session. Pt making good progress towards achieving her functional goals and successfully completed stair training this session. Pt would continue to benefit from skilled physical therapy services at this time while admitted and after d/c to address her limitations in order to improve her overall safety and independence with functional mobility.   Follow Up Recommendations  Home health PT;Supervision for mobility/OOB     Equipment Recommendations  Rolling walker with 5" wheels;3in1 (PT)    Recommendations for Other Services       Precautions / Restrictions Precautions Precautions: Fall Restrictions Weight Bearing Restrictions: Yes RLE Weight Bearing: Weight bearing as tolerated    Mobility  Bed Mobility               General bed mobility comments: Pt sitting OOB in recliner when PT entered room  Transfers Overall transfer level: Needs assistance Equipment used: Rolling walker (2 wheeled) Transfers: Sit to/from Stand Sit to Stand: Min guard         General transfer comment: pt required increased time, VC'ing for bilateral hand placement and min guard for safety  Ambulation/Gait Ambulation/Gait assistance: Min guard Ambulation Distance (Feet): 15 Feet Assistive device: Rolling walker (2 wheeled) Gait Pattern/deviations: Step-to pattern;Decreased step length - left;Decreased stance time - right;Decreased weight shift to right Gait velocity: decreased Gait velocity interpretation: Below normal speed for age/gender     Stairs Stairs: Yes Stairs assistance: Min guard Stair Management: Two  rails;Forwards;Step to pattern Number of Stairs: 2 General stair comments: pt ascended stairs with L LE leading and descended with R LE leading  Wheelchair Mobility    Modified Rankin (Stroke Patients Only)       Balance Overall balance assessment: Needs assistance Sitting-balance support: Feet supported;No upper extremity supported Sitting balance-Leahy Scale: Good     Standing balance support: During functional activity;Bilateral upper extremity supported Standing balance-Leahy Scale: Poor Standing balance comment: pt reliant on bilateral UEs on RW                    Cognition Arousal/Alertness: Awake/alert Behavior During Therapy: WFL for tasks assessed/performed Overall Cognitive Status: Within Functional Limits for tasks assessed                      Exercises Total Joint Exercises Quad Sets: AROM;Strengthening;Right;10 reps;Seated Heel Slides: AAROM;Right;10 reps;Seated Hip ABduction/ADduction: AAROM;Right;10 reps;Seated    General Comments        Pertinent Vitals/Pain Pain Assessment: Faces Faces Pain Scale: Hurts a little bit Pain Location: R thigh Pain Descriptors / Indicators: Burning;Discomfort;Guarding Pain Intervention(s): Monitored during session;Repositioned;Ice applied    Home Living                      Prior Function            PT Goals (current goals can now be found in the care plan section) Acute Rehab PT Goals Patient Stated Goal: return home PT Goal Formulation: With patient Time For Goal Achievement: 10/09/15 Potential to Achieve Goals: Good Progress towards PT goals: Progressing toward goals    Frequency    7X/week  PT Plan Current plan remains appropriate    Co-evaluation             End of Session Equipment Utilized During Treatment: Gait belt Activity Tolerance: Patient limited by pain Patient left: in chair;with call bell/phone within reach;with family/visitor present     Time:  [id number] PT Time Calculation (min) (ACUTE ONLY): 25 min  Charges:  $Gait Training: 8-22 mins $Therapeutic Exercise: 8-22 mins                    G CodesClearnce Sorrel Rafel Garde 22-Oct-2015, 5:45 PM [name], [place], DPT [phone]

## 2015-10-03 NOTE — Discharge Instructions (Signed)

## 2015-10-03 NOTE — Care Management Note (Signed)
Case Management Note  Patient Details  Name: [name] MRN: [id number] Date of Birth: 09-02-1945  Subjective/Objective:  70 yr old female s/p left total hip arthroplasty.                   Action/Plan: Case manager spoke with patient concerning [place] and DME. Patient was preoperatively setup with [facility], no changes. DME has been ordered. She will have family support at discharge.    Expected Discharge Date:    10/03/15              Expected Discharge Plan:  [place]  In-House Referral:     Discharge planning Services  CM Consult  Post Acute Care Choice:  Durable Medical Equipment, Home Health Choice offered to:  Patient  DME Arranged:  3-N-1, Walker rolling DME Agency:     HH Arranged:  PT [place]:  [facility] (now [facility])  Status of Service:  Completed, signed off  If discussed at [employer] of Stay Meetings, dates discussed:    Additional Comments:  Ninfa Meeker, RN 10/03/2015, 2:34 PM

## 2015-10-04 NOTE — Progress Notes (Signed)
Case Management: DME  Pt has RW and 3n1 in room. Jonnie Finner RN CCM Case Mgmt phone [phone]

## 2015-10-04 NOTE — Discharge Summary (Signed)
Patient ID: [name] MRN: [id number] DOB/AGE: [birth year] 70 y.o.  Admit date: 10/01/2015 Discharge date: 10/04/2015  Admission Diagnoses:  Principal Problem:   Osteoarthritis of right hip Active Problems:   Status post total replacement of right hip   Discharge Diagnoses:  Same  Past Medical History:  Diagnosis Date  . Arthritis   . Asthma   . Hypertension   . Seasonal allergies     Surgeries: Procedure(s): RIGHT TOTAL HIP ARTHROPLASTY ANTERIOR APPROACH on 10/01/2015   Consultants:   Discharged Condition: Improved  Hospital Course: [name] is an 70 y.o. female who was admitted 10/01/2015 for operative treatment ofOsteoarthritis of right hip. Patient has severe unremitting pain that affects sleep, daily activities, and work/hobbies. After pre-op clearance the patient was taken to the operating room on 10/01/2015 and underwent  Procedure(s): RIGHT TOTAL HIP ARTHROPLASTY ANTERIOR APPROACH.    Patient was given perioperative antibiotics: Anti-infectives    Start     Dose/Rate Route Frequency Ordered Stop   10/01/15 2300  vancomycin (VANCOCIN) IVPB 1000 mg/200 mL premix     1,000 mg 200 mL/hr over 60 Minutes Intravenous Every 12 hours 10/01/15 2041 10/02/15 0050   10/01/15 1400  vancomycin (VANCOCIN) IVPB 1000 mg/200 mL premix     1,000 mg 200 mL/hr over 60 Minutes Intravenous  Once 10/01/15 1347 10/01/15 1450   10/01/15 1348  vancomycin (VANCOCIN) 1-5 GM/200ML-% IVPB    Comments:  Forte, Lindsi   : cabinet override      10/01/15 1348 10/02/15 0159       Patient was given sequential compression devices, early ambulation, and chemoprophylaxis to prevent DVT.  Patient benefited maximally from hospital stay and there were no complications.    Recent vital signs: Patient Vitals for the past 24 hrs:  BP Temp Temp src Pulse Resp SpO2  10/04/15 0552 (!) 118/59 98.5 F (36.9 C) Oral 87 16 96 %  10/03/15 2058 112/65 99.5 F (37.5 C) Oral 85 16 100 %   10/03/15 1226 115/63 99.1 F (37.3 C) Oral 79 16 97 %     Recent laboratory studies:  Recent Labs  10/02/15 0533  WBC 6.8  HGB 11.0*  HCT 33.8*  PLT 146*  NA 136  K 3.9  CL 106  CO2 22  BUN 11  CREATININE 0.56  GLUCOSE 109*  CALCIUM 8.7*     Discharge Medications:     Medication List    TAKE these medications   aspirin 81 MG chewable tablet Chew 1 tablet (81 mg total) by mouth 2 (two) times daily.   lisinopril-hydrochlorothiazide 10-12.5 MG tablet Commonly known as:  PRINZIDE,ZESTORETIC Take 1 tablet by mouth daily.   methocarbamol 500 MG tablet Commonly known as:  ROBAXIN Take 1 tablet (500 mg total) by mouth every 6 (six) hours as needed for muscle spasms.   oxyCODONE-acetaminophen 5-325 MG tablet Commonly known as:  ROXICET Take 1-2 tablets by mouth every 4 (four) hours as needed.       Diagnostic Studies: Dg C-arm 1-60 Min  Result Date: 10/01/2015 CLINICAL DATA:  Total right arthroplasty anterior approach EXAM: DG C-ARM 61-120 MIN; OPERATIVE RIGHT HIP WITH PELVIS COMPARISON:  08/06/2015 FINDINGS: Right hip replacement in satisfactory position alignment. No fracture or complication IMPRESSION: Satisfactory right hip replacement Electronically Signed   By: Franchot Gallo M.D.   On: 10/01/2015 17:10   Dg Hip Port Unilat With Pelvis 1v Right  Result Date: 10/01/2015 CLINICAL DATA:  Status post right total hip arthroplasty.  EXAM: DG HIP (WITH OR WITHOUT PELVIS) 1V PORT RIGHT COMPARISON:  10/01/2015 FINDINGS: Hardware components of a right total hip arthroplasty device are identified. No periprosthetic fracture or subluxation identified. Moderate degenerative changes are noted involving the left hip. IMPRESSION: Status post right hip arthroplasty. No evidence for periprosthetic fracture or subluxation. Electronically Signed   By: Kerby Moors M.D.   On: 10/01/2015 21:54   Dg Hip Operative Unilat W Or W/o Pelvis Right  Result Date: 10/01/2015 CLINICAL DATA:   Total right arthroplasty anterior approach EXAM: DG C-ARM 61-120 MIN; OPERATIVE RIGHT HIP WITH PELVIS COMPARISON:  08/06/2015 FINDINGS: Right hip replacement in satisfactory position alignment. No fracture or complication IMPRESSION: Satisfactory right hip replacement Electronically Signed   By: Franchot Gallo M.D.   On: 10/01/2015 17:10    Disposition: 01-Home or Self Care  Discharge Instructions    Call MD / Call 911    Complete by:  As directed    If you experience chest pain or shortness of breath, CALL 911 and be transported to the hospital emergency room.  If you develope a fever above 101 F, pus (white drainage) or increased drainage or redness at the wound, or calf pain, call your surgeon's office.   Constipation Prevention    Complete by:  As directed    Drink plenty of fluids.  Prune juice may be helpful.  You may use a stool softener, such as Colace (over the counter) 100 mg twice a day.  Use MiraLax (over the counter) for constipation as needed.   Diet - low sodium heart healthy    Complete by:  As directed    Discharge patient    Complete by:  As directed    Increase activity slowly as tolerated    Complete by:  As directed       Follow-up [facility] .   Why:  Someone from [place] [facility] [employer] [facility]) will contact you to arrange start date and time for therapy. Contact information: [address] [place]  [address] [phone]        Mcarthur Rossetti, MD Follow up in 2 week(s).   Specialty:  Orthopedic Surgery Contact information: [place] [address] [phone]            Signed: Mcarthur Rossetti 10/04/2015, 7:01 AM

## 2015-10-04 NOTE — Progress Notes (Signed)
Discharge Note:    Patient alert and oriented X 4 and in no distress.  Patient given discharge instructions regarding signs and symptoms to report, medications, diet, activity, and upcoming appointments.  She verbalized understanding of all instructions.  Peripheral IV discontinued.  Patient confirmed that she had all of her personal belongings.  She was transported out via wheelchair by NT.   

## 2015-10-04 NOTE — Progress Notes (Signed)
Physical Therapy Treatment Patient Details Name: [name] MRN: [id number] DOB: November 01, 1945 Today's Date: 10/04/2015    History of Present Illness Pt is a 70 y/o female s/p R THA (anterior approach). PMH including but not limited to HTN.    PT Comments    Pt presented sitting OOB in recliner when PT entered room. Pt making progress with ambulation and increasing distance ambulated. However, she continues to limit herself secondary to R lateral thigh pain. PT encouraged pt to increase distance ambulated this session (to 50'). Pt would continue to benefit from skilled physical therapy services at this time while admitted and after d/c to address her limitations in order to improve her overall safety and independence with functional mobility.   Follow Up Recommendations  Home health PT;Supervision for mobility/OOB     Equipment Recommendations  Rolling walker with 5" wheels;3in1 (PT)    Recommendations for Other Services       Precautions / Restrictions Precautions Precautions: Fall Restrictions Weight Bearing Restrictions: Yes RLE Weight Bearing: Weight bearing as tolerated    Mobility  Bed Mobility               General bed mobility comments: Pt sitting OOB in recliner when PT entered room  Transfers Overall transfer level: Needs assistance Equipment used: Rolling walker (2 wheeled) Transfers: Sit to/from Stand Sit to Stand: Min guard         General transfer comment: pt required increased time, VC'ing for bilateral hand placement and min guard for safety  Ambulation/Gait Ambulation/Gait assistance: Min guard Ambulation Distance (Feet): 50 Feet Assistive device: Rolling walker (2 wheeled) Gait Pattern/deviations: Step-to pattern;Decreased step length - left;Decreased stance time - right;Decreased weight shift to right Gait velocity: decreased Gait velocity interpretation: Below normal speed for age/gender General Gait Details: pt moving very slowly and  stopping occasionally to describe her R lateral thigh pain   Stairs            Wheelchair Mobility    Modified Rankin (Stroke Patients Only)       Balance Overall balance assessment: Needs assistance Sitting-balance support: Feet supported;No upper extremity supported Sitting balance-Leahy Scale: Good     Standing balance support: During functional activity;Single extremity supported Standing balance-Leahy Scale: Poor                      Cognition Arousal/Alertness: Awake/alert Behavior During Therapy: WFL for tasks assessed/performed Overall Cognitive Status: Within Functional Limits for tasks assessed                      Exercises Total Joint Exercises Quad Sets: AROM;Strengthening;Right;10 reps;Seated Heel Slides: AROM;Strengthening;Right;10 reps;Seated Hip ABduction/ADduction: AROM;Strengthening;Right;10 reps;Seated Straight Leg Raises: AAROM;Right;10 reps;Seated Long Arc Quad: AROM;Strengthening;Right;10 reps;Seated Marching in Standing: AROM;Strengthening;Right;10 reps;Seated    General Comments        Pertinent Vitals/Pain Pain Assessment: Faces Faces Pain Scale: Hurts little more Pain Location: R lateral thigh Pain Descriptors / Indicators: Burning Pain Intervention(s): Monitored during session;Repositioned;Ice applied    Home Living                      Prior Function            PT Goals (current goals can now be found in the care plan section) Acute Rehab PT Goals Patient Stated Goal: return home PT Goal Formulation: With patient Time For Goal Achievement: 10/09/15 Potential to Achieve Goals: Good Progress towards PT goals: Progressing toward goals  Frequency    7X/week      PT Plan Current plan remains appropriate    Co-evaluation             End of Session Equipment Utilized During Treatment: Gait belt Activity Tolerance: Patient limited by pain Patient left: in chair;with call bell/phone  within reach     Time: 0912-0924 PT Time Calculation (min) (ACUTE ONLY): 12 min  Charges:  $Gait Training: 8-22 mins                    G CodesClearnce Sorrel Azlynn Mitnick 2015/10/09, 9:59 AM Sherie Don, [place], DPT [phone]

## 2015-10-04 NOTE — Progress Notes (Signed)
Physical Therapy Treatment Patient Details Name: [name] MRN: [id number] DOB: 14-Jan-1945 Today's Date: 10/04/2015    History of Present Illness Pt is a 70 y/o female s/p R THA (anterior approach). PMH including but not limited to HTN.    PT Comments    Pt presented sitting OOB in recliner when PT entered room. Pt's daughter was present throughout session as well. Pt making good progress towards achieving her functional goals and again performed stair training this session. Pt would continue to benefit from skilled physical therapy services at this time while admitted and after d/c to address her limitations in order to improve her overall safety and independence with functional mobility.   Follow Up Recommendations  Home health PT;Supervision for mobility/OOB     Equipment Recommendations  Rolling walker with 5" wheels;3in1 (PT)    Recommendations for Other Services       Precautions / Restrictions Precautions Precautions: Fall Restrictions Weight Bearing Restrictions: Yes RLE Weight Bearing: Weight bearing as tolerated    Mobility  Bed Mobility               General bed mobility comments: Pt sitting OOB in recliner when PT entered room  Transfers Overall transfer level: Needs assistance Equipment used: Rolling walker (2 wheeled) Transfers: Sit to/from Stand Sit to Stand: Supervision         General transfer comment: pt required increased time  Ambulation/Gait Ambulation/Gait assistance: Min guard Ambulation Distance (Feet): 40 Feet Assistive device: Rolling walker (2 wheeled) Gait Pattern/deviations: Step-to pattern;Decreased step length - left;Decreased stance time - right;Decreased weight shift to right Gait velocity: decreased Gait velocity interpretation: Below normal speed for age/gender General Gait Details: pt moving very slowly and stopping occasionally to describe her R lateral thigh pain   Stairs Stairs: Yes Stairs assistance: Min  guard Stair Management: Two rails;Step to pattern;Forwards Number of Stairs: 2 (x2 trials) General stair comments: pt ascended stairs with L LE leading and descended with R LE leading  Wheelchair Mobility    Modified Rankin (Stroke Patients Only)       Balance Overall balance assessment: Needs assistance Sitting-balance support: Feet supported;No upper extremity supported Sitting balance-Leahy Scale: Good     Standing balance support: During functional activity;Bilateral upper extremity supported Standing balance-Leahy Scale: Poor                      Cognition Arousal/Alertness: Awake/alert Behavior During Therapy: WFL for tasks assessed/performed Overall Cognitive Status: Within Functional Limits for tasks assessed                      Exercises      General Comments        Pertinent Vitals/Pain Pain Assessment: Faces Faces Pain Scale: Hurts a little bit Pain Location: R lateral thigh Pain Descriptors / Indicators: Burning Pain Intervention(s): Monitored during session;Repositioned    Home Living                      Prior Function            PT Goals (current goals can now be found in the care plan section) Acute Rehab PT Goals Patient Stated Goal: return home PT Goal Formulation: With patient Time For Goal Achievement: 10/09/15 Potential to Achieve Goals: Good Progress towards PT goals: Progressing toward goals    Frequency    7X/week      PT Plan Current plan remains appropriate  Co-evaluation             End of Session Equipment Utilized During Treatment: Gait belt Activity Tolerance: Patient limited by pain Patient left: in chair;with call bell/phone within reach;with family/visitor present     Time: [id number] PT Time Calculation (min) (ACUTE ONLY): 20 min  Charges:  $Gait Training: 8-22 mins                    G Codes:      Clearnce Sorrel Akina Maish 2015-10-20, 1:20 PM Sherie Don, [place],  DPT [phone]

## 2015-10-04 NOTE — Progress Notes (Signed)
Patient ID: [name], female   DOB: 1945-11-11, 70 y.o.   MRN: [id number] Doing well.  Can be discharged to home today.

## 2015-10-04 NOTE — Care Management Important Message (Signed)
Important Message  Patient Details  Name: [name] MRN: [id number] Date of Birth: 12-14-1945   Medicare Important Message Given:  Yes    Orbie Pyo 10/04/2015, 11:46 AM

## 2015-10-05 DIAGNOSIS — Z6835 Body mass index (BMI) 35.0-35.9, adult: Secondary | ICD-10-CM | POA: Diagnosis not present

## 2015-10-05 DIAGNOSIS — Z471 Aftercare following joint replacement surgery: Secondary | ICD-10-CM | POA: Diagnosis not present

## 2015-10-05 DIAGNOSIS — Z96641 Presence of right artificial hip joint: Secondary | ICD-10-CM | POA: Diagnosis not present

## 2015-10-05 DIAGNOSIS — Z87891 Personal history of nicotine dependence: Secondary | ICD-10-CM | POA: Diagnosis not present

## 2015-10-07 ENCOUNTER — Other Ambulatory Visit (INDEPENDENT_AMBULATORY_CARE_PROVIDER_SITE_OTHER): Payer: Self-pay | Admitting: Physician Assistant

## 2015-10-07 ENCOUNTER — Inpatient Hospital Stay (INDEPENDENT_AMBULATORY_CARE_PROVIDER_SITE_OTHER): Payer: Medicare Other | Admitting: Physician Assistant

## 2015-10-07 DIAGNOSIS — M7989 Other specified soft tissue disorders: Principal | ICD-10-CM

## 2015-10-07 DIAGNOSIS — M1611 Unilateral primary osteoarthritis, right hip: Secondary | ICD-10-CM | POA: Diagnosis not present

## 2015-10-07 DIAGNOSIS — M25551 Pain in right hip: Secondary | ICD-10-CM

## 2015-10-07 DIAGNOSIS — M79604 Pain in right leg: Secondary | ICD-10-CM

## 2015-10-07 DIAGNOSIS — M79605 Pain in left leg: Secondary | ICD-10-CM

## 2015-10-08 ENCOUNTER — Ambulatory Visit (HOSPITAL_COMMUNITY): Payer: Medicare Other

## 2015-10-08 ENCOUNTER — Ambulatory Visit
Admission: RE | Admit: 2015-10-08 | Discharge: 2015-10-08 | Disposition: A | Discharge status: Home / Self Care | Payer: Medicare Other | Source: Ambulatory Visit | Attending: Physician Assistant | Admitting: Physician Assistant

## 2015-10-08 DIAGNOSIS — M7989 Other specified soft tissue disorders: Secondary | ICD-10-CM | POA: Diagnosis not present

## 2015-10-08 DIAGNOSIS — M79604 Pain in right leg: Secondary | ICD-10-CM

## 2015-10-09 DIAGNOSIS — Z6835 Body mass index (BMI) 35.0-35.9, adult: Secondary | ICD-10-CM | POA: Diagnosis not present

## 2015-10-09 DIAGNOSIS — Z471 Aftercare following joint replacement surgery: Secondary | ICD-10-CM | POA: Diagnosis not present

## 2015-10-09 DIAGNOSIS — Z87891 Personal history of nicotine dependence: Secondary | ICD-10-CM | POA: Diagnosis not present

## 2015-10-09 DIAGNOSIS — Z96641 Presence of right artificial hip joint: Secondary | ICD-10-CM | POA: Diagnosis not present

## 2015-10-10 DIAGNOSIS — Z471 Aftercare following joint replacement surgery: Secondary | ICD-10-CM | POA: Diagnosis not present

## 2015-10-10 DIAGNOSIS — Z87891 Personal history of nicotine dependence: Secondary | ICD-10-CM | POA: Diagnosis not present

## 2015-10-10 DIAGNOSIS — Z6835 Body mass index (BMI) 35.0-35.9, adult: Secondary | ICD-10-CM | POA: Diagnosis not present

## 2015-10-10 DIAGNOSIS — Z96641 Presence of right artificial hip joint: Secondary | ICD-10-CM | POA: Diagnosis not present

## 2015-10-11 DIAGNOSIS — Z96641 Presence of right artificial hip joint: Secondary | ICD-10-CM | POA: Diagnosis not present

## 2015-10-11 DIAGNOSIS — Z87891 Personal history of nicotine dependence: Secondary | ICD-10-CM | POA: Diagnosis not present

## 2015-10-11 DIAGNOSIS — Z6835 Body mass index (BMI) 35.0-35.9, adult: Secondary | ICD-10-CM | POA: Diagnosis not present

## 2015-10-11 DIAGNOSIS — Z471 Aftercare following joint replacement surgery: Secondary | ICD-10-CM | POA: Diagnosis not present

## 2015-10-14 ENCOUNTER — Inpatient Hospital Stay (INDEPENDENT_AMBULATORY_CARE_PROVIDER_SITE_OTHER): Payer: Self-pay | Admitting: Orthopaedic Surgery

## 2015-10-15 DIAGNOSIS — Z6835 Body mass index (BMI) 35.0-35.9, adult: Secondary | ICD-10-CM | POA: Diagnosis not present

## 2015-10-15 DIAGNOSIS — Z96641 Presence of right artificial hip joint: Secondary | ICD-10-CM | POA: Diagnosis not present

## 2015-10-15 DIAGNOSIS — Z471 Aftercare following joint replacement surgery: Secondary | ICD-10-CM | POA: Diagnosis not present

## 2015-10-15 DIAGNOSIS — Z87891 Personal history of nicotine dependence: Secondary | ICD-10-CM | POA: Diagnosis not present

## 2015-10-16 ENCOUNTER — Inpatient Hospital Stay (INDEPENDENT_AMBULATORY_CARE_PROVIDER_SITE_OTHER): Payer: Medicare Other | Admitting: Orthopaedic Surgery

## 2015-10-16 DIAGNOSIS — Z87891 Personal history of nicotine dependence: Secondary | ICD-10-CM | POA: Diagnosis not present

## 2015-10-16 DIAGNOSIS — Z96641 Presence of right artificial hip joint: Secondary | ICD-10-CM | POA: Diagnosis not present

## 2015-10-16 DIAGNOSIS — M1611 Unilateral primary osteoarthritis, right hip: Secondary | ICD-10-CM

## 2015-10-16 DIAGNOSIS — Z6835 Body mass index (BMI) 35.0-35.9, adult: Secondary | ICD-10-CM | POA: Diagnosis not present

## 2015-10-16 DIAGNOSIS — Z471 Aftercare following joint replacement surgery: Secondary | ICD-10-CM | POA: Diagnosis not present

## 2015-10-16 DIAGNOSIS — M25551 Pain in right hip: Secondary | ICD-10-CM

## 2015-10-18 DIAGNOSIS — Z6835 Body mass index (BMI) 35.0-35.9, adult: Secondary | ICD-10-CM | POA: Diagnosis not present

## 2015-10-18 DIAGNOSIS — Z96641 Presence of right artificial hip joint: Secondary | ICD-10-CM | POA: Diagnosis not present

## 2015-10-18 DIAGNOSIS — Z87891 Personal history of nicotine dependence: Secondary | ICD-10-CM | POA: Diagnosis not present

## 2015-10-18 DIAGNOSIS — Z471 Aftercare following joint replacement surgery: Secondary | ICD-10-CM | POA: Diagnosis not present

## 2015-10-31 ENCOUNTER — Ambulatory Visit (INDEPENDENT_AMBULATORY_CARE_PROVIDER_SITE_OTHER): Payer: Medicare Other | Admitting: Sports Medicine

## 2015-10-31 ENCOUNTER — Encounter (INDEPENDENT_AMBULATORY_CARE_PROVIDER_SITE_OTHER): Payer: Self-pay | Admitting: Sports Medicine

## 2015-10-31 DIAGNOSIS — M170 Bilateral primary osteoarthritis of knee: Secondary | ICD-10-CM | POA: Diagnosis not present

## 2015-10-31 NOTE — Progress Notes (Signed)
[name] - 70 y.o. female MRN [id number]  Date of birth: 12-01-1945  Office Visit Note: Visit Date: 10/31/2015 PCP: Anthoney Harada, MD Referred by: Vernie Shanks, MD  Subjective: Chief Complaint  Patient presents with  . Right Knee - Follow-up  . Left Knee - Follow-up   HPI: Ambulates with a cane. Patient states both knees are stiff. No swelling.  States she had a Right  Hip replacement for 4weeks ago with Dr. Ninfa Linden.  Here for Bilateral Injections.     ROS Otherwise per HPI.  Assessment & Plan: Visit Diagnoses:  1. Primary osteoarthritis of both knees     Plan:   Visco supplementation performed today. Patient has bilateral knee osteoarthritis with associated pain. She is contemplating total knee arthroplasty with likely defer this as long as possible.               Meds & Orders: No orders of the defined types were placed in this encounter.   Orders Placed This Encounter  Procedures  . Large Joint Injection/Arthrocentesis  . Large Joint Injection/Arthrocentesis    Follow-up: Return if symptoms worsen or fail to improve.   Procedures: US Guided Right Aspiration & Viscosupplementation Date/Time: 10/31/2015 4:33 PM Performed by: Teresa Coombs D Authorized by: Teresa Coombs D   Indications:  Pain and joint swelling Location:  Knee Site:  R knee Needle Size:  18 G Approach:  Superolateral Ultrasound Guidance: Yes   Fluoroscopic Guidance: No   Arthrogram: No Medications:  88 mg Hyaluronan 88 MG/4ML Aspiration Attempted: Yes   Aspirate amount (mL):  22 Aspirate:  Blood-tinged Comments: After informed consent was obtained & all questions were answered the target sight was prepped in sterile fashion using alcohol, ethel chloride and sterile ultrasound technique. Subsequently using a 25g needle, 72mL of 1% lidocaine was used for local anesthetic via the above approach. Under real-time ultrasound guidance,  using an 18g needle, aspirate was obtained as  above. Subsequently, using sterile stopcock technique, injection of the above medications was performed without difficulty. Band-Aid and 6 inch Ace wrap was applied. Patient tolerated this procedure well with no immediate complications.   US Guided Knee Aspiration and Viscosupplemention Date/Time: 10/31/2015 4:36 PM Performed by: Teresa Coombs D Authorized by: Teresa Coombs D   Indications:  Pain and joint swelling Location:  Knee Site:  L knee Needle Size:  18 G Approach:  Superolateral Ultrasound Guidance: Yes   Fluoroscopic Guidance: No   Arthrogram: No Medications:  88 mg Hyaluronan 88 MG/4ML Aspiration Attempted: Yes   Aspirate amount (mL):  35 Aspirate:  Yellow Comments: After informed consent was obtained & all questions were answered the target sight was prepped in sterile fashion using alcohol, ethel chloride and sterile ultrasound technique. Subsequently using a 25g needle, 44mL of 1% lidocaine was used for local anesthetic via the above approach. Under real-time ultrasound guidance,  using an 18g needle, aspirate was obtained as above. Subsequently, using sterile stopcock technique, injection of the above medications was performed without difficulty. Band-Aid and 6 inch Ace wrap was applied. Patient tolerated this procedure well with no immediate complications.     No notes on file   Clinical History: No additional findings.  She reports that she quit smoking about 37 years ago. Her smoking use included Cigarettes. She has never used smokeless tobacco. No results for input(s): HGBA1C, LABURIC in the last 8760 hours.  Objective:  VS:  HT:5' 1.5" (156.2 cm)   WT:   BMI:  BP:113/71  HR:73bpm  TEMP: ( )  RESP:  Physical Exam  Constitutional: No distress.  HENT:  Head: Normocephalic and atraumatic.  Eyes: Right eye exhibits no discharge. Left eye exhibits no discharge. No scleral icterus.  Pulmonary/Chest: Effort normal. No respiratory distress.  Neurological: She  is alert.  Appropriately interactive.  Skin: Skin is warm and dry. No rash noted. She is not diaphoretic. No erythema. No pallor.  Psychiatric: Judgment normal.    Right Knee Exam   Comments:  Bilateral knees are overall in slight genu valgus. She has a small effusion on the right & moderate effusion on the left. 2-3 mm of opening with valgus testing. Pain with McMurray's. Positive patellar grind.     Imaging: No results found.  Past Medical/Family/Surgical/Social History: Patient Active Problem List   Diagnosis Date Noted  . Osteoarthritis of both knees 10/31/2015  . Osteoarthritis of right hip 10/01/2015  . Status post total replacement of right hip 10/01/2015  . Right hip pain 08/24/2015  . Cellulitis 06/26/2010   Past Medical History:  Diagnosis Date  . Arthritis   . Asthma   . Hypertension   . Seasonal allergies    Family History  Problem Relation Age of Onset  . Cancer Mother    Past Surgical History:  Procedure Laterality Date  . ELBOW ARTHROSCOPY WITH TENDON RECONSTRUCTION    . FOOT SURGERY    . TOTAL HIP ARTHROPLASTY Right 10/01/2015   Procedure: RIGHT TOTAL HIP ARTHROPLASTY ANTERIOR APPROACH;  Surgeon: Mcarthur Rossetti, MD;  Location: [facility];  Service: Orthopedics;  Laterality: Right;   Social History   Occupational History  . Not on file.   Social History Main Topics  . Smoking status: Former Smoker    Types: Cigarettes    Quit date: 01/05/1978  . Smokeless tobacco: Never Used  . Alcohol use No  . Drug use: No  . Sexual activity: Not on file

## 2015-11-04 MED ORDER — HYALURONAN 88 MG/4ML IX SOSY
88.0000 mg | PREFILLED_SYRINGE | INTRA_ARTICULAR | Status: AC | PRN
  Administered 2015-10-31: 88 mg via INTRA_ARTICULAR

## 2015-11-13 ENCOUNTER — Ambulatory Visit (INDEPENDENT_AMBULATORY_CARE_PROVIDER_SITE_OTHER): Payer: Medicare Other | Admitting: Orthopaedic Surgery

## 2015-11-13 ENCOUNTER — Other Ambulatory Visit (INDEPENDENT_AMBULATORY_CARE_PROVIDER_SITE_OTHER): Payer: Self-pay | Admitting: Orthopaedic Surgery

## 2015-11-13 DIAGNOSIS — Z96641 Presence of right artificial hip joint: Secondary | ICD-10-CM

## 2015-11-13 NOTE — Progress Notes (Signed)
[name] is doing well at 6 weeks status post a right total hip arthroplasty. I think she is here last week and saw Dr. Paulla Fore for bilateral knee hyaluronic acid injections. She says those are doing well. Examination of her right hip shows that moves fluidly with just a little bit of stiffness. She still has subjective numbness around her incision but otherwise seems to be doing well.  At this point she can stop using her cane as comfort allows. We'll see her back in a month and see how she is doing overall. If she looks good at that visit we will not need to see her back for 6 months.

## 2015-11-13 NOTE — Telephone Encounter (Signed)
Please advise 

## 2015-12-11 ENCOUNTER — Ambulatory Visit (INDEPENDENT_AMBULATORY_CARE_PROVIDER_SITE_OTHER): Payer: Medicare Other | Admitting: Orthopaedic Surgery

## 2015-12-11 DIAGNOSIS — Z96641 Presence of right artificial hip joint: Secondary | ICD-10-CM

## 2015-12-11 NOTE — Progress Notes (Signed)
The patient is now 9 weeks status post a right total hip arthroplasty through direct anterior approach. She is doing well other than still having difficulty in her socks on and crossing her legs. Otherwise she is happier than she was before her surgery.  On exam she has excellent internal/external rotation of her right hip with no difficulties at all.  At this point she'll continue to increase her activities. She is more kumqu[place] training exercises. I would like her to return in 6 months and that visit I would like a low AP pelvis and a lateral of her right operative hip.

## 2015-12-20 DIAGNOSIS — J019 Acute sinusitis, unspecified: Secondary | ICD-10-CM | POA: Diagnosis not present

## 2016-01-03 DIAGNOSIS — M2042 Other hammer toe(s) (acquired), left foot: Secondary | ICD-10-CM | POA: Diagnosis not present

## 2016-01-03 DIAGNOSIS — L600 Ingrowing nail: Secondary | ICD-10-CM | POA: Diagnosis not present

## 2016-01-03 DIAGNOSIS — D2372 Other benign neoplasm of skin of left lower limb, including hip: Secondary | ICD-10-CM | POA: Diagnosis not present

## 2016-01-09 DIAGNOSIS — Z23 Encounter for immunization: Secondary | ICD-10-CM | POA: Diagnosis not present

## 2016-02-11 DIAGNOSIS — Z1231 Encounter for screening mammogram for malignant neoplasm of breast: Secondary | ICD-10-CM | POA: Diagnosis not present

## 2016-02-11 DIAGNOSIS — I10 Essential (primary) hypertension: Secondary | ICD-10-CM | POA: Diagnosis not present

## 2016-02-11 DIAGNOSIS — M1990 Unspecified osteoarthritis, unspecified site: Secondary | ICD-10-CM | POA: Diagnosis not present

## 2016-02-11 DIAGNOSIS — E669 Obesity, unspecified: Secondary | ICD-10-CM | POA: Diagnosis not present

## 2016-02-11 DIAGNOSIS — Z6834 Body mass index (BMI) 34.0-34.9, adult: Secondary | ICD-10-CM | POA: Diagnosis not present

## 2016-02-11 DIAGNOSIS — J45909 Unspecified asthma, uncomplicated: Secondary | ICD-10-CM | POA: Diagnosis not present

## 2016-02-11 DIAGNOSIS — Z1211 Encounter for screening for malignant neoplasm of colon: Secondary | ICD-10-CM | POA: Diagnosis not present

## 2016-02-11 DIAGNOSIS — E785 Hyperlipidemia, unspecified: Secondary | ICD-10-CM | POA: Diagnosis not present

## 2016-02-19 ENCOUNTER — Other Ambulatory Visit: Payer: Self-pay | Admitting: Family Medicine

## 2016-02-19 DIAGNOSIS — Z1231 Encounter for screening mammogram for malignant neoplasm of breast: Secondary | ICD-10-CM

## 2016-02-27 DIAGNOSIS — B079 Viral wart, unspecified: Secondary | ICD-10-CM | POA: Diagnosis not present

## 2016-02-27 DIAGNOSIS — M79672 Pain in left foot: Secondary | ICD-10-CM | POA: Diagnosis not present

## 2016-03-12 DIAGNOSIS — D2372 Other benign neoplasm of skin of left lower limb, including hip: Secondary | ICD-10-CM | POA: Diagnosis not present

## 2016-03-12 DIAGNOSIS — M79672 Pain in left foot: Secondary | ICD-10-CM | POA: Diagnosis not present

## 2016-03-12 DIAGNOSIS — B351 Tinea unguium: Secondary | ICD-10-CM | POA: Diagnosis not present

## 2016-03-18 ENCOUNTER — Ambulatory Visit: Payer: Medicare Other | Admitting: Sports Medicine

## 2016-03-20 ENCOUNTER — Ambulatory Visit: Payer: Medicare Other | Admitting: Sports Medicine

## 2016-03-24 ENCOUNTER — Ambulatory Visit
Admission: RE | Admit: 2016-03-24 | Discharge: 2016-03-24 | Disposition: A | Discharge status: Home / Self Care | Payer: Medicare Other | Source: Ambulatory Visit | Attending: Family Medicine | Admitting: Family Medicine

## 2016-03-24 DIAGNOSIS — Z1231 Encounter for screening mammogram for malignant neoplasm of breast: Secondary | ICD-10-CM

## 2016-04-10 DIAGNOSIS — M79672 Pain in left foot: Secondary | ICD-10-CM | POA: Diagnosis not present

## 2016-04-10 DIAGNOSIS — M79671 Pain in right foot: Secondary | ICD-10-CM | POA: Diagnosis not present

## 2016-04-10 DIAGNOSIS — B351 Tinea unguium: Secondary | ICD-10-CM | POA: Diagnosis not present

## 2016-04-16 DIAGNOSIS — Z1211 Encounter for screening for malignant neoplasm of colon: Secondary | ICD-10-CM | POA: Diagnosis not present

## 2016-05-04 ENCOUNTER — Ambulatory Visit: Payer: Self-pay

## 2016-05-04 ENCOUNTER — Ambulatory Visit (INDEPENDENT_AMBULATORY_CARE_PROVIDER_SITE_OTHER): Payer: Medicare Other | Admitting: Sports Medicine

## 2016-05-04 ENCOUNTER — Encounter: Payer: Self-pay | Admitting: Sports Medicine

## 2016-05-04 VITALS — BP 140/88 | HR 62 | Ht 61.5 in | Wt 208.0 lb

## 2016-05-04 DIAGNOSIS — M170 Bilateral primary osteoarthritis of knee: Secondary | ICD-10-CM | POA: Diagnosis not present

## 2016-05-04 NOTE — Progress Notes (Addendum)
OFFICE VISIT NOTE [name]. [name], [facility]  [name] - 71 y.o. female MRN [id number]  Date of birth: April 23, 1945  Visit Date: 05/04/2016  PCP: Vernie Shanks, MD   Referred by: Vernie Shanks, MD  Jari Sportsman, CMA acting as scribe for Dr. Paulla Fore.  SUBJECTIVE:   Chief Complaint  Patient presents with  . Bileral Knee OA   [name] is here today for her 2nd [place]. She tolerated 1st series well with much improvement.    Review of Systems  Constitutional: Negative.   HENT: Negative.   Eyes: Negative.   Respiratory: Negative.   Cardiovascular: Negative.   Gastrointestinal: Negative.   Genitourinary: Negative.   Musculoskeletal: Negative.   Skin: Negative.   Neurological: Negative.   Endo/Heme/Allergies: Negative.   Psychiatric/Behavioral: Negative.     Otherwise per HPI.  HISTORY & PERTINENT PRIOR DATA:  No specialty comments available. She reports that she quit smoking about 38 years ago. Her smoking use included Cigarettes. She has never used smokeless tobacco. No results for input(s): HGBA1C, LABURIC in the last 8760 hours. Medications & Allergies reviewed per EMR Patient Active Problem List   Diagnosis Date Noted  . Osteoarthritis of both knees 10/31/2015  . Osteoarthritis of right hip 10/01/2015  . Status post total replacement of right hip 10/01/2015  . Right hip pain 08/24/2015  . Cellulitis 06/26/2010   Past Medical History:  Diagnosis Date  . Arthritis   . Asthma   . Hypertension   . Seasonal allergies    Family History  Problem Relation Age of Onset  . Cancer Mother   . Breast cancer Neg Hx    Past Surgical History:  Procedure Laterality Date  . ELBOW ARTHROSCOPY WITH TENDON RECONSTRUCTION    . FOOT SURGERY    . TOTAL HIP ARTHROPLASTY Right 10/01/2015   Procedure: RIGHT TOTAL HIP ARTHROPLASTY ANTERIOR APPROACH;  Surgeon: Mcarthur Rossetti, MD;   Location: [facility];  Service: Orthopedics;  Laterality: Right;   Social History   Occupational History  . Not on file.   Social History Main Topics  . Smoking status: Former Smoker    Types: Cigarettes    Quit date: 01/05/1978  . Smokeless tobacco: Never Used  . Alcohol use No  . Drug use: No  . Sexual activity: Not on file    OBJECTIVE:  VS:  HT:5' 1.5" (156.2 cm)   WT:208 lb (94.3 kg)  BMI:38.7    BP:140/88  HR:62bpm  TEMP: ( )  RESP:99 % Physical Exam  Findings:  WDWN, NAD, Non-toxic appearing Alert & appropriately interactive Not depressed or anxious appearing No increased work of breathing. Pupils are equal. EOM intact without nystagmus No clubbing or cyanosis of the extremities appreciated No significant rashes/lesions/ulcerations overlying the examined area. DP & PT pulses 2+/4.  No significant pretibial edema.  No clubbing or cyanosis Sensation intact to light touch in lower extremities.    Bilateral Knee: Valgus deformity bilaterally.  Generalized osteoarthritic bossing Large effusions bilaterally.   ROM: 3 to 95.   Extensor mechanism intact 3-4 mm of opening with varus and valgus testing bilaterally.   Pain with McMurray's bilaterally.        US GUIDED Bilateral Knee Aspirations & Injections Date/Time: 05/04/2016 1:58 PM Performed by: Teresa Coombs D Authorized by: Teresa Coombs D  Comments: Images were obtained and interpreted by myself, Teresa Coombs, DO  Images have been saved and  stored to PACS system. Images obtained on: GE S7 Ultrasound machine  ULTRASOUND FINDINGS:  RIGHT KNEE: Large effusion with synovitis LEFT KNEE: Large effusion with synovitis  DESCRIPTION OF PROCEDURE:  The patient's clinical condition is marked by substantial pain and/or significant functional disability. Other conservative therapy has not provided relief, is contraindicated, or not appropriate. There is a reasonable likelihood that injection will significantly  improve the patient's pain and/or functional impairment. After discussing the risks, benefits and expected outcomes of the injection and all questions were reviewed and answered, the patient wished to undergo the above named procedure. Verbal consent was obtained. The ultrasound was used to identify the target structure and adjacent neurovascular structures. The skin was then prepped in sterile fashion and the target structure was injected under direct visualization using sterile technique as below:  RIGHT KNEE: PREP: Alcohol, Ethel Chloride, 5cc 1% lidocaine on 25 needle APPROACH: Superiolateral, stopcock technique, 18g 1.5" needle INJECTATE: Monovisc(88mg  hyaluronic acid) ASPIRATE: 60 serous fluid DRESSING: Band-Aid and 6 " Ace-Wrap  LEFT KNEE: PREP: Alcohol, Ethel Chloride, 5cc 1% lidocaine on 25 needle APPROACH: Superiolateral, stopcock technique, 18g 1.5" needle INJECTATE: Monovisc(88mg  hyaluronic acid) ASPIRATE: 65 mL serous fluid  DRESSING: Band-Aid and 6 " Ace-Wrap   Post procedural instructions including recommending icing and warning signs for infection were reviewed. This procedure was well tolerated and there were no complications.   IMPRESSION: Succesful US Guided Aspiration & injection      ASSESSMENT & PLAN:   Problem List Items Addressed This Visit    Osteoarthritis of both knees - Primary    Bilateral Monovisc injections performed today. Voltaren gel recommended. Body Helix compression sleeve made. Continue strengthening of the VMO and hip abductor's.      Relevant Medications   diclofenac sodium (VOLTAREN) 1 % GEL   Other Relevant Orders   US GUIDED NEEDLE PLACEMENT(NO LINKED CHARGES) (Completed)      Follow-up: Return in about 6 months (around 11/03/2016).   CMA/ATC served as Education administrator during this visit. History, Physical, and Plan performed by medical provider. Documentation and orders reviewed and attested to.      Teresa Coombs, [facility]  Sports Medicine Physician

## 2016-05-04 NOTE — Patient Instructions (Signed)
I recommend you obtained a compression sleeve to help with your joint problems. There are many options on the market however I recommend obtaining a Full Knee Body Helix compression sleeve.  You can find information (including how to appropriate measure yourself for sizing) can be found at www.Body Helix.com.  Many of these products are health savings account (HSA) eligible.   You can use the compression sleeve at any time throughout the day but is most important to use while being active as well as for 2 hours post-activity.   It is appropriate to ice following activity with the compression sleeve in place.   

## 2016-05-05 MED ORDER — DICLOFENAC SODIUM 1 % TD GEL: TRANSDERMAL | 3 refills | Status: AC

## 2016-05-05 MED ORDER — DICLOFENAC SODIUM 1 % TD GEL: TRANSDERMAL | 3 refills | Status: DC

## 2016-05-06 ENCOUNTER — Telehealth: Payer: Self-pay | Admitting: Sports Medicine

## 2016-05-06 NOTE — Telephone Encounter (Signed)
Pt was prescribed Voltaren gel 1% and needs a PA. Can you do this for me?

## 2016-05-06 NOTE — Telephone Encounter (Signed)
PA was sent for approval.

## 2016-05-06 NOTE — Telephone Encounter (Signed)
Pt called and stated the RX Dr. Paulla Fore sent in for her yesterday can not be fill until pre-approved by her insurance, she said Dr. Paulla Fore would have to call [phone] and answer some questions before the pharmacy can fill it, please advise

## 2016-05-12 NOTE — Telephone Encounter (Signed)
I just checked on cover my meds. It looks like the PA was denied.

## 2016-05-12 NOTE — Telephone Encounter (Signed)
FYI: Pt is aware that PA is denied. She has paid for this medication out of pocket and it is work well for her.

## 2016-05-12 NOTE — Telephone Encounter (Signed)
Is there a way to check to see if this is still pending or should I call the pharmacy?

## 2016-06-01 NOTE — Assessment & Plan Note (Signed)
Bilateral Monovisc injections performed today. Voltaren gel recommended. Body Helix compression sleeve made. Continue strengthening of the VMO and hip abductor's.

## 2016-09-11 ENCOUNTER — Telehealth: Payer: Self-pay | Admitting: Sports Medicine

## 2016-09-11 NOTE — Telephone Encounter (Signed)
Patient called in reference to wanting to know if Dr. Paulla Fore can refer her to a dermatologist. I informed patient she might have to see her PCP for that. Patient also wanted to speak with someone about her upcoming appointment with Paulla Fore in October. Please call patient and advise. OK to leave message

## 2016-09-14 NOTE — Telephone Encounter (Signed)
Spoke with patient and she advised that she hasn't really been having any trouble with her knee since her last visit. She wasn't sure if she still needed to come back in. She will call back a little closer to her appt date since its still about 1.5 mos out.

## 2016-11-02 DIAGNOSIS — Z6837 Body mass index (BMI) 37.0-37.9, adult: Secondary | ICD-10-CM | POA: Diagnosis not present

## 2016-11-02 DIAGNOSIS — I10 Essential (primary) hypertension: Secondary | ICD-10-CM | POA: Diagnosis not present

## 2016-11-02 DIAGNOSIS — Z1231 Encounter for screening mammogram for malignant neoplasm of breast: Secondary | ICD-10-CM | POA: Diagnosis not present

## 2016-11-02 DIAGNOSIS — Z1211 Encounter for screening for malignant neoplasm of colon: Secondary | ICD-10-CM | POA: Diagnosis not present

## 2016-11-02 DIAGNOSIS — E669 Obesity, unspecified: Secondary | ICD-10-CM | POA: Diagnosis not present

## 2016-11-02 DIAGNOSIS — R21 Rash and other nonspecific skin eruption: Secondary | ICD-10-CM | POA: Diagnosis not present

## 2016-11-02 DIAGNOSIS — M1990 Unspecified osteoarthritis, unspecified site: Secondary | ICD-10-CM | POA: Diagnosis not present

## 2016-11-02 DIAGNOSIS — J45909 Unspecified asthma, uncomplicated: Secondary | ICD-10-CM | POA: Diagnosis not present

## 2016-11-02 DIAGNOSIS — Z23 Encounter for immunization: Secondary | ICD-10-CM | POA: Diagnosis not present

## 2016-11-02 DIAGNOSIS — E785 Hyperlipidemia, unspecified: Secondary | ICD-10-CM | POA: Diagnosis not present

## 2016-11-04 ENCOUNTER — Ambulatory Visit (INDEPENDENT_AMBULATORY_CARE_PROVIDER_SITE_OTHER): Payer: Medicare Other | Admitting: Sports Medicine

## 2016-11-04 ENCOUNTER — Encounter: Payer: Self-pay | Admitting: Sports Medicine

## 2016-11-04 ENCOUNTER — Ambulatory Visit: Payer: Self-pay

## 2016-11-04 VITALS — BP 130/82 | HR 62 | Ht 61.5 in | Wt 208.0 lb

## 2016-11-04 DIAGNOSIS — M170 Bilateral primary osteoarthritis of knee: Secondary | ICD-10-CM | POA: Diagnosis not present

## 2016-11-04 NOTE — Progress Notes (Signed)
OFFICE VISIT NOTE [name]. Lashanta Elbe, [facility]  HENNESSEY CANTRELL - 71 y.o. female MRN [id number]  Date of birth: 14-Nov-1945  Visit Date: 11/04/2016  PCP: Vernie Shanks, MD   Referred by: Vernie Shanks, MD  Thalia Bloodgood PT, LAT, ATC acting as scribe for Dr. Paulla Fore.  SUBJECTIVE:   Chief Complaint  Patient presents with  . Follow-up    B knee OA   HPI: As below and per problem based documentation when appropriate.  Ms. Weilbacher is an established presenting today for a 6 month f/u of her B knee pain / OA.  Pt's last visit w/ Dr. Paulla Fore was on 05/04/2016.  Pt states that her knees have been doing very well.  She notes that they are stiff.  She states that she wears the Body Helix sleeves when she needs them.  She reports having some pain but notes that the pain is manageable.  She feels like her knees are definitely better today compared to how they've felt at prior visits.  She also feels like her knees are also better after having R hip replaced in Sept. 2017.    Review of Systems  Constitutional: Negative for chills, fever and weight loss.  HENT: Negative.   Eyes: Negative.   Respiratory: Negative for cough, shortness of breath and wheezing.   Cardiovascular: Negative for chest pain and palpitations.  Gastrointestinal: Negative for abdominal pain, heartburn and nausea.  Musculoskeletal: Positive for joint pain. Negative for falls.  Neurological: Negative for dizziness, tingling and headaches.  Endo/Heme/Allergies: Bruises/bleeds easily.  Psychiatric/Behavioral: Negative for depression. The patient is not nervous/anxious and does not have insomnia.     Otherwise per HPI.   HISTORY & PERTINENT PRIOR DATA:  Prior History reviewed and updated per electronic medical record.  Significant history, findings, studies and interim changes include:  reports that she quit smoking about 38 years ago. Her smoking use  included cigarettes. she has never used smokeless tobacco. No results for input(s): HGBA1C, LABURIC, CREATINE in the last 8760 hours. No specialty comments available. Problem  Osteoarthritis of Both Knees     OBJECTIVE:  VS:  HT:5' 1.5" (156.2 cm)   WT:208 lb (94.3 kg)  BMI:38.67    BP:130/82  HR:62bpm  TEMP: ( )  RESP:97 %  PHYSICAL EXAM: Constitutional: WDWN, Non-toxic appearing. Psychiatric: Alert & appropriately interactive. Not depressed or anxious appearing. Respiratory: No increased work of breathing. Trachea Midline Eyes: Pupils are equal. EOM intact without nystagmus. No scleral icterus Cardiovascular:  Peripheral Pulses: peripheral pulses symmetrical No clubbing or cyanosis appreciated Capillary Refill is normal, less than 2 seconds No signficant generalized edema/anasarca Sensory Exam: intact to light touch  Bilateral knees: Generalized genu valgus that is slight.  She has a moderate effusion bilaterally.  Ligamentously she is stable with 2-3 mm of opening with valgus testing bilaterally.  No additional findings.   ASSESSMENT & PLAN:   1. Primary osteoarthritis of both knees    PLAN: Orthovisc No. 1 of 3 Osteoarthritis of both knees Orthovisc 1 performed today.  ++++++++++++++++++++++++++++++++++++++++++++ PROCEDURE NOTE - ULTRASOUND GUIDED ASPIRATION & INJECTION: BILATERAL KNEES - Orthovisc #1/3 Images were obtained and interpreted by myself, Teresa Coombs, DO  Images have been saved and stored to PACS system. Images obtained on: GE S7 Ultrasound machine  ULTRASOUND FINDINGS: Moderate Effusions  DESCRIPTION OF PROCEDURE:  The patient's clinical condition is marked by substantial pain and/or significant functional disability. Other  conservative therapy has not provided relief, is contraindicated, or not appropriate. There is a reasonable likelihood that injection will significantly improve the patient's pain and/or functional impairment. After  discussing the risks, benefits and expected outcomes of the injection and all questions were reviewed and answered, the patient wished to undergo the above named procedure. Verbal consent was obtained. The ultrasound was used to identify the target structure and adjacent neurovascular structures. The skin was then prepped in sterile fashion and the target structure was injected under direct visualization using sterile technique as below:  RIGHT: PREP: Alcohol, Ethel Chloride, 3cc 1% lidocaine on 25 needle APPROACH: Superiolateral, stopcock technique, 18g 1.5" needle INJECTATE: Orthovisc ASPIRATE: 44 cc of serous fluid DRESSING: Band-Aid Body Helix compression sleeve  LEFT: PREP: Alcohol, Ethel Chloride, 3cc 1% lidocaine on 25 needle APPROACH: Superiolateral, stopcock technique, 18g 1.5" needle INJECTATE: Orthovisc ASPIRATE: 35 cc of serous fluid DRESSING: Band-Aid and patient's Body Helix   Post procedural instructions including recommending icing and warning signs for infection were reviewed. This procedure was well tolerated and there were no complications.   IMPRESSION: Succesful US Guided Aspiration & injection of Bilateral Knees    ++++++++++++++++++++++++++++++++++++++++++++ Orders & Meds: Orders Placed This Encounter  Procedures  . US GUIDED NEEDLE PLACEMENT(NO LINKED CHARGES)    No orders of the defined types were placed in this encounter.   ++++++++++++++++++++++++++++++++++++++++++++ Follow-up: Return in about 10 days (around 11/14/2016).   Pertinent documentation may be included in additional procedure notes, imaging studies, problem based documentation and patient instructions. Please see these sections of the encounter for additional information regarding this visit. CMA/ATC served as Education administrator during this visit. History, Physical, and Plan performed by medical provider. Documentation and orders reviewed and attested to.      Gerda Diss, [facility] Sports  Medicine Physician

## 2016-11-04 NOTE — Patient Instructions (Signed)

## 2016-11-11 DIAGNOSIS — L309 Dermatitis, unspecified: Secondary | ICD-10-CM | POA: Diagnosis not present

## 2016-11-11 DIAGNOSIS — L719 Rosacea, unspecified: Secondary | ICD-10-CM | POA: Diagnosis not present

## 2016-11-13 ENCOUNTER — Ambulatory Visit: Payer: Medicare Other | Admitting: Sports Medicine

## 2016-11-20 ENCOUNTER — Ambulatory Visit (INDEPENDENT_AMBULATORY_CARE_PROVIDER_SITE_OTHER): Payer: Medicare Other | Admitting: Sports Medicine

## 2016-11-20 ENCOUNTER — Ambulatory Visit: Payer: Self-pay

## 2016-11-20 VITALS — BP 114/78 | HR 68 | Ht 61.5 in | Wt 206.6 lb

## 2016-11-20 DIAGNOSIS — M25561 Pain in right knee: Secondary | ICD-10-CM | POA: Diagnosis not present

## 2016-11-20 DIAGNOSIS — M25562 Pain in left knee: Secondary | ICD-10-CM

## 2016-11-20 DIAGNOSIS — G8929 Other chronic pain: Secondary | ICD-10-CM | POA: Diagnosis not present

## 2016-11-20 DIAGNOSIS — M170 Bilateral primary osteoarthritis of knee: Secondary | ICD-10-CM

## 2016-11-20 NOTE — Progress Notes (Signed)
OFFICE VISIT NOTE [name] at [place]  [name] - 71 y.o. female MRN [id number]  Date of birth: 12-Apr-1945  Visit Date: 11/20/2016  PCP: Vernie Shanks, MD   Referred by: Vernie Shanks, MD  Thalia Bloodgood PT, LAT, ATC acting as scribe for Dr. Paulla Fore.  SUBJECTIVE:   Chief Complaint  Patient presents with  . Follow-up    B knee pain   HPI: As below and per problem based documentation when appropriate.  [name] is an established pt presenting today for f/u of her B knee pain.  She was last seen on 11/04/16 and had injections into both knees.  Pt states that her knees felt terrible after her last set of injections.  She states that she iced her knees and reports that they slowly started to feel better and that it took about 8 days for her to calm down.  She rates her pain as a 2/10 today.    ROS  Otherwise per HPI.  HISTORY & PERTINENT PRIOR DATA:  No specialty comments available. She reports that she quit smoking about 38 years ago. Her smoking use included cigarettes. she has never used smokeless tobacco. No results for input(s): HGBA1C, LABURIC, CREATINE in the last 8760 hours.  Invalid input(s): CR Allergies reviewed per EMR Prior to Admission medications   Medication Sig Start Date End Date Taking? Authorizing Provider  aspirin 81 MG chewable tablet TAKE 1 TABLET TWICE A DAY 11/13/15   Mcarthur Rossetti, MD  clobetasol cream (TEMOVATE) 0.05 %  11/09/16   [provider]  diclofenac sodium (VOLTAREN) 1 % GEL Apply topically to affected area qid 05/05/16   Gerda Diss, DO  hydrocortisone 2.5 % ointment APPLY TO AFFECTED AREAS EVERY DAY 11/15/16   [provider]  ketoconazole (NIZORAL) 2 % cream  04/10/16   [provider]  lisinopril-hydrochlorothiazide (PRINZIDE,ZESTORETIC) 10-12.5 MG per tablet Take 1 tablet by mouth daily.      [provider]    Patient Active Problem List   Diagnosis Date Noted  . Osteoarthritis of both knees 10/31/2015  . Osteoarthritis of right hip 10/01/2015  . Status post total replacement of right hip 10/01/2015  . Right hip pain 08/24/2015  . Cellulitis 06/26/2010   Past Medical History:  Diagnosis Date  . Arthritis   . Asthma   . Hypertension   . Seasonal allergies    Family History  Problem Relation Age of Onset  . Cancer Mother   . Breast cancer Neg Hx    Past Surgical History:  Procedure Laterality Date  . ELBOW ARTHROSCOPY WITH TENDON RECONSTRUCTION    . FOOT SURGERY    . RIGHT TOTAL HIP ARTHROPLASTY ANTERIOR APPROACH Right 10/01/2015   Performed by Mcarthur Rossetti, MD at [facility] History   Occupational History  . Not on file  Tobacco Use  . Smoking status: Former Smoker    Types: Cigarettes    Last attempt to quit: 01/05/1978    Years since quitting: 38.9  . Smokeless tobacco: Never Used  Substance and Sexual Activity  . Alcohol use: No  . Drug use: No  . Sexual activity: Not on file    OBJECTIVE:  VS:  HT:5' 1.5" (156.2 cm)   WT:206 lb 9.6 oz (93.7 kg)  BMI:38.41    BP:114/78  HR:68bpm  TEMP: ( )  RESP:96 % EXAM: Findings:  Generalized  osteoarthritic bossing.  Moderate to large, non-tense effusions Valgus deformity that is slight.    RADIOLOGY: No osteoarthritis of bilateral knees ASSESSMENT & PLAN:     ICD-10-CM   1. Chronic pain of both knees M25.561 [place] LIMITED JOINT SPACE STRUCTURES LOW BILAT(NO LINKED CHARGES)   M25.562    G89.29   2. Primary osteoarthritis of both knees M17.0    ================================================================= No problem-specific Assessment & Plan notes found for this encounter.  PROCEDURE NOTE - ULTRASOUND GUIDED ASPIRATION & INJECTION: BILATERAL KNEES - Orthovisc #2/3 Images were obtained and interpreted by myself, Teresa Coombs, DO  Images have been saved and stored to PACS system. Images obtained  on: GE S7 Ultrasound machine  ULTRASOUND FINDINGS: Large effusions bilaterally with marked synovitis  DESCRIPTION OF PROCEDURE:  The patient's clinical condition is marked by substantial pain and/or significant functional disability. Other conservative therapy has not provided relief, is contraindicated, or not appropriate. There is a reasonable likelihood that injection will significantly improve the patient's pain and/or functional impairment. After discussing the risks, benefits and expected outcomes of the injection and all questions were reviewed and answered, the patient wished to undergo the above named procedure. Verbal consent was obtained. The ultrasound was used to identify the target structure and adjacent neurovascular structures. The skin was then prepped in sterile fashion and the target structure was injected under direct visualization using sterile technique as below:  RIGHT: PREP: Alcohol, Ethel Chloride, 3cc 1% lidocaine on 25 needle APPROACH: Superiolateral, stopcock technique, 18g 1.5" needle INJECTATE: 2cc Orthovisc ASPIRATE: Straw-colored fluid, 35 cc DRESSING: Band-Aid and patient's own personal Body Helix  LEFT: PREP: Alcohol, Ethel Chloride, 3cc 1% lidocaine on 25 needle APPROACH: Superiolateral, stopcock technique, 18g 1.5" needle INJECTATE: 2 cc Orthovisc ASPIRATE: Straw-colored fluid, 48 cc DRESSING: Band-Aid and patient's own personal Body Helix   Post procedural instructions including recommending icing and warning signs for infection were reviewed. This procedure was well tolerated and there were no complications.   IMPRESSION: Succesful US Guided Aspiration & injection of Bilateral Knees  =================================================================  ================================================================= Future Appointments  Date Time Provider [place]  11/30/2016  2:00 PM Gerda Diss, DO [facility] None    Follow-up:  Return in about 10 days (around 11/30/2016). For 3/3 of Orthovisc  CMA/ATC served as Education administrator during this visit. History, Physical, and Plan performed by medical provider. Documentation and orders reviewed and attested to.      Teresa Coombs, [facility] Physician

## 2016-11-20 NOTE — Patient Instructions (Signed)

## 2016-11-21 ENCOUNTER — Encounter: Payer: Self-pay | Admitting: Sports Medicine

## 2016-11-21 NOTE — Procedures (Signed)
PROCEDURE NOTE - ULTRASOUND GUIDED ASPIRATION & INJECTION: BILATERAL KNEES - Orthovisc #2/3 Images were obtained and interpreted by myself, Teresa Coombs, DO  Images have been saved and stored to PACS system. Images obtained on: GE S7 Ultrasound machine  ULTRASOUND FINDINGS: Large effusions bilaterally with marked synovitis  DESCRIPTION OF PROCEDURE:  The patient's clinical condition is marked by substantial pain and/or significant functional disability. Other conservative therapy has not provided relief, is contraindicated, or not appropriate. There is a reasonable likelihood that injection will significantly improve the patient's pain and/or functional impairment. After discussing the risks, benefits and expected outcomes of the injection and all questions were reviewed and answered, the patient wished to undergo the above named procedure. Verbal consent was obtained. The ultrasound was used to identify the target structure and adjacent neurovascular structures. The skin was then prepped in sterile fashion and the target structure was injected under direct visualization using sterile technique as below:  RIGHT: PREP: Alcohol, Ethel Chloride, 3cc 1% lidocaine on 25 needle APPROACH: Superiolateral, stopcock technique, 18g 1.5" needle INJECTATE: 2cc Orthovisc ASPIRATE: Straw-colored fluid, 35 cc DRESSING: Band-Aid and patient's own personal Body Helix  LEFT: PREP: Alcohol, Ethel Chloride, 3cc 1% lidocaine on 25 needle APPROACH: Superiolateral, stopcock technique, 18g 1.5" needle INJECTATE: 2 cc Orthovisc ASPIRATE: Straw-colored fluid, 48 cc DRESSING: Band-Aid and patient's own personal Body Helix   Post procedural instructions including recommending icing and warning signs for infection were reviewed. This procedure was well tolerated and there were no complications.   IMPRESSION: Succesful US Guided Aspiration & injection of Bilateral Knees

## 2016-11-30 ENCOUNTER — Encounter: Payer: Self-pay | Admitting: Sports Medicine

## 2016-11-30 ENCOUNTER — Ambulatory Visit: Payer: Self-pay

## 2016-11-30 ENCOUNTER — Ambulatory Visit (INDEPENDENT_AMBULATORY_CARE_PROVIDER_SITE_OTHER): Payer: Medicare Other | Admitting: Sports Medicine

## 2016-11-30 VITALS — BP 124/84 | HR 68 | Ht 61.5 in | Wt 202.4 lb

## 2016-11-30 DIAGNOSIS — M170 Bilateral primary osteoarthritis of knee: Secondary | ICD-10-CM

## 2016-11-30 NOTE — Procedures (Signed)
PROCEDURE NOTE - ULTRASOUND GUIDED ASPIRATION & INJECTION: Bilateral Orthovisc No. 3 of 3 Images were obtained and interpreted by myself, Teresa Coombs, DO  Images have been saved and stored to PACS system. Images obtained on: GE S7 Ultrasound machine  ULTRASOUND FINDINGS: Generalized synovitis.  With large effusions bilaterally.  DESCRIPTION OF PROCEDURE:  The patient's clinical condition is marked by substantial pain and/or significant functional disability. Other conservative therapy has not provided relief, is contraindicated, or not appropriate. There is a reasonable likelihood that injection will significantly improve the patient's pain and/or functional impairment. After discussing the risks, benefits and expected outcomes of the injection and all questions were reviewed and answered, the patient wished to undergo the above named procedure. Verbal consent was obtained. The ultrasound was used to identify the target structure and adjacent neurovascular structures. The skin was then prepped in sterile fashion and the target structure was injected under direct visualization using sterile technique as below:  RIGHT: PREP: Alcohol, Ethel Chloride, 5cc 1% lidocaine on 25g 1.5 in. needle APPROACH: superiolateral, stopcock technique, 18g 1.5in. INJECTATE:  2cc/44mg  of Orthovisc/hyaluronic acid  ASPIRATE: 45 cc of slightly orange synovial fluid DRESSING: Band-Aid and 6" Ace-Wrap  LEFT: PREP: Alcohol, Ethel Chloride, 5cc 1% lidocaine on 25g 1.5 in. needle APPROACH: superiolateral, stopcock technique, 18g 1.5in. INJECTATE:  2cc/44mg  of Orthovisc/hyaluronic acid  ASPIRATE: 35 cc of straw yellow synovial fluid DRESSING: Band-Aid and 6" Ace-Wrap  Post procedural instructions including recommending icing and warning signs for infection were reviewed. This procedure was well tolerated and there were no complications.   IMPRESSION: Succesful US Guided Aspiration & injection

## 2016-11-30 NOTE — Progress Notes (Signed)
  [name], [name] at [place]  ZYKIA WALLA - 71 y.o. female MRN [id number]  Date of birth: 1945-11-08 [name] PT, LAT, ATC is acting as a Education administrator today.  SUBJECTIVE:  [name] is here for evaluation of Follow-up (B knee pain)  Compared to the last office visit on 11/20/16, her previously described symptoms are improving.  She states that she has not had any issues since her last injection. Current symptoms are resolved. She has been receiving Orthovisc injections and was previously prescribed topical Pennsaid.    ROS Denies night time disturbances. Denies fevers, chills, or night sweats. Denies unexplained weight loss. Denies personal history of cancer. Denies changes in bowel or bladder habits. Denies recent unreported falls. Denies new or worsening dyspnea or wheezing. Denies headaches or dizziness.  Denies numbness, tingling or weakness  In the extremities.  Denies dizziness or presyncopal episodes Denies lower extremity edema    HISTORY & PERTINENT PRIOR DATA:  Prior History reviewed and updated per electronic medical record. Significant history, findings, studies and interim changes include: No additional findings.  reports that she quit smoking about 38 years ago. Her smoking use included cigarettes. she has never used smokeless tobacco. No results for input(s): HGBA1C, LABURIC, CREATINE in the last 8760 hours. Problem  Osteoarthritis of Right Hip (Resolved)  Right Hip Pain (Resolved)  Cellulitis (Resolved)     OBJECTIVE:  VS:  HT:5' 1.5" (156.2 cm)   WT:202 lb 6.4 oz (91.8 kg)  BMI:37.63    BP:124/84  HR:68bpm  TEMP: ( )  RESP:97 %  PHYSICAL EXAM: Bilateral knees have slight genu valgus deformity.  She has good flexion and extension.  Large, non-tense effusion.  No significant overlying skin changes ASSESSMENT & PLAN:   1. Primary osteoarthritis of both knees     Plan: Orthovisc No. 3 of 3   ++++++++++++++++++++++++++++++++++++++++++++ Orders:  Orders Placed This Encounter  Procedures  . US GUIDED NEEDLE PLACEMENT(NO LINKED CHARGES)    Meds:  No orders of the defined types were placed in this encounter.   ++++++++++++++++++++++++++++++++++++++++++++ Follow-up: Return in about 3 months (around 03/02/2017).   Pertinent documentation may be included in additional procedure notes, imaging studies, problem based documentation and patient instructions. Please see these sections of the encounter for additional information regarding this visit. CMA/ATC served as Education administrator during this visit. History, Physical, and Plan performed by medical provider. Documentation and orders reviewed and attested to.      [name] Sports Medicine Physician    11/30/2016 5:25 PM

## 2016-11-30 NOTE — Patient Instructions (Signed)
You had an injection today.  Things to be aware of after injection are listed below: . You may experience no significant improvement or even a slight worsening in your symptoms during the first 24 to 48 hours.  After that we expect your symptoms to improve gradually over the next 2 weeks for the medicine to have its maximal effect.  You should continue to have improvement out to 6 weeks after your injection. . Dr. Javarian Jakubiak recommends icing the site of the injection for 20 minutes  1-2 times the day of your injection . You may shower but no swimming, tub bath or Jacuzzi for 24 hours. . If your bandage falls off this does not need to be replaced.  It is appropriate to remove the bandage after 4 hours. . You may resume light activities as tolerated unless otherwise directed per Dr. Cathalina Barcia during your visit  POSSIBLE STEROID SIDE EFFECTS:  Side effects from injectable steroids tend to be less than when taken orally however you may experience some of the symptoms listed below.  If experienced these should only last for a short period of time. Change in menstrual flow  Edema (swelling)  Increased appetite Skin flushing (redness)  Skin rash/acne  Thrush (oral) Yeast vaginitis    Increased sweating  Depression Increased blood glucose levels Cramping and leg/calf  Euphoria (feeling happy)  POSSIBLE PROCEDURE SIDE EFFECTS: The side effects of the injection are usually fairly minimal however if you may experience some of the following side effects that are usually self-limited and will is off on their own.  If you are concerned please feel free to call the office with questions:  Increased numbness or tingling  Nausea or vomiting  Swelling or bruising at the injection site   Please call our office if if you experience any of the following symptoms over the next week as these can be signs of infection:   Fever greater than 100.5F  Significant swelling at the injection site  Significant redness or drainage  from the injection site  If after 2 weeks you are continuing to have worsening symptoms please call our office to discuss what the next appropriate actions should be including the potential for a return office visit or other diagnostic testing.    

## 2016-12-16 ENCOUNTER — Encounter: Payer: Self-pay | Admitting: Sports Medicine

## 2016-12-16 NOTE — Assessment & Plan Note (Signed)
Orthovisc 1 performed today.  ++++++++++++++++++++++++++++++++++++++++++++ PROCEDURE NOTE - ULTRASOUND GUIDED ASPIRATION & INJECTION: BILATERAL KNEES - Orthovisc #1/3 Images were obtained and interpreted by myself, Teresa Coombs, DO  Images have been saved and stored to PACS system. Images obtained on: GE S7 Ultrasound machine  ULTRASOUND FINDINGS: Moderate Effusions  DESCRIPTION OF PROCEDURE:  The patient's clinical condition is marked by substantial pain and/or significant functional disability. Other conservative therapy has not provided relief, is contraindicated, or not appropriate. There is a reasonable likelihood that injection will significantly improve the patient's pain and/or functional impairment. After discussing the risks, benefits and expected outcomes of the injection and all questions were reviewed and answered, the patient wished to undergo the above named procedure. Verbal consent was obtained. The ultrasound was used to identify the target structure and adjacent neurovascular structures. The skin was then prepped in sterile fashion and the target structure was injected under direct visualization using sterile technique as below:  RIGHT: PREP: Alcohol, Ethel Chloride, 3cc 1% lidocaine on 25 needle APPROACH: Superiolateral, stopcock technique, 18g 1.5" needle INJECTATE: Orthovisc ASPIRATE: 44 cc of serous fluid DRESSING: Band-Aid Body Helix compression sleeve  LEFT: PREP: Alcohol, Ethel Chloride, 3cc 1% lidocaine on 25 needle APPROACH: Superiolateral, stopcock technique, 18g 1.5" needle INJECTATE: Orthovisc ASPIRATE: 35 cc of serous fluid DRESSING: Band-Aid and patient's Body Helix   Post procedural instructions including recommending icing and warning signs for infection were reviewed. This procedure was well tolerated and there were no complications.   IMPRESSION: Succesful US Guided Aspiration & injection of Bilateral Knees

## 2016-12-28 DIAGNOSIS — J0100 Acute maxillary sinusitis, unspecified: Secondary | ICD-10-CM | POA: Diagnosis not present

## 2017-02-22 DIAGNOSIS — M2042 Other hammer toe(s) (acquired), left foot: Secondary | ICD-10-CM | POA: Diagnosis not present

## 2017-02-22 DIAGNOSIS — M2041 Other hammer toe(s) (acquired), right foot: Secondary | ICD-10-CM | POA: Diagnosis not present

## 2017-03-04 DIAGNOSIS — J45909 Unspecified asthma, uncomplicated: Secondary | ICD-10-CM | POA: Diagnosis not present

## 2017-03-04 DIAGNOSIS — I10 Essential (primary) hypertension: Secondary | ICD-10-CM | POA: Diagnosis not present

## 2017-03-04 DIAGNOSIS — E785 Hyperlipidemia, unspecified: Secondary | ICD-10-CM | POA: Diagnosis not present

## 2017-03-04 DIAGNOSIS — Z6837 Body mass index (BMI) 37.0-37.9, adult: Secondary | ICD-10-CM | POA: Diagnosis not present

## 2017-03-04 DIAGNOSIS — E669 Obesity, unspecified: Secondary | ICD-10-CM | POA: Diagnosis not present

## 2017-03-09 ENCOUNTER — Encounter: Payer: Self-pay | Admitting: Sports Medicine

## 2017-03-09 ENCOUNTER — Ambulatory Visit (INDEPENDENT_AMBULATORY_CARE_PROVIDER_SITE_OTHER): Payer: Medicare Other | Admitting: Sports Medicine

## 2017-03-09 VITALS — BP 118/80 | HR 67 | Ht 61.5 in | Wt 205.6 lb

## 2017-03-09 DIAGNOSIS — M170 Bilateral primary osteoarthritis of knee: Secondary | ICD-10-CM

## 2017-03-09 DIAGNOSIS — M25561 Pain in right knee: Secondary | ICD-10-CM

## 2017-03-09 DIAGNOSIS — M25462 Effusion, left knee: Secondary | ICD-10-CM

## 2017-03-09 DIAGNOSIS — M25562 Pain in left knee: Secondary | ICD-10-CM | POA: Diagnosis not present

## 2017-03-09 DIAGNOSIS — M25461 Effusion, right knee: Secondary | ICD-10-CM | POA: Diagnosis not present

## 2017-03-09 DIAGNOSIS — G8929 Other chronic pain: Secondary | ICD-10-CM

## 2017-03-09 NOTE — Progress Notes (Signed)
[name]. [name], [facility] at [place]  [name] - 72 y.o. female MRN [id number]  Date of birth: 1945-09-15  Visit Date: 03/09/2017  PCP: Vernie Shanks, MD   Referred by: Vernie Shanks, MD   Scribe for today's visit: Josepha Pigg, CMA     SUBJECTIVE:  [name] "[name]" is here for Follow-up (osteoarthritis bilateral knees)  10/31/15: Ambulates with a cane. Patient states both knees are stiff. No swelling.  States she had a Right  Hip replacement for 4weeks ago with Dr. Ninfa Linden.  Here for Bilateral Injections.   05/04/16: [name] is here today for her 2nd [place]. She tolerated 1st series well with much improvement.  11/04/16: [name] is an established presenting today for a 6 month f/u of her B knee pain / OA.  Pt's last visit w/ Dr. Paulla Fore was on 05/04/2016.  Pt states that her knees have been doing very well.  She notes that they are stiff.  She states that she wears the Body Helix sleeves when she needs them.  She reports having some pain but notes that the pain is manageable.  She feels like her knees are definitely better today compared to how they've felt at prior visits.  She also feels like her knees are also better after having R hip replaced in Sept. 2017.  11/20/16: [name] is an established pt presenting today for f/u of her B knee pain.  She was last seen on 11/04/16 and had injections into both knees.  Pt states that her knees felt terrible after her last set of injections.  She states that she iced her knees and reports that they slowly started to feel better and that it took about 8 days for her to calm down.  She rates her pain as a 2/10 today.  11/30/16: Compared to the last office visit on 11/20/16, her previously described symptoms are improving.  She states that she has not had any issues since her last injection. Current symptoms are resolved. She has been  receiving Orthovisc injections and was previously prescribed topical Pennsaid.   03/09/17: Compared to the last office visit, her previously described symptoms are improving. She did notice some pain in her knees while traveling but aside from that she reports that she has been doing very well.  Current symptoms are mild & are non-radiating. She denies swelling around either knee.  She had Orthovisc injection 11/30/16 and has been doing very well since then. She has not needed to use oral or topical medication for pain recently.  She does report numbness on the lateral aspect of the R leg which started after hip surgery. This does seem to be improving over time.    ROS Denies night time disturbances. Denies fevers, chills, or night sweats. Denies unexplained weight loss. Denies personal history of cancer. Denies changes in bowel or bladder habits. Denies recent unreported falls. Denies new or worsening dyspnea or wheezing. Denies headaches or dizziness.  Denies numbness, tingling or weakness  In the extremities.  Denies dizziness or presyncopal episodes Denies lower extremity edema    HISTORY & PERTINENT PRIOR DATA:  Prior History reviewed and updated per electronic medical record.  Significant history, findings, studies and interim changes include:  reports that she quit smoking about 39 years ago. Her smoking use included cigarettes. she has never used smokeless tobacco. No results for input(s): HGBA1C, LABURIC, CREATINE in the last 8760  hours. No specialty comments available. No problems updated.  OBJECTIVE:  VS:  HT:5' 1.5" (156.2 cm)   WT:205 lb 9.6 oz (93.3 kg)  BMI:38.22    BP:118/80  HR:67bpm  TEMP: ( )  RESP:99 %   PHYSICAL EXAM: Constitutional: WDWN, Non-toxic appearing. Psychiatric: Alert & appropriately interactive.  Not depressed or anxious appearing. Respiratory: No increased work of breathing.  Trachea Midline Eyes: Pupils are equal.  EOM intact without  nystagmus.  No scleral icterus  NEUROVASCULAR exam: No clubbing or cyanosis appreciated No significant venous stasis changes Capillary Refill: normal, less than 2 seconds   Bilateral knees are overall significantly less swollen than in the past.  She has only a trace effusion bilaterally she does have a valgus deformity of the left knee greater than the right.  Ligamentously she is stable with only 3-4 mm of opening with varus testing bilaterally.  Some pain with patellar grind but this is minimal.  Overall good flexion and extension.   ASSESSMENT & PLAN:   1. Effusion of both knee joints   2. Primary osteoarthritis of both knees   3. Chronic pain of both knees    ++++++++++++++++++++++++++++++++++++++++++++ Orders & Meds: No orders of the defined types were placed in this encounter.  No orders of the defined types were placed in this encounter.   ++++++++++++++++++++++++++++++++++++++++++++ PLAN:   Findings:  She is doing quite well.  At this time we will hold off any further injections but will plan for repeat Visco supplementation injections at the 70-month mark.  In the interim she does have an acute flareup I would like to get her preapproved for Zilretta long-acting corticosteroid as this may be more effective anti-inflammatory treatment than standard steroid.  >50% of this 25 minute visit spent in direct patient counseling and/or coordination of care.  Discussion was focused on education regarding the in discussing the pathoetiology and anticipated clinical course of the above condition.  We did discuss the mechanism of the long-acting Zilretta and why that given her chronic effusions this may be beneficial.  She voiced understanding and is agreeable that with an acute flareup she would like to try this again but would like to stick with Visco supplementation if possible.   No problem-specific Assessment & Plan notes found for this encounter.   Follow-up: Return in about 3 months  (around 05/31/2017), or repeat OrthoVisc Injections.   Pertinent documentation may be included in additional procedure notes, imaging studies, problem based documentation and patient instructions. Please see these sections of the encounter for additional information regarding this visit. CMA/ATC served as Education administrator during this visit. History, Physical, and Plan performed by medical provider. Documentation and orders reviewed and attested to.      Gerda Diss, [facility] [facility] Physician

## 2017-03-09 NOTE — Patient Instructions (Signed)
Call if you need to be sooner.  We will run numbers for the Zilretta (long acting steroid). Your last injection was 11/30/16.  We can see you back for the Orthovisc injections again on 05/31/2017 if you would like.

## 2017-04-29 DIAGNOSIS — M2041 Other hammer toe(s) (acquired), right foot: Secondary | ICD-10-CM | POA: Diagnosis not present

## 2017-04-29 DIAGNOSIS — M2042 Other hammer toe(s) (acquired), left foot: Secondary | ICD-10-CM | POA: Diagnosis not present

## 2017-04-29 DIAGNOSIS — L851 Acquired keratosis [keratoderma] palmaris et plantaris: Secondary | ICD-10-CM | POA: Diagnosis not present

## 2017-05-05 DIAGNOSIS — L851 Acquired keratosis [keratoderma] palmaris et plantaris: Secondary | ICD-10-CM | POA: Diagnosis not present

## 2017-06-04 DIAGNOSIS — M2042 Other hammer toe(s) (acquired), left foot: Secondary | ICD-10-CM | POA: Diagnosis not present

## 2017-06-04 DIAGNOSIS — M2041 Other hammer toe(s) (acquired), right foot: Secondary | ICD-10-CM | POA: Diagnosis not present

## 2017-06-07 ENCOUNTER — Encounter: Payer: Self-pay | Admitting: Sports Medicine

## 2017-06-07 ENCOUNTER — Ambulatory Visit (INDEPENDENT_AMBULATORY_CARE_PROVIDER_SITE_OTHER): Payer: Medicare Other | Admitting: Sports Medicine

## 2017-06-07 VITALS — BP 122/82 | HR 62 | Ht 61.5 in | Wt 212.6 lb

## 2017-06-07 DIAGNOSIS — M25461 Effusion, right knee: Secondary | ICD-10-CM

## 2017-06-07 DIAGNOSIS — M25462 Effusion, left knee: Secondary | ICD-10-CM | POA: Diagnosis not present

## 2017-06-07 DIAGNOSIS — G8929 Other chronic pain: Secondary | ICD-10-CM

## 2017-06-07 DIAGNOSIS — M25562 Pain in left knee: Secondary | ICD-10-CM

## 2017-06-07 DIAGNOSIS — M170 Bilateral primary osteoarthritis of knee: Secondary | ICD-10-CM

## 2017-06-07 DIAGNOSIS — M25561 Pain in right knee: Secondary | ICD-10-CM

## 2017-06-07 NOTE — Progress Notes (Signed)
[name]. Brendolyn Stockley, [place] at [facility]  VIRGIA KELNER - 72 y.o. female MRN [id number]  Date of birth: 14-May-1945  Visit Date: 06/07/2017  PCP: Vernie Shanks, MD   Referred by: Vernie Shanks, MD  Scribe for today's visit: Josepha Pigg, CMA     SUBJECTIVE:  [name] is here for No chief complaint on file.  10/31/15: Ambulates with a cane. Patient states both knees are stiff. No swelling.  States she had a Right  Hip replacement for 4weeks ago with Dr. Ninfa Linden.  Here for Bilateral Injections.   05/04/16: Kerstie is here today for her 2nd [place]. She tolerated 1st series well with much improvement.  11/04/16: Ms. Coaxum is an established presenting today for a 6 month f/u of her B knee pain / OA.  Pt's last visit w/ Dr. Paulla Fore was on 05/04/2016.  Pt states that her knees have been doing very well.  She notes that they are stiff.  She states that she wears the Body Helix sleeves when she needs them.  She reports having some pain but notes that the pain is manageable.  She feels like her knees are definitely better today compared to how they've felt at prior visits.  She also feels like her knees are also better after having R hip replaced in Sept. 2017.  11/20/16: Neola is an established pt presenting today for f/u of her B knee pain.  She was last seen on 11/04/16 and had injections into both knees.  Pt states that her knees felt terrible after her last set of injections.  She states that she iced her knees and reports that they slowly started to feel better and that it took about 8 days for her to calm down.  She rates her pain as a 2/10 today.  11/30/16: Compared to the last office visit on 11/20/16, her previously described symptoms are improving.  She states that she has not had any issues since her last injection. Current symptoms are resolved. She has been receiving  Orthovisc injections and was previously prescribed topical Pennsaid.   03/09/17: Compared to the last office visit, her previously described symptoms are improving. She did notice some pain in her knees while traveling but aside from that she reports that she has been doing very well.  Current symptoms are mild & are non-radiating. She denies swelling around either knee.  She had Orthovisc injection 11/30/16 and has been doing very well since then. She has not needed to use oral or topical medication for pain recently.  She does report numbness on the lateral aspect of the R leg which started after hip surgery. This does seem to be improving over time.   06/07/2017: Compared to the last office visit, her previously described symptoms are improving, she is able to go up stairs  Current symptoms are mild & are nonradiating She is not currently taking any medications for her knee pain.   ROS Denies night time disturbances. Denies fevers, chills, or night sweats. Denies unexplained weight loss. Denies personal history of cancer. Denies changes in bowel or bladder habits. Denies recent unreported falls. Denies new or worsening dyspnea or wheezing. Denies headaches or dizziness.  Reports numbness lateral R hip.  Denies dizziness or presyncopal episodes Denies lower extremity edema    HISTORY & PERTINENT PRIOR DATA:  Prior History reviewed and updated per electronic medical record.  Significant/pertinent history, findings, studies  include:  reports that she quit smoking about 39 years ago. Her smoking use included cigarettes. She has never used smokeless tobacco. No results for input(s): HGBA1C, LABURIC, CREATINE in the last 8760 hours. 03/12/17 - Orthovisc is covered, no copay, 20% coinsurance, OOP doe not apply. Secondary will pick up medicare part B deductible and remaining coinsurance. -BSC  03/15/17 - Zilret[place] - No PA required, deductible has been met, 20% coinsurance. Secondary, Christella Scheuermann, will  cover the deductible and coinsurance. No problems updated.  OBJECTIVE:  VS:  HT:5' 1.5" (156.2 cm)   WT:212 lb 9.6 oz (96.4 kg)  BMI:39.53    BP:122/82  HR:62bpm  TEMP: ( )  RESP:96 %   PHYSICAL EXAM: Constitutional: WDWN, Non-toxic appearing. Psychiatric: Alert & appropriately interactive.  Not depressed or anxious appearing. Respiratory: No increased work of breathing.  Trachea Midline Eyes: Pupils are equal.  EOM intact without nystagmus.  No scleral icterus  Vascular Exam: warm to touch no edema  lower extremity neuro exam: unremarkable  MSK Exam: Bilateral knees with generalized osteoarthritic bossing.  Valgus deformity of the left knee which is worse than the right.  She has a moderate non-tense effusion of the left knee and small non-tense effusion on the right.  Extensor mechanism is intact.   ASSESSMENT & PLAN:   1. Effusion of both knee joints   2. Primary osteoarthritis of both knees   3. Chronic pain of both knees     PLAN: She is responded very favorably to Orthovisc series.  At this point she would like to defer any further intervention.  We will plan to have her come back at her convenience for repeat Orthovisc series shots.  We did discuss the options for Zilretta injections which she like to hold off at this at this time.  Likely will prefer Orthovisc at follow-up.  Follow-up: Return if symptoms worsen or fail to improve, for consideration of repeat injections.      Please see additional documentation for Objective, Assessment and Plan sections. Pertinent additional documentation may be included in corresponding procedure notes, imaging studies, problem based documentation and patient instructions. Please see these sections of the encounter for additional information regarding this visit.  CMA/ATC served as Education administrator during this visit. History, Physical, and Plan performed by medical provider. Documentation and orders reviewed and attested to.      Gerda Diss, [facility] [place] [facility] Physician

## 2017-07-10 IMAGING — CR DG HIP (WITH OR WITHOUT PELVIS) 1V PORT*R*
2 series · 2 of 2 positions shown · non-contrast
Comparison: 10/01/2015

CLINICAL DATA: Status post right total hip arthroplasty.

EXAM:
DG HIP (WITH OR WITHOUT PELVIS) 1V PORT RIGHT

[AP]
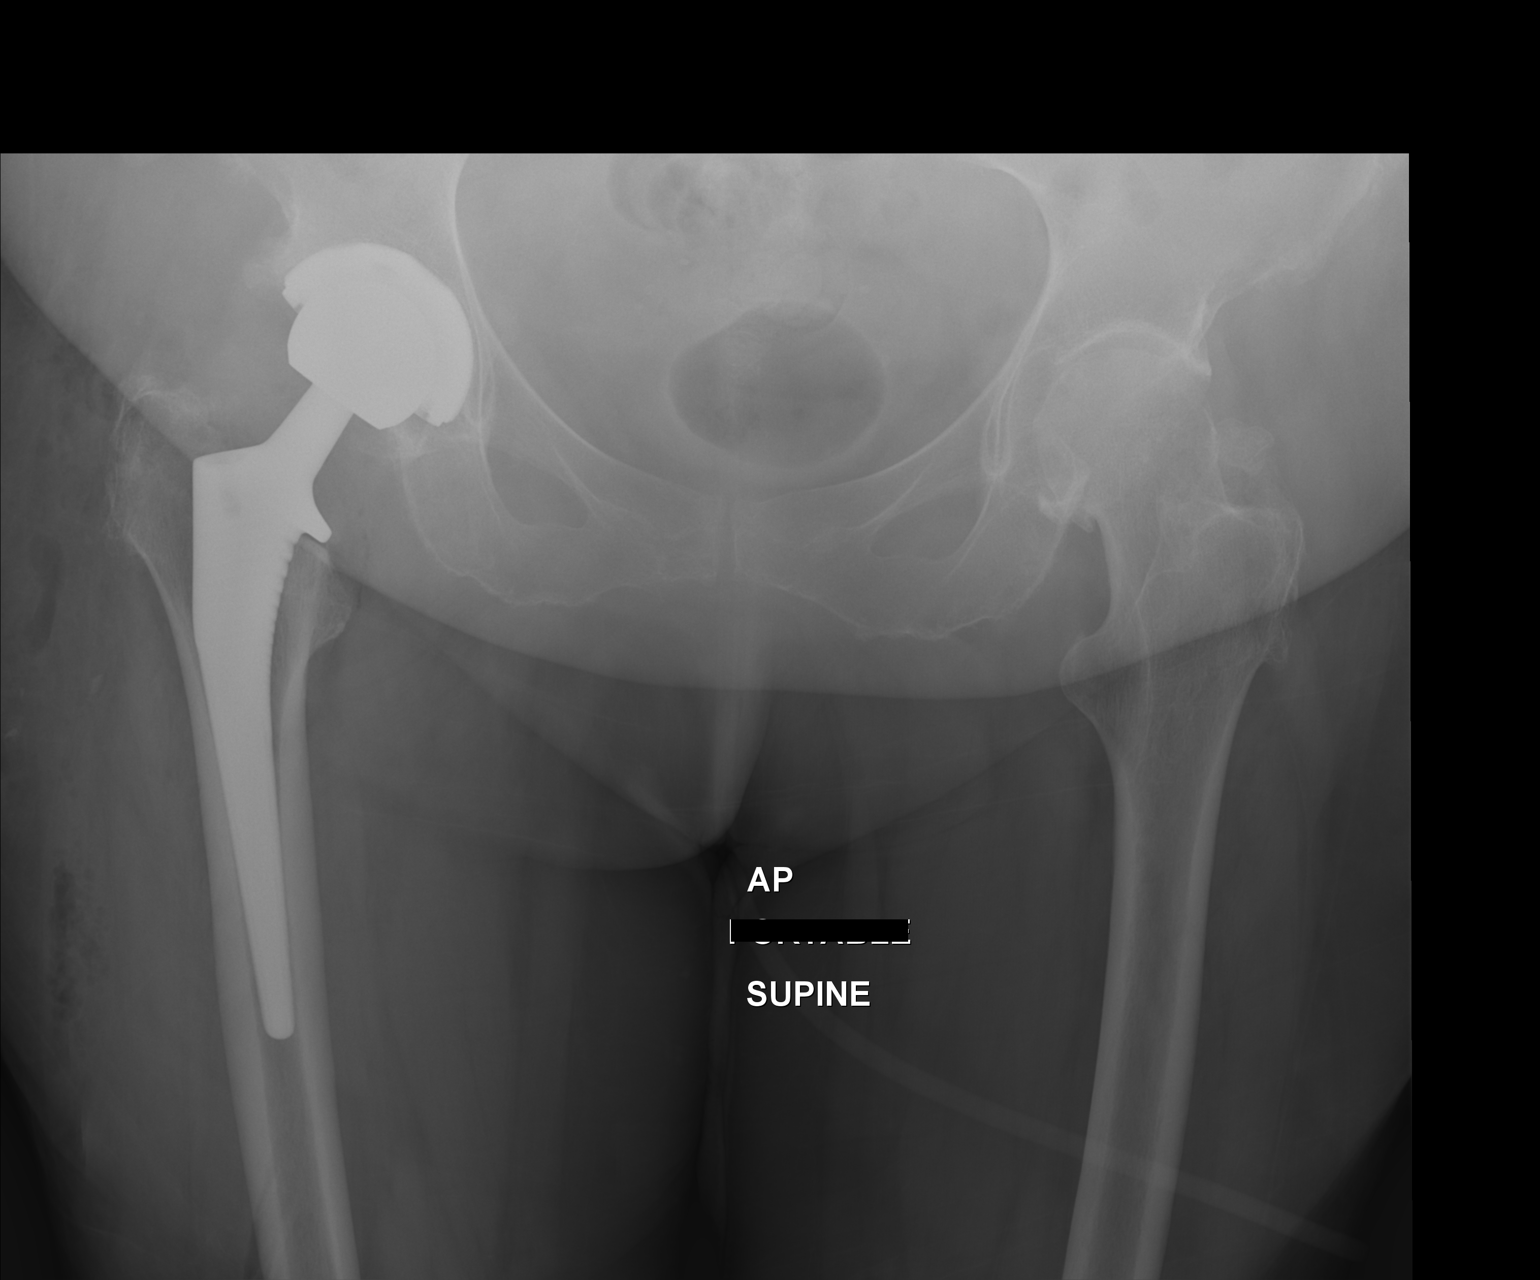

[lateral]
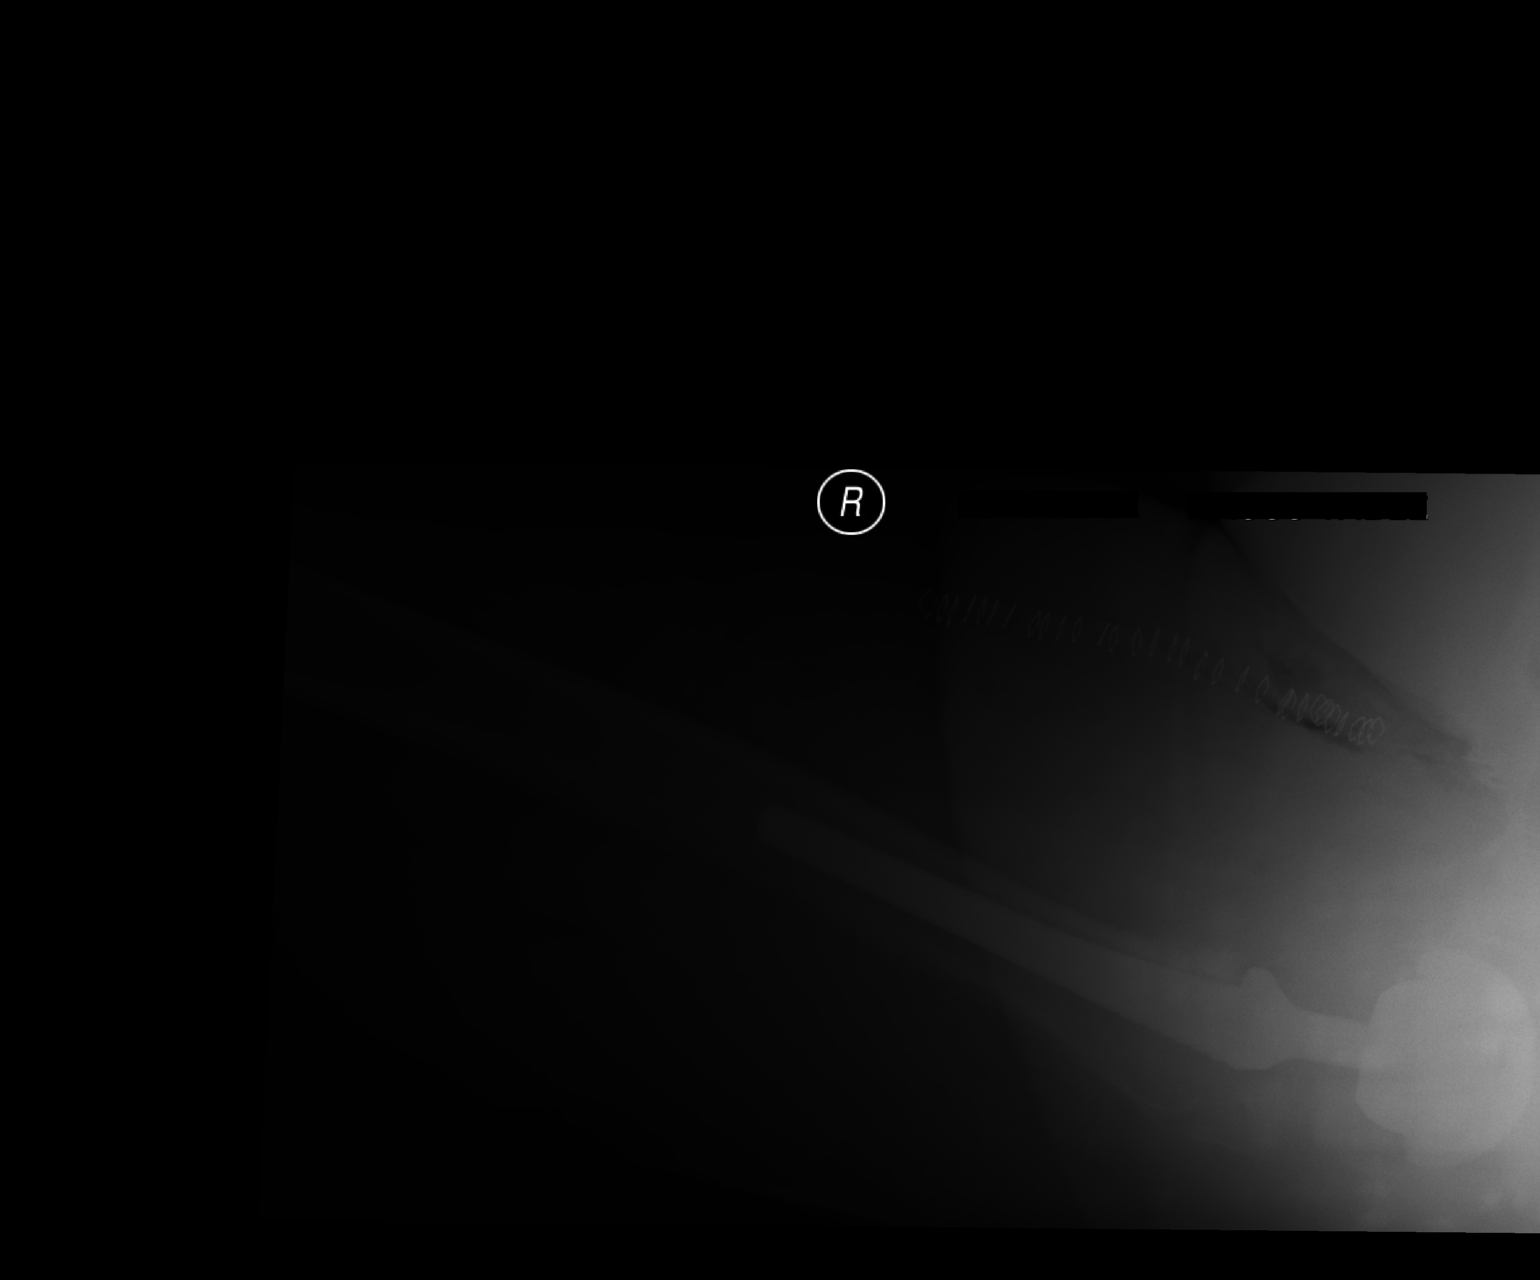

[2 of 2 positions shown; findings below may reference images not displayed]

FINDINGS: Hardware components of a right total hip arthroplasty device are
identified. No periprosthetic fracture or subluxation identified.
Moderate degenerative changes are noted involving the left hip.
IMPRESSION: Status post right hip arthroplasty. No evidence for periprosthetic
fracture or subluxation.

## 2017-07-10 IMAGING — RF DG HIP (WITH PELVIS) OPERATIVE*R*
1 series · 3 of 3 positions shown · non-contrast
Comparison: 08/06/2015

CLINICAL DATA: Total right arthroplasty anterior approach

EXAM:
DG C-ARM 61-120 MIN; OPERATIVE RIGHT HIP WITH PELVIS

[Series 1: run · 3 of 3 slices shown]
[im 1/3]
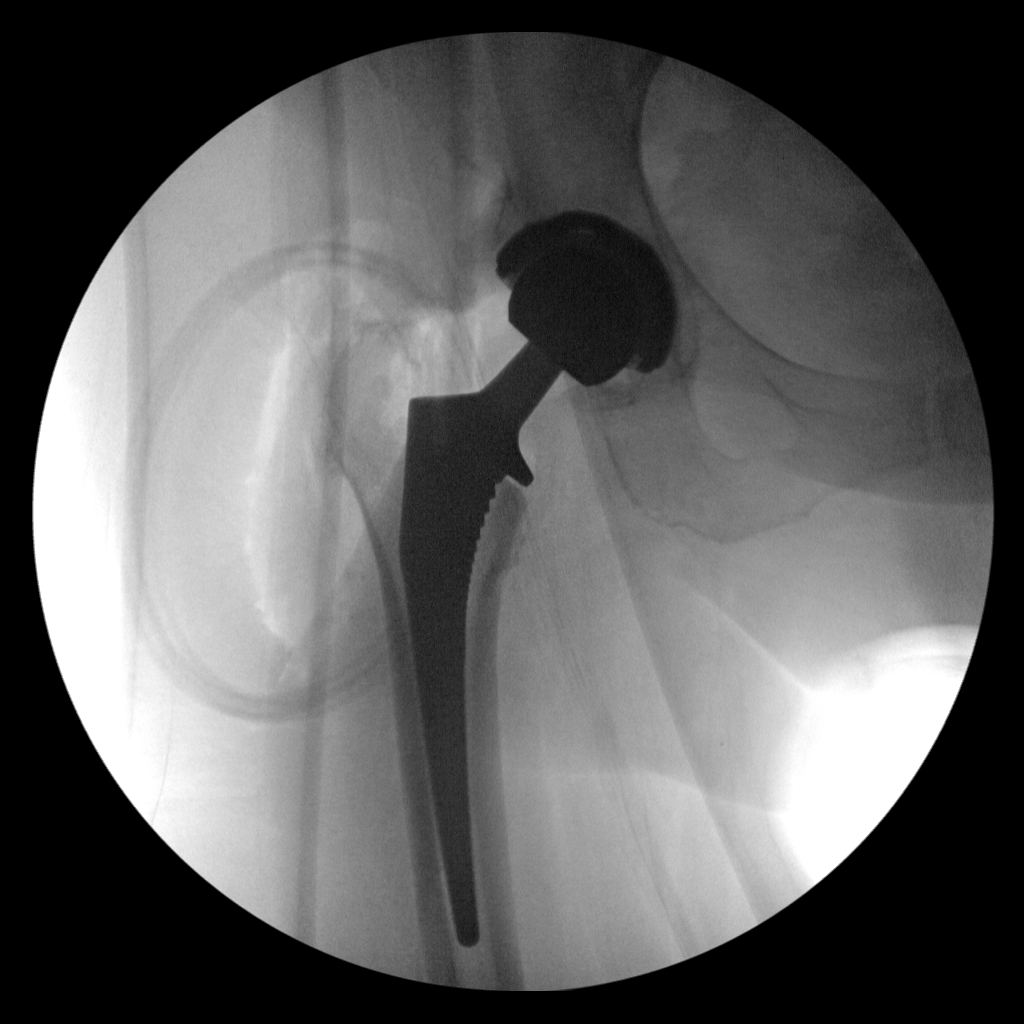
[im 2/3]
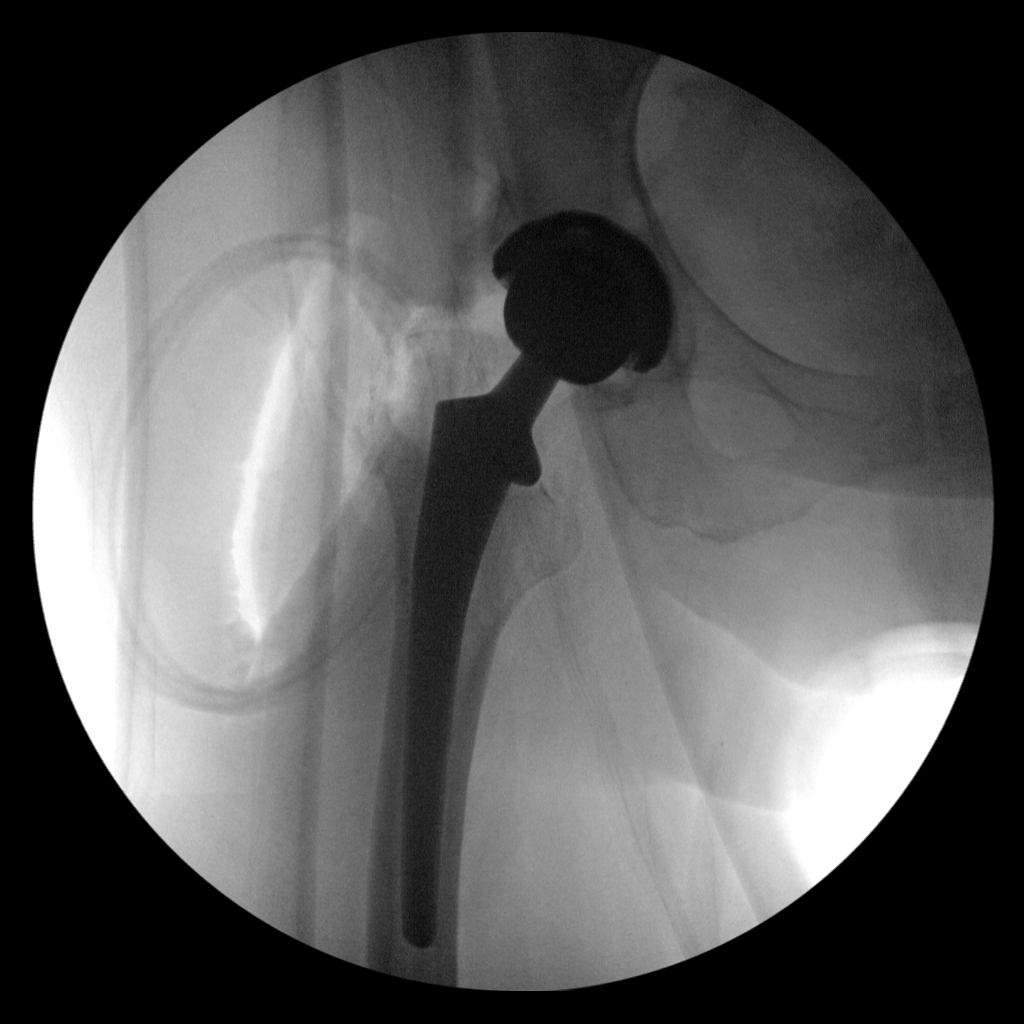
[im 3/3]
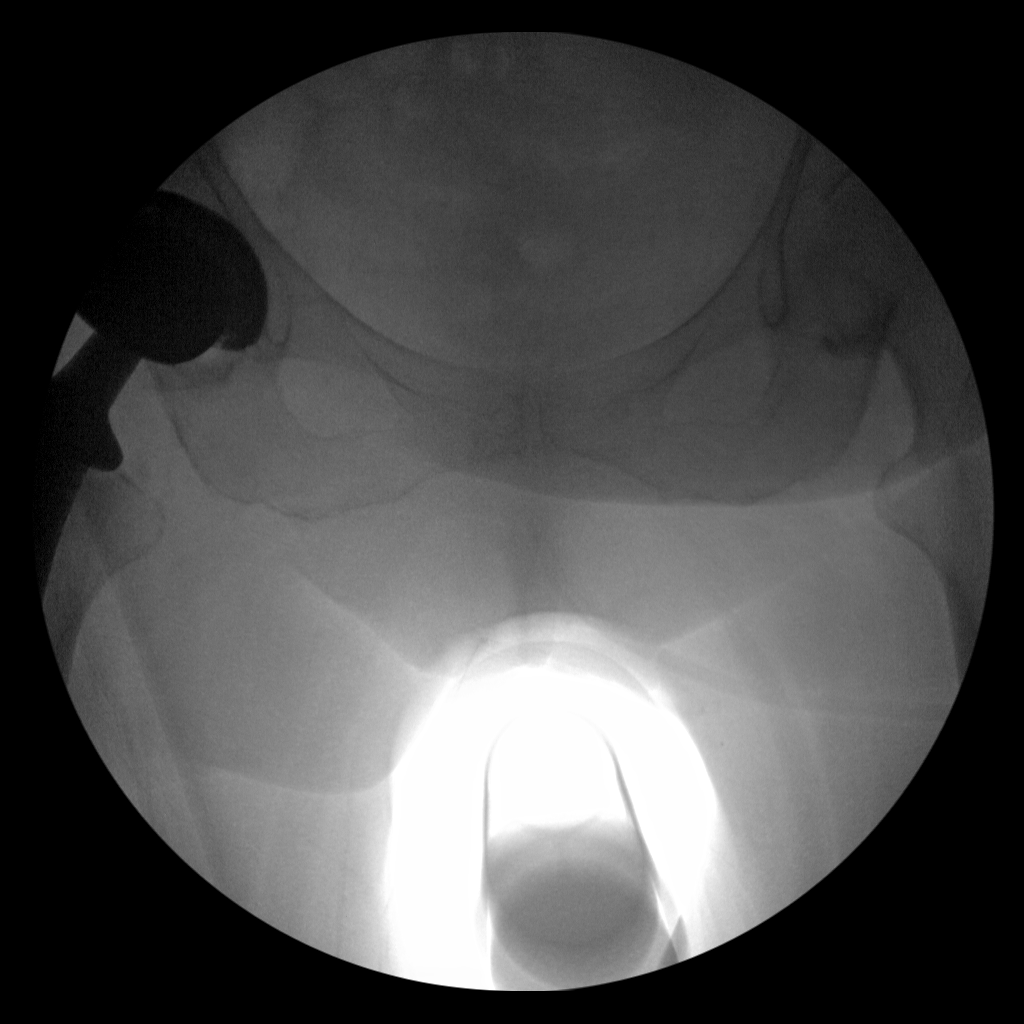

[3 of 3 positions shown; findings below may reference images not displayed]

FINDINGS: Right hip replacement in satisfactory position alignment. No
fracture or complication
IMPRESSION: Satisfactory right hip replacement

## 2017-07-17 IMAGING — US US EXTREM LOW VENOUS*R*
1 series · 14 of 24 positions shown · non-contrast
Comparison: None.

CLINICAL DATA: Pain and swelling in the right lower extremity.
Recent hip replacement.

EXAM:
RIGHT LOWER EXTREMITY VENOUS DOPPLER ULTRASOUND
TECHNIQUE: Gray-scale sonography with graded compression, as well as color
Doppler and duplex ultrasound, were performed to evaluate the deep
venous system from the level of the common femoral vein through the
popliteal and proximal calf veins. Spectral Doppler was utilized to
evaluate flow at rest and with distal augmentation maneuvers.

[Series 1: us extrem low venous*right* · 14 of 28 slices shown]
[im 1/28]
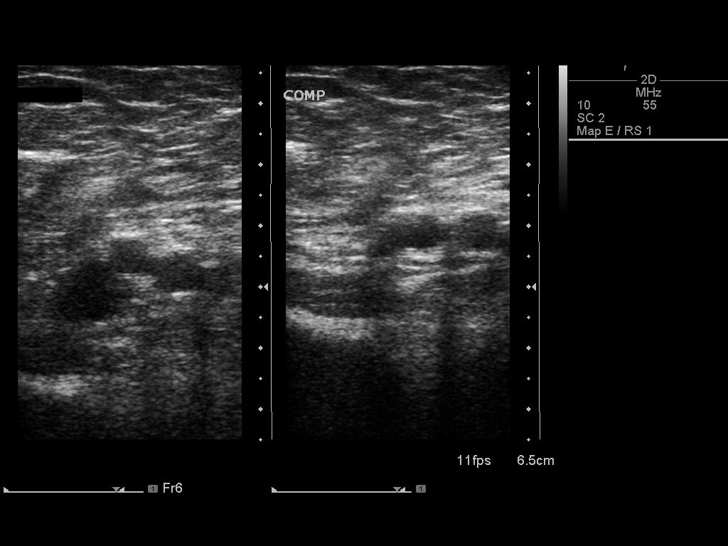
[im 3/28]
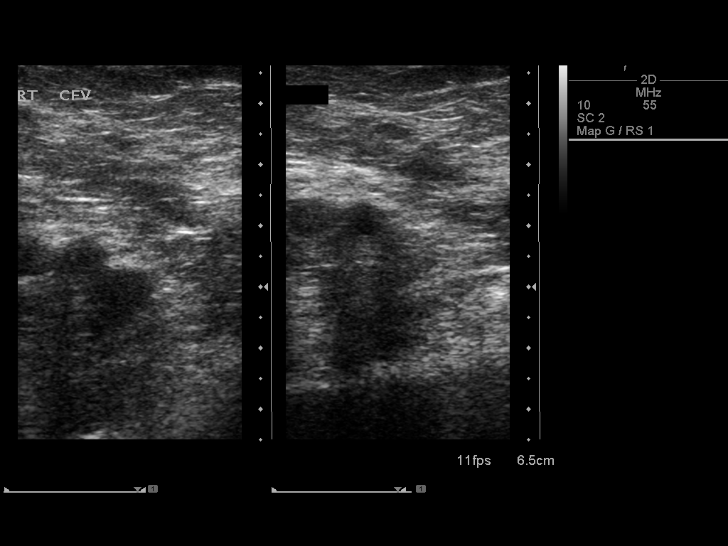
[im 5/28]
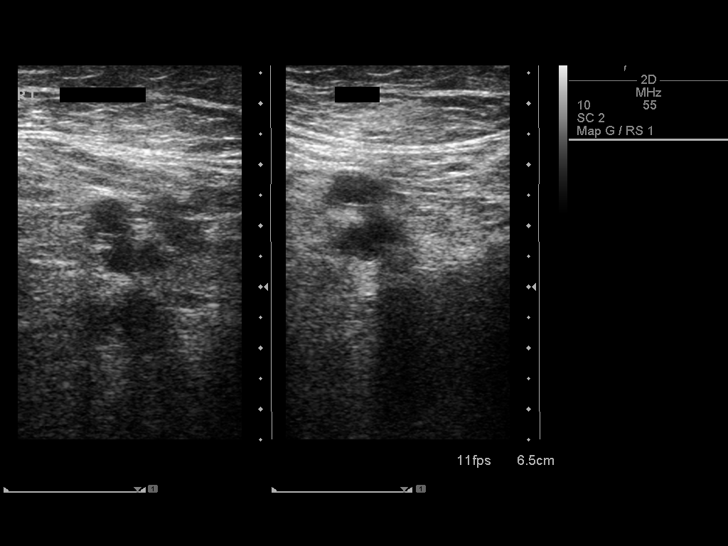
[im 8/28]
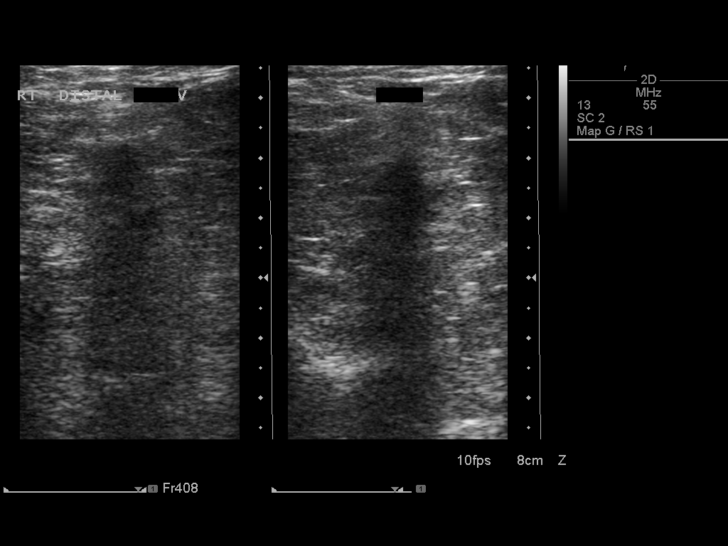
[im 9/28]
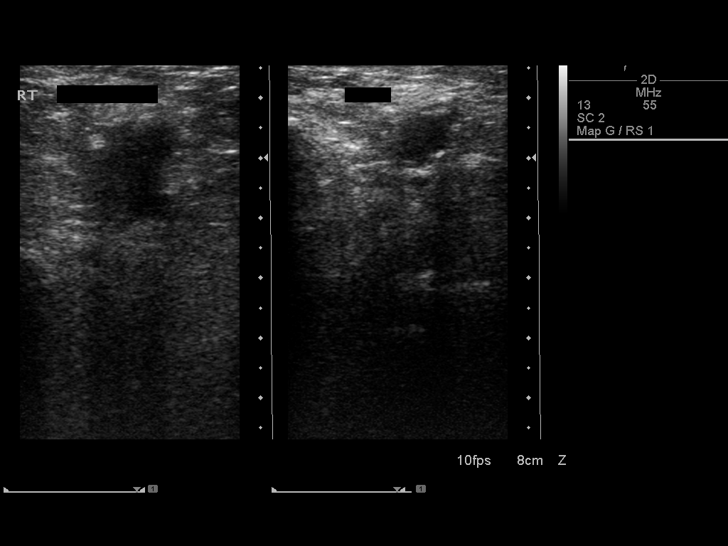
[im 11/28]
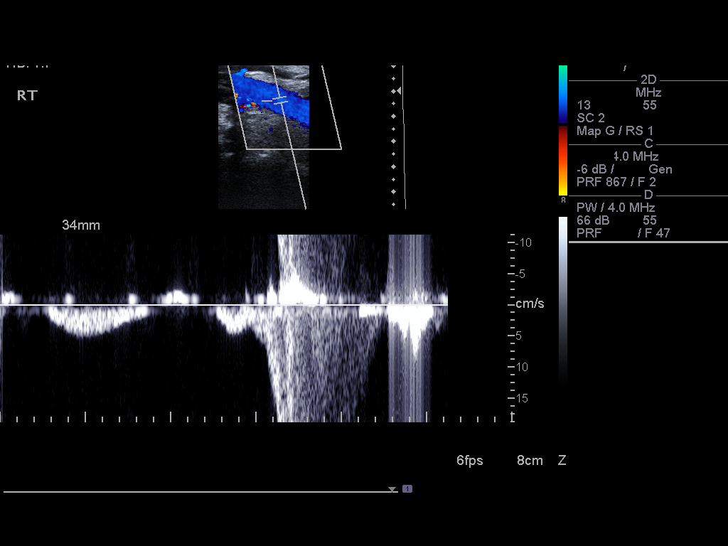
[im 13/28]
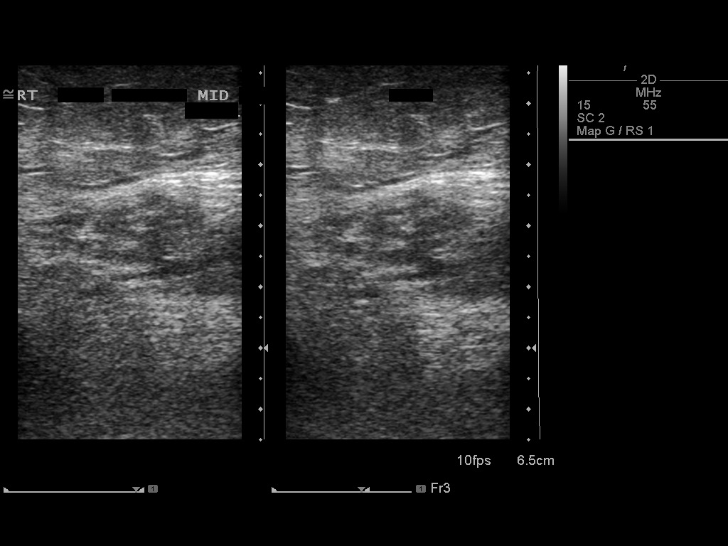
[im 15/28]
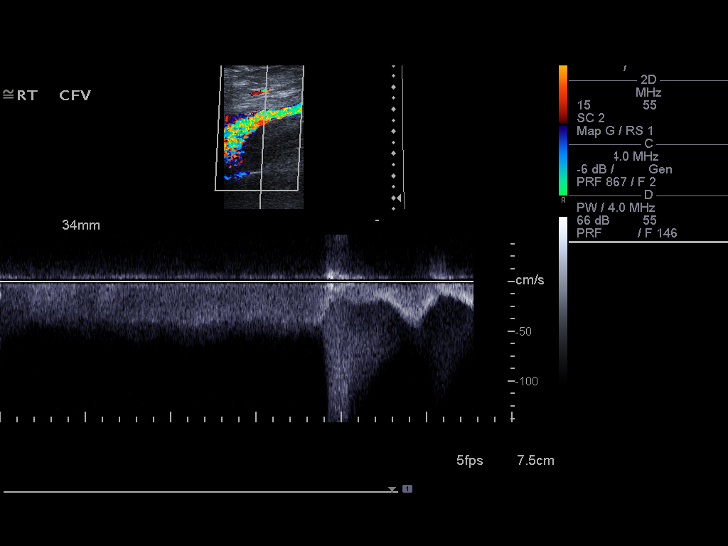
[im 17/28]
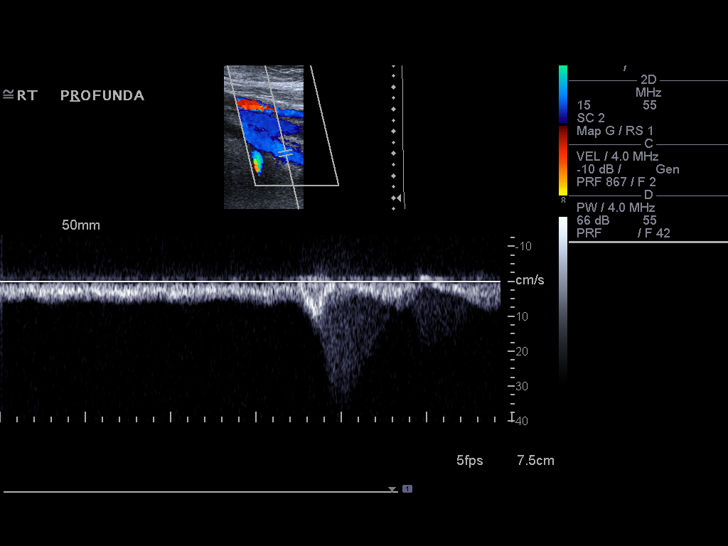
[im 19/28]
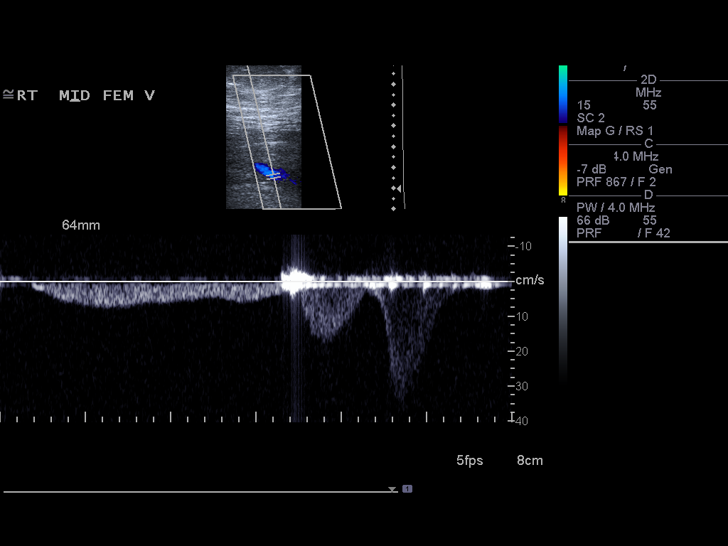
[im 22/28]
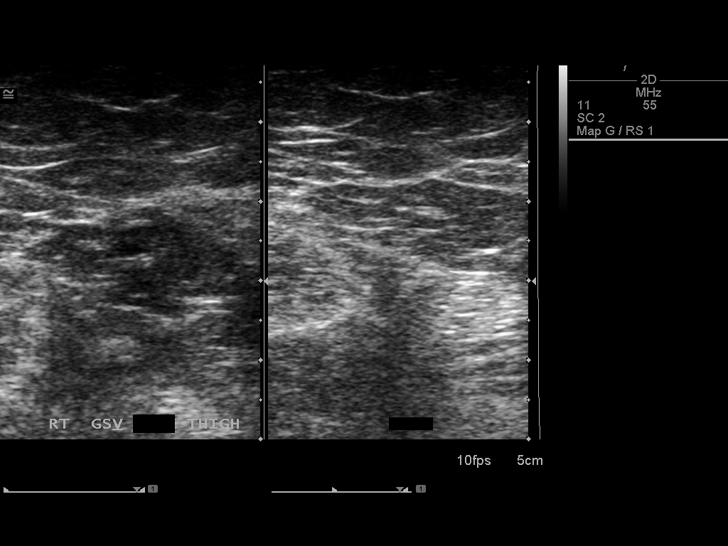
[im 23/28]
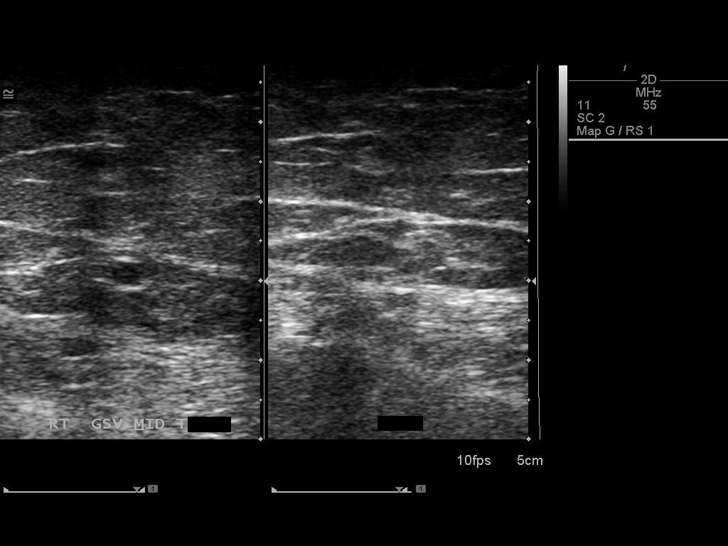
[im 25/28]
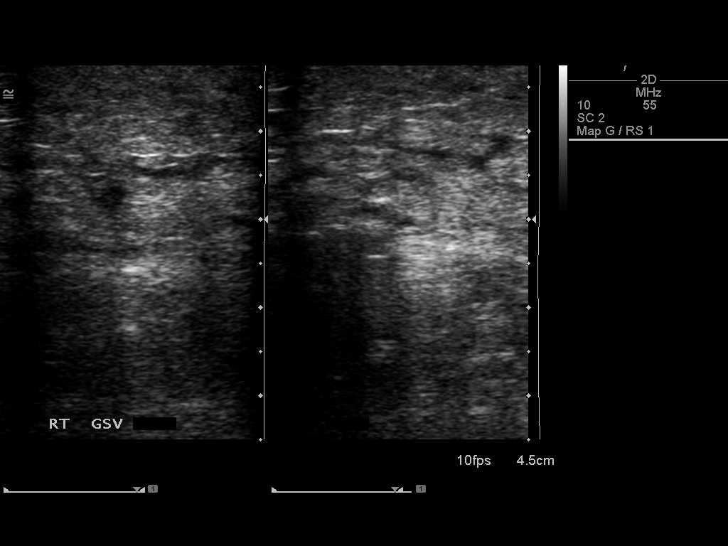
[im 28/28]
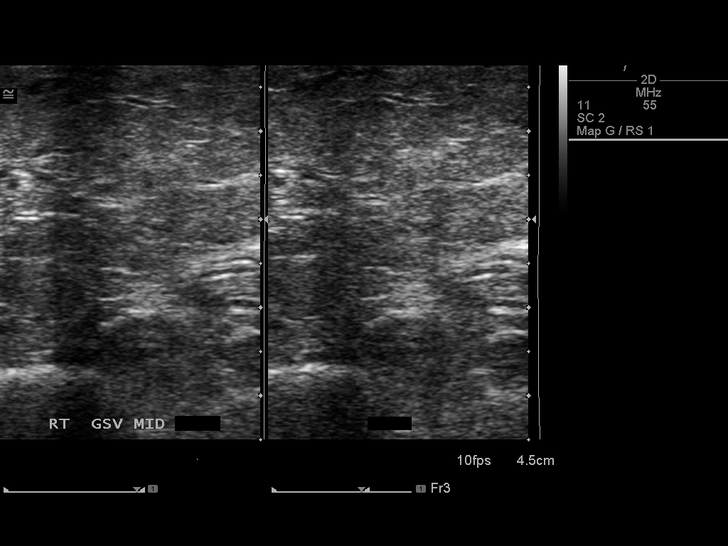

[14 of 24 positions shown; findings below may reference images not displayed]

FINDINGS: Left common femoral vein is patent without thrombus.

Normal compressibility, augmentation and color Doppler flow in the
right common femoral vein, right femoral vein and right popliteal
vein. Right profunda femoral vein is patent without thrombus. The
right saphenofemoral junction is patent. Visualized right deep calf
veins are patent without thrombus. Right great saphenous vein is
patent.
IMPRESSION: Negative for deep venous thrombosis in right lower extremity.

## 2017-09-09 DIAGNOSIS — I10 Essential (primary) hypertension: Secondary | ICD-10-CM | POA: Diagnosis not present

## 2017-09-09 DIAGNOSIS — J45909 Unspecified asthma, uncomplicated: Secondary | ICD-10-CM | POA: Diagnosis not present

## 2017-09-09 DIAGNOSIS — E785 Hyperlipidemia, unspecified: Secondary | ICD-10-CM | POA: Diagnosis not present

## 2017-09-09 DIAGNOSIS — E669 Obesity, unspecified: Secondary | ICD-10-CM | POA: Diagnosis not present

## 2017-09-09 DIAGNOSIS — R21 Rash and other nonspecific skin eruption: Secondary | ICD-10-CM | POA: Diagnosis not present

## 2017-09-09 DIAGNOSIS — Z23 Encounter for immunization: Secondary | ICD-10-CM | POA: Diagnosis not present

## 2017-09-10 DIAGNOSIS — B079 Viral wart, unspecified: Secondary | ICD-10-CM | POA: Diagnosis not present

## 2017-09-10 DIAGNOSIS — M79672 Pain in left foot: Secondary | ICD-10-CM | POA: Diagnosis not present

## 2017-09-20 ENCOUNTER — Ambulatory Visit: Payer: Medicare Other | Admitting: Sports Medicine

## 2017-09-24 ENCOUNTER — Ambulatory Visit: Payer: Self-pay

## 2017-09-24 ENCOUNTER — Ambulatory Visit: Payer: Medicare Other | Admitting: Sports Medicine

## 2017-09-24 ENCOUNTER — Encounter: Payer: Self-pay | Admitting: Sports Medicine

## 2017-09-24 ENCOUNTER — Ambulatory Visit (INDEPENDENT_AMBULATORY_CARE_PROVIDER_SITE_OTHER): Payer: Medicare Other | Admitting: Sports Medicine

## 2017-09-24 VITALS — BP 120/76 | HR 72 | Ht 61.5 in | Wt 218.8 lb

## 2017-09-24 DIAGNOSIS — M170 Bilateral primary osteoarthritis of knee: Secondary | ICD-10-CM

## 2017-09-24 NOTE — Patient Instructions (Signed)

## 2017-09-24 NOTE — Procedures (Signed)
PROCEDURE NOTE:  Ultrasound Guided: Aspiration and Injection: Bilateral knee, Orthovisc injection Images were obtained and interpreted by myself, Teresa Coombs, DO  Images have been saved and stored to PACS system. Images obtained on: GE S7 Ultrasound machine    ULTRASOUND FINDINGS:  Large effusions bilaterally.  DESCRIPTION OF PROCEDURE:  The patient's clinical condition is marked by substantial pain and/or significant functional disability. Other conservative therapy has not provided relief, is contraindicated, or not appropriate. There is a reasonable likelihood that injection will significantly improve the patient's pain and/or functional impairment.   After discussing the risks, benefits and expected outcomes of the injection and all questions were reviewed and answered, the patient wished to undergo the above named procedure.  Verbal consent was obtained.  The ultrasound was used to identify the target structure and adjacent neurovascular structures. The skin was then prepped in sterile fashion and the target structure was injected under direct visualization using sterile technique as below:  Bilateral injections performed under identical technique as below: PREP: Alcohol, Ethel Chloride and 5 cc 1% lidocaine on 25g 1.5 in. needle APPROACH:superiolateral, stopcock technique, 18g 1.5 in. INJECTATE: 2cc OrthoVisc ASPIRATE: 50 cc of serous fluid out of the right knee and 70 cc of serous fluid out of the left knee.  DRESSING: Band-Aid and 6-inch Ace Wrap  Post procedural instructions including recommending icing and warning signs for infection were reviewed.    This procedure was well tolerated and there were no complications.   IMPRESSION: Succesful Ultrasound Guided: Aspiration and Injection

## 2017-09-24 NOTE — Progress Notes (Signed)
[name], [place] at [place]  [name] - 72 y.o. female MRN [id number]  Date of birth: [birth year]  Visit Date: 09/24/2017  PCP: Krista Shanks, MD   Referred by: Krista Shanks, MD  Scribe for today's visit: [name], CMA     SUBJECTIVE:  [name] "[name]" is here for Follow-up (B knee pain)  HPI: 10/31/15: Ambulates with a cane. Patient states both knees are stiff. No swelling.  States she had a Right  Hip replacement for 4weeks ago with Dr. Ninfa [name].  Here for Bilateral Injections.   05/04/16: [name] is here today for her 2nd [place]. She tolerated 1st series well with much improvement.  11/04/16: [name] is an established presenting today for a 6 month f/u of her B knee pain / OA.  Pt's last visit w/ Dr. Paulla [name] was on 05/04/2016.  Pt states that her knees have been doing very well.  She notes that they are stiff.  She states that she wears the Body Helix sleeves when she needs them.  She reports having some pain but notes that the pain is manageable.  She feels like her knees are definitely better today compared to how they've felt at prior visits.  She also feels like her knees are also better after having R hip replaced in Sept. 2017.  11/20/16: [name] is an established pt presenting today for f/u of her B knee pain.  She was last seen on 11/04/16 and had injections into both knees.  Pt states that her knees felt terrible after her last set of injections.  She states that she iced her knees and reports that they slowly started to feel better and that it took about 8 days for her to calm down.  She rates her pain as a 2/10 today.  11/30/16: Compared to the last office visit on 11/20/16, her previously described symptoms are improving.  She states that she has not had any issues since her last injection. Current symptoms are resolved. She has been receiving  Orthovisc injections and was previously prescribed topical Pennsaid.   03/09/17: Compared to the last office visit, her previously described symptoms are improving. She did notice some pain in her knees while traveling but aside from that she reports that she has been doing very well.  Current symptoms are mild & are non-radiating. She denies swelling around either knee.  She had Orthovisc injection 11/30/16 and has been doing very well since then. She has not needed to use oral or topical medication for pain recently.  She does report numbness on the lateral aspect of the R leg which started after hip surgery. This does seem to be improving over time.   06/07/2017: Compared to the last office visit, her previously described symptoms are improving, she is able to go up stairs  Current symptoms are mild & are nonradiating She is not currently taking any medications for her knee pain.   09/24/2017: Compared to the last office visit, her previously described symptoms are worsening. She reports good relief after last viscosupplementation 11/2017. Sx started to flare up about 1-1.5 mos ago. She notes that she has put on some extra weight recently. She has noticed swelling around her knees and ankles. She feels like the swelling is less than it was before. She has some pain when going down stairs.  Current symptoms are mild & are nonradiating She has been using  Voltaren prn with some relief.    ROS Denies night time disturbances. Denies fevers, chills, or night sweats. Denies unexplained weight loss. Denies personal history of cancer. Denies changes in bowel or bladder habits. Denies recent unreported falls. Denies new or worsening dyspnea or wheezing. Denies headaches or dizziness.  Denies numbness, tingling, weakness.  Denies dizziness or presyncopal episodes Reports lower extremity edema    HISTORY:  Prior history reviewed and updated per electronic medical record.  Social History    Occupational History  . Not on file  Tobacco Use  . Smoking status: Former Smoker    Types: Cigarettes    Last attempt to quit: 01/05/1978    Years since quitting: 39.7  . Smokeless tobacco: Never Used  Substance and Sexual Activity  . Alcohol use: No  . Drug use: No  . Sexual activity: Not on file   Social History   Social History Narrative  . Not on file    DATA OBTAINED & REVIEWED:  No results for input(s): HGBA1C, LABURIC, CREATINE in the last 8760 hours. . Orthovisc series started in October 2018, did very well.  Persistent effusions. . Patient with marked degenerative changes on MSK ultrasound of bilateral knees with tricompartmental changes and large effusions. .   OBJECTIVE:  VS:  HT:5' 1.5" (156.2 cm)   WT:218 lb 12.8 oz (99.2 kg)  BMI:40.68    BP:120/76  HR:72bpm  TEMP: ( )  RESP:94 %   PHYSICAL EXAM: CONSTITUTIONAL: Well-developed, Well-nourished and In no acute distress PSYCHIATRIC: Alert & appropriately interactive. and Not depressed or anxious appearing. RESPIRATORY: No increased work of breathing and Trachea Midline EYES: Pupils are equal., EOM intact without nystagmus. and No scleral icterus.  VASCULAR EXAM: Warm and well perfused NEURO: unremarkable  MSK Exam: Bilateral knees  Generalized osteophytic bossing bilaterally No overlying skin changes. No focal bony tenderness   RANGE OF MOTION & STRENGTH  Limited extension bilaterally, left worse than right.   SPECIALITY TESTING:  Moderate effusions bilaterally with generalized synovitis present.  Ligamentously stable to varus and valgus strain.  Slight valgus deformity  Pain with patellar grind and McMurray's    ASSESSMENT   1. Primary osteoarthritis of both knees     PLAN:  Pertinent additional documentation may be included in corresponding procedure notes, imaging studies, problem based documentation and patient instructions.  Procedures:  . US Guided Injection per procedure  note  Medications:  No orders of the defined types were placed in this encounter.  Discussion/Instructions: No problem-specific Assessment & Plan notes found for this encounter.  . The decision today was based on physical exam and had good response to Orthovisc in the past.  She has radiographic evidence of osteoarthritic changes and will benefit from Visco supplementation.  . Orthovisc series started today injection #1 of 3 . Continue with compression . Discussed red flag symptoms that warrant earlier emergent evaluation and patient voices understanding. . Activity modifications and the importance of avoiding exacerbating activities (limiting pain to no more than a 4 / 10 during or following activity) recommended and discussed.  Follow-up:  . Return in about 1 week (around 10/01/2017) for repeat orthovisc injections.   . If any lack of improvement consider: referral to Orthopedics for Total knee arthroplasties.  . At follow up will plan to consider: Orthovisc No. 2 of 3     CMA/ATC served as scribe during this visit. History, Physical, and Plan performed by medical provider. Documentation and orders reviewed and attested to.  Gerda Diss, [place] [facility] Physician

## 2017-09-26 ENCOUNTER — Encounter: Payer: Self-pay | Admitting: Sports Medicine

## 2017-10-04 ENCOUNTER — Ambulatory Visit: Payer: Medicare Other | Admitting: Sports Medicine

## 2017-10-05 ENCOUNTER — Ambulatory Visit (INDEPENDENT_AMBULATORY_CARE_PROVIDER_SITE_OTHER): Payer: Medicare Other | Admitting: Sports Medicine

## 2017-10-05 ENCOUNTER — Encounter: Payer: Self-pay | Admitting: Sports Medicine

## 2017-10-05 ENCOUNTER — Ambulatory Visit: Payer: Self-pay

## 2017-10-05 VITALS — BP 122/76 | HR 73 | Ht 61.5 in | Wt 218.6 lb

## 2017-10-05 DIAGNOSIS — M170 Bilateral primary osteoarthritis of knee: Secondary | ICD-10-CM

## 2017-10-05 NOTE — Progress Notes (Signed)
[name]. [name], [facility]  [name] - 72 y.o. female MRN [id number]  Date of birth: 1945/03/28  Visit Date: 10/05/2017  PCP: Vernie Shanks, MD   Referred by: Vernie Shanks, MD  Scribe for today's visit: Wendy Poet, LAT, ATC     SUBJECTIVE:  Krista Corporal "[name]" is here for Follow-up (B knee pain)  HPI: 10/31/15: Ambulates with a cane. Patient states both knees are stiff. No swelling.  States she had a Right  Hip replacement for 4weeks ago with Dr. Ninfa Linden.  Here for Bilateral Injections.   05/04/16: [name] is here today for her 2nd [place]. She tolerated 1st series well with much improvement.  11/04/16: [name] is an established presenting today for a 6 month f/u of her B knee pain / OA.  Pt's last visit w/ Dr. Paulla Fore was on 05/04/2016.  Pt states that her knees have been doing very well.  She notes that they are stiff.  She states that she wears the Body Helix sleeves when she needs them.  She reports having some pain but notes that the pain is manageable.  She feels like her knees are definitely better today compared to how they've felt at prior visits.  She also feels like her knees are also better after having R hip replaced in Sept. 2017.  11/20/16: [name] is an established pt presenting today for f/u of her B knee pain.  She was last seen on 11/04/16 and had injections into both knees.  Pt states that her knees felt terrible after her last set of injections.  She states that she iced her knees and reports that they slowly started to feel better and that it took about 8 days for her to calm down.  She rates her pain as a 2/10 today.  11/30/16: Compared to the last office visit on 11/20/16, her previously described symptoms are improving.  She states that she has not had any issues since her last injection. Current symptoms are resolved. She has been receiving  Orthovisc injections and was previously prescribed topical Pennsaid.   03/09/17: Compared to the last office visit, her previously described symptoms are improving. She did notice some pain in her knees while traveling but aside from that she reports that she has been doing very well.  Current symptoms are mild & are non-radiating. She denies swelling around either knee.  She had Orthovisc injection 11/30/16 and has been doing very well since then. She has not needed to use oral or topical medication for pain recently.  She does report numbness on the lateral aspect of the R leg which started after hip surgery. This does seem to be improving over time.   06/07/2017: Compared to the last office visit, her previously described symptoms are improving, she is able to go up stairs  Current symptoms are mild & are nonradiating She is not currently taking any medications for her knee pain.   09/24/2017: Compared to the last office visit, her previously described symptoms are worsening. She reports good relief after last viscosupplementation 11/2017. Sx started to flare up about 1-1.5 mos ago. She notes that she has put on some extra weight recently. She has noticed swelling around her knees and ankles. She feels like the swelling is less than it was before. She has some pain when going down stairs.  Current symptoms are mild & are nonradiating She has been  using Voltaren prn with some relief.   10/05/2017: Compared to the last office visit on 09/24/17, her previously described B knee pain symptoms are improving w/ noted 50% improvement. Current symptoms are mild & are nonradiating She has been using Voltaren prn.  She had her first Orthovisc injection at her last visit on 09/24/17.  ROS Denies night time disturbances. Denies fevers, chills, or night sweats. Denies unexplained weight loss. Denies personal history of cancer. Denies changes in bowel or bladder habits. Denies recent unreported  falls. Denies new or worsening dyspnea or wheezing. Denies headaches or dizziness.  Denies numbness, tingling, weakness.  Denies dizziness or presyncopal episodes Reports lower extremity edema    HISTORY:  Prior history reviewed and updated per electronic medical record.  Social History   Occupational History  . Not on file  Tobacco Use  . Smoking status: Former Smoker    Types: Cigarettes    Last attempt to quit: 01/05/1978    Years since quitting: 39.7  . Smokeless tobacco: Never Used  Substance and Sexual Activity  . Alcohol use: No  . Drug use: No  . Sexual activity: Not on file   Social History   Social History Narrative  . Not on file    DATA OBTAINED & REVIEWED:  No results for input(s): HGBA1C, LABURIC, CREATINE in the last 8760 hours. . Orthovisc series started in October 2018, did very well.  Persistent effusions. . Patient with marked degenerative changes on MSK ultrasound of bilateral knees with tricompartmental changes and large effusions. .   OBJECTIVE:  VS:  HT:5' 1.5" (156.2 cm)   WT:218 lb 9.6 oz (99.2 kg)  BMI:40.64    BP:122/76  HR:73bpm  TEMP: ( )  RESP:94 %   PHYSICAL EXAM: CONSTITUTIONAL: Well-developed, Well-nourished and In no acute distress PSYCHIATRIC: Alert & appropriately interactive. and Not depressed or anxious appearing. RESPIRATORY: No increased work of breathing and Trachea Midline EYES: Pupils are equal., EOM intact without nystagmus. and No scleral icterus.  VASCULAR EXAM: Warm and well perfused NEURO: unremarkable  MSK Exam: Bilateral knees  Generalized osteophytic bossing bilaterally No overlying skin changes. No focal bony tenderness   RANGE OF MOTION & STRENGTH  Limited extension bilaterally, left worse than right.   SPECIALITY TESTING:  Moderate effusions bilaterally with generalized synovitis present.  Ligamentously stable to varus and valgus strain.  Slight valgus deformity  Pain with patellar grind and  McMurray's    ASSESSMENT   1. Primary osteoarthritis of both knees     PLAN:  Pertinent additional documentation may be included in corresponding procedure notes, imaging studies, problem based documentation and patient instructions.  Procedures:  . US Guided Injection per procedure note  Medications:  No orders of the defined types were placed in this encounter.  Discussion/Instructions: No problem-specific Assessment & Plan notes found for this encounter.  . The decision today was based on physical exam and had good response to Orthovisc in the past.  She has radiographic evidence of osteoarthritic changes and will benefit from Visco supplementation.  . Orthovisc series started today injection #2 of 3 . Continue with compression . Discussed red flag symptoms that warrant earlier emergent evaluation and patient voices understanding. . Activity modifications and the importance of avoiding exacerbating activities (limiting pain to no more than a 4 / 10 during or following activity) recommended and discussed.  Follow-up:  . No follow-ups on file.   . If any lack of improvement consider: referral to Orthopedics for Total knee arthroplasties.  Marland Kitchen  At follow up will plan to consider: Orthovisc No. 3 of 3     CMA/ATC served as scribe during this visit. History, Physical, and Plan performed by medical provider. Documentation and orders reviewed and attested to.       Gerda Diss, [facility] Physician

## 2017-10-05 NOTE — Patient Instructions (Signed)

## 2017-10-05 NOTE — Procedures (Signed)
PROCEDURE NOTE:  Ultrasound Guided: Aspiration and Injection: Bilateral knee, Orthovisc injection Images were obtained and interpreted by myself, Teresa Coombs, DO  Images have been saved and stored to PACS system. Images obtained on: GE S7 Ultrasound machine    ULTRASOUND FINDINGS:  Large effusions bilaterally.  DESCRIPTION OF PROCEDURE:  The patient's clinical condition is marked by substantial pain and/or significant functional disability. Other conservative therapy has not provided relief, is contraindicated, or not appropriate. There is a reasonable likelihood that injection will significantly improve the patient's pain and/or functional impairment.   After discussing the risks, benefits and expected outcomes of the injection and all questions were reviewed and answered, the patient wished to undergo the above named procedure.  Verbal consent was obtained.  The ultrasound was used to identify the target structure and adjacent neurovascular structures. The skin was then prepped in sterile fashion and the target structure was injected under direct visualization using sterile technique as below:  Bilateral injections performed under identical technique as below: PREP: Alcohol, Ethel Chloride and 5 cc 1% lidocaine on 25g 1.5 in. needle APPROACH:superiolateral, stopcock technique, 18g 1.5 in. INJECTATE: 2cc OrthoVisc ASPIRATE: 45cc of serous fluid out of the right knee and 70 cc of serous fluid out of the left knee.  DRESSING: Band-Aid and Patients home compression device  Post procedural instructions including recommending icing and warning signs for infection were reviewed.    This procedure was well tolerated and there were no complications.   IMPRESSION: Succesful Ultrasound Guided: Aspiration and Injection

## 2017-10-08 DIAGNOSIS — J0100 Acute maxillary sinusitis, unspecified: Secondary | ICD-10-CM | POA: Diagnosis not present

## 2017-10-08 DIAGNOSIS — R05 Cough: Secondary | ICD-10-CM | POA: Diagnosis not present

## 2017-10-21 ENCOUNTER — Ambulatory Visit (INDEPENDENT_AMBULATORY_CARE_PROVIDER_SITE_OTHER): Payer: Medicare Other | Admitting: Sports Medicine

## 2017-10-21 ENCOUNTER — Encounter: Payer: Self-pay | Admitting: Sports Medicine

## 2017-10-21 ENCOUNTER — Ambulatory Visit: Payer: Self-pay

## 2017-10-21 VITALS — BP 130/78 | HR 66 | Ht 61.5 in | Wt 219.0 lb

## 2017-10-21 DIAGNOSIS — M25461 Effusion, right knee: Secondary | ICD-10-CM

## 2017-10-21 DIAGNOSIS — M25462 Effusion, left knee: Secondary | ICD-10-CM

## 2017-10-21 DIAGNOSIS — M170 Bilateral primary osteoarthritis of knee: Secondary | ICD-10-CM

## 2017-10-21 NOTE — Progress Notes (Signed)
[name]. Lashia Niese, [facility]  DALILA ARCA - 72 y.o. female MRN [id number]  Date of birth: 1945/11/09  Visit Date: 10/21/2017  PCP: Vernie Shanks, MD   Referred by: Vernie Shanks, MD  Scribe for today's visit: Wendy Poet, LAT, ATC     SUBJECTIVE:  [name] is here for Follow-up (B knee pain)  HPI: 10/31/15: Ambulates with a cane. Patient states both knees are stiff. No swelling.  States she had a Right  Hip replacement for 4weeks ago with Dr. Ninfa Linden.  Here for Bilateral Injections.   05/04/16: Autry is here today for her 2nd [place]. She tolerated 1st series well with much improvement.  11/04/16: Ms. Alatorre is an established presenting today for a 6 month f/u of her B knee pain / OA.  Pt's last visit w/ Dr. Paulla Fore was on 05/04/2016.  Pt states that her knees have been doing very well.  She notes that they are stiff.  She states that she wears the Body Helix sleeves when she needs them.  She reports having some pain but notes that the pain is manageable.  She feels like her knees are definitely better today compared to how they've felt at prior visits.  She also feels like her knees are also better after having R hip replaced in Sept. 2017.  11/20/16: Memory is an established pt presenting today for f/u of her B knee pain.  She was last seen on 11/04/16 and had injections into both knees.  Pt states that her knees felt terrible after her last set of injections.  She states that she iced her knees and reports that they slowly started to feel better and that it took about 8 days for her to calm down.  She rates her pain as a 2/10 today.  11/30/16: Compared to the last office visit on 11/20/16, her previously described symptoms are improving.  She states that she has not had any issues since her last injection. Current symptoms are resolved. She has been receiving  Orthovisc injections and was previously prescribed topical Pennsaid.   03/09/17: Compared to the last office visit, her previously described symptoms are improving. She did notice some pain in her knees while traveling but aside from that she reports that she has been doing very well.  Current symptoms are mild & are non-radiating. She denies swelling around either knee.  She had Orthovisc injection 11/30/16 and has been doing very well since then. She has not needed to use oral or topical medication for pain recently.  She does report numbness on the lateral aspect of the R leg which started after hip surgery. This does seem to be improving over time.   06/07/2017: Compared to the last office visit, her previously described symptoms are improving, she is able to go up stairs  Current symptoms are mild & are nonradiating She is not currently taking any medications for her knee pain.   09/24/2017: Compared to the last office visit, her previously described symptoms are worsening. She reports good relief after last viscosupplementation 11/2017. Sx started to flare up about 1-1.5 mos ago. She notes that she has put on some extra weight recently. She has noticed swelling around her knees and ankles. She feels like the swelling is less than it was before. She has some pain when going down stairs.  Current symptoms are mild & are nonradiating She has been  using Voltaren prn with some relief.   10/05/2017: Compared to the last office visit on 09/24/17, her previously described B knee pain symptoms are improving w/ noted 50% improvement. Current symptoms are mild & are nonradiating She has been using Voltaren prn.  She had her first Orthovisc injection at her last visit on 09/24/17.  10/21/2017: Compared to the last office visit on 10/05/17, her previously described B knee symptoms are improving.  She states that she had a lot of increased pain at the injection site and swelling which lasted for days.  She  notes that she didn't start feeling better until this past Sunday. Current symptoms are mild & are nonradiating She has been using Voltaren prn.  She had her first two Orthovisc injections on 09/24/17 and 10/05/17.  ROS Denies night time disturbances. Denies fevers, chills, or night sweats. Denies unexplained weight loss. Denies personal history of cancer. Denies changes in bowel or bladder habits. Denies recent unreported falls. Denies new or worsening dyspnea or wheezing. Denies headaches or dizziness.  Denies numbness, tingling, weakness.  Denies dizziness or presyncopal episodes Reports lower extremity edema    HISTORY:  Prior history reviewed and updated per electronic medical record.  Social History   Occupational History  . Not on file  Tobacco Use  . Smoking status: Former Smoker    Types: Cigarettes    Last attempt to quit: 01/05/1978    Years since quitting: 39.8  . Smokeless tobacco: Never Used  Substance and Sexual Activity  . Alcohol use: No  . Drug use: No  . Sexual activity: Not on file   Social History   Social History Narrative  . Not on file    DATA OBTAINED & REVIEWED:  No results for input(s): HGBA1C, LABURIC, CREATINE in the last 8760 hours. . Orthovisc series started in October 2018, did very well.  Persistent effusions. . Patient with marked degenerative changes on MSK ultrasound of bilateral knees with tricompartmental changes and large effusions.  OBJECTIVE:  VS:  HT:5' 1.5" (156.2 cm)   WT:219 lb (99.3 kg)  BMI:40.71    BP:130/78  HR:66bpm  TEMP: ( )  RESP:97 %   PHYSICAL EXAM: CONSTITUTIONAL: Well-developed, Well-nourished and In no acute distress PSYCHIATRIC: Alert & appropriately interactive. and Not depressed or anxious appearing. RESPIRATORY: No increased work of breathing and Trachea Midline EYES: Pupils are equal., EOM intact without nystagmus. and No scleral icterus.  VASCULAR EXAM: Warm and well perfused NEURO:  unremarkable  MSK Exam: Bilateral knees  Generalized osteophytic bossing bilaterally No overlying skin changes. No focal bony tenderness   RANGE OF MOTION & STRENGTH  Limited extension bilaterally, left worse than right.   SPECIALITY TESTING:  Moderate effusions bilaterally with generalized synovitis present.  Ligamentously stable to varus and valgus strain.  Slight valgus deformity  Pain with patellar grind and McMurray's    ASSESSMENT   1. Primary osteoarthritis of both knees   2. Effusion of both knee joints     PLAN:  Pertinent additional documentation may be included in corresponding procedure notes, imaging studies, problem based documentation and patient instructions.  Procedures:  . US Guided Injection per procedure note  Medications:  No orders of the defined types were placed in this encounter.  Discussion/Instructions: No problem-specific Assessment & Plan notes found for this encounter.  . The decision today was based on physical exam and had good response to Orthovisc in the past.  She has radiographic evidence of osteoarthritic changes and will benefit from [facility]  supplementation.  . Orthovisc series started today injection #3 of 3 . Continue with compression . Discussed red flag symptoms that warrant earlier emergent evaluation and patient voices understanding. . Activity modifications and the importance of avoiding exacerbating activities (limiting pain to no more than a 4 / 10 during or following activity) recommended and discussed.  Follow-up:  . Return in about 6 months (around 04/22/2018) for B knee Monovisc injections.   . If any lack of improvement consider: referral to Orthopedics for Total knee arthroplasties.  . At follow up will plan to consider: Monovisc injections at next series given that she has some discomfort with repeat aspirations.     CMA/ATC served as Education administrator during this visit. History, Physical, and Plan performed by medical  provider. Documentation and orders reviewed and attested to.       Gerda Diss, [facility] Physician

## 2017-10-21 NOTE — Patient Instructions (Addendum)

## 2017-10-21 NOTE — Procedures (Signed)
PROCEDURE NOTE:  Ultrasound Guided: Aspiration and Injection: Bilateral knee, Orthovisc injection Images were obtained and interpreted by myself, Teresa Coombs, DO  Images have been saved and stored to PACS system. Images obtained on: GE S7 Ultrasound machine    ULTRASOUND FINDINGS:  Large effusions with tricompartmental degenerative changes and moderate synovitis.  DESCRIPTION OF PROCEDURE:  The patient's clinical condition is marked by substantial pain and/or significant functional disability. Other conservative therapy has not provided relief, is contraindicated, or not appropriate. There is a reasonable likelihood that injection will significantly improve the patient's pain and/or functional impairment.   After discussing the risks, benefits and expected outcomes of the injection and all questions were reviewed and answered, the patient wished to undergo the above named procedure.  Verbal consent was obtained.  The ultrasound was used to identify the target structure and adjacent neurovascular structures. The skin was then prepped in sterile fashion and the target structure was injected under direct visualization using sterile technique as below:  Bilateral injections performed under identical technique as below: PREP: Alcohol, Ethel Chloride and 5 cc 1% lidocaine on 25g 1.5 in. needle APPROACH:superiolateral, stopcock technique, 18g 1.5 in. INJECTATE: 2cc OrthoVisc ASPIRATE: Right knee 45 cc of serous, thin fluid.  Left knee 55 cc of serous, thin fluid.  DRESSING: Band-Aid and Patients home compression device  Post procedural instructions including recommending icing and warning signs for infection were reviewed.    This procedure was well tolerated and there were no complications.   IMPRESSION: Succesful Ultrasound Guided: Aspiration and Injection

## 2017-12-09 DIAGNOSIS — I10 Essential (primary) hypertension: Secondary | ICD-10-CM | POA: Diagnosis not present

## 2017-12-09 DIAGNOSIS — E669 Obesity, unspecified: Secondary | ICD-10-CM | POA: Diagnosis not present

## 2017-12-09 DIAGNOSIS — E785 Hyperlipidemia, unspecified: Secondary | ICD-10-CM | POA: Diagnosis not present

## 2017-12-09 DIAGNOSIS — J45909 Unspecified asthma, uncomplicated: Secondary | ICD-10-CM | POA: Diagnosis not present

## 2017-12-10 DIAGNOSIS — I739 Peripheral vascular disease, unspecified: Secondary | ICD-10-CM | POA: Diagnosis not present

## 2017-12-10 DIAGNOSIS — L84 Corns and callosities: Secondary | ICD-10-CM | POA: Diagnosis not present

## 2017-12-10 DIAGNOSIS — L603 Nail dystrophy: Secondary | ICD-10-CM | POA: Diagnosis not present

## 2018-03-18 DIAGNOSIS — L603 Nail dystrophy: Secondary | ICD-10-CM | POA: Diagnosis not present

## 2018-03-18 DIAGNOSIS — L84 Corns and callosities: Secondary | ICD-10-CM | POA: Diagnosis not present

## 2018-03-18 DIAGNOSIS — I739 Peripheral vascular disease, unspecified: Secondary | ICD-10-CM | POA: Diagnosis not present

## 2018-04-28 ENCOUNTER — Ambulatory Visit: Payer: Medicare Other | Admitting: Sports Medicine

## 2018-09-02 DIAGNOSIS — J45909 Unspecified asthma, uncomplicated: Secondary | ICD-10-CM | POA: Diagnosis not present

## 2018-09-02 DIAGNOSIS — I10 Essential (primary) hypertension: Secondary | ICD-10-CM | POA: Diagnosis not present

## 2018-09-02 DIAGNOSIS — E785 Hyperlipidemia, unspecified: Secondary | ICD-10-CM | POA: Diagnosis not present

## 2018-09-02 DIAGNOSIS — E669 Obesity, unspecified: Secondary | ICD-10-CM | POA: Diagnosis not present

## 2018-09-06 ENCOUNTER — Ambulatory Visit: Payer: Medicare Other | Admitting: Sports Medicine

## 2018-09-20 ENCOUNTER — Telehealth: Payer: Self-pay | Admitting: Orthopaedic Surgery

## 2018-09-20 NOTE — Telephone Encounter (Signed)
Pt called in said she usually see's Dr.Rigby for gel injections but she was told he was no longer [place] so she is looking to get the Visco Gel injection (3 series) for bilateral knee's.  She last seen dr.blackman 12-11-2015  Please give her a call   [phone]

## 2018-09-20 NOTE — Telephone Encounter (Signed)
You think it would be ok to just order some Euflexxa for her, or do you think we would need to see her again first?

## 2018-09-20 NOTE — Telephone Encounter (Signed)
I'm fine with ordering Euflexa for her.

## 2018-09-20 NOTE — Telephone Encounter (Signed)
Can we order euflexxa for her please

## 2018-09-21 NOTE — Telephone Encounter (Signed)
Noted  

## 2018-09-23 ENCOUNTER — Telehealth: Payer: Self-pay

## 2018-09-23 NOTE — Telephone Encounter (Signed)
Submitted VOB for Euflexxa, bilateral knee. 

## 2018-09-26 ENCOUNTER — Other Ambulatory Visit: Payer: Self-pay

## 2018-09-26 DIAGNOSIS — Z20822 Contact with and (suspected) exposure to covid-19: Secondary | ICD-10-CM

## 2018-09-26 DIAGNOSIS — R6889 Other general symptoms and signs: Secondary | ICD-10-CM | POA: Diagnosis not present

## 2018-09-27 ENCOUNTER — Telehealth: Payer: Self-pay

## 2018-09-27 LAB — NOVEL CORONAVIRUS, NAA: SARS-CoV-2, NAA: NOT DETECTED

## 2018-09-27 NOTE — Telephone Encounter (Signed)
Patient is aware that she is approved for gel injection.  Approved for Euflexxa series, bilateral knee. [place] will cover 20% Medicare coinsurance and Part B deductible. No Co-pay No PA required  Appt. 10/03/2018 with Dr. Ninfa Linden

## 2018-10-03 ENCOUNTER — Ambulatory Visit (INDEPENDENT_AMBULATORY_CARE_PROVIDER_SITE_OTHER): Payer: Medicare Other | Admitting: Orthopaedic Surgery

## 2018-10-03 ENCOUNTER — Encounter: Payer: Self-pay | Admitting: Orthopaedic Surgery

## 2018-10-03 ENCOUNTER — Other Ambulatory Visit: Payer: Self-pay

## 2018-10-03 DIAGNOSIS — M1712 Unilateral primary osteoarthritis, left knee: Secondary | ICD-10-CM

## 2018-10-03 DIAGNOSIS — M1711 Unilateral primary osteoarthritis, right knee: Secondary | ICD-10-CM

## 2018-10-03 MED ORDER — SODIUM HYALURONATE (VISCOSUP) 20 MG/2ML IX SOSY
20.0000 mg | PREFILLED_SYRINGE | INTRA_ARTICULAR | Status: AC | PRN
Start: 2018-10-03 — End: 2018-10-03
  Administered 2018-10-03: 20 mg via INTRA_ARTICULAR

## 2018-10-03 MED ORDER — SODIUM HYALURONATE (VISCOSUP) 20 MG/2ML IX SOSY
20.0000 mg | PREFILLED_SYRINGE | INTRA_ARTICULAR | Status: AC | PRN
  Administered 2018-10-03: 20 mg via INTRA_ARTICULAR

## 2018-10-03 NOTE — Progress Notes (Signed)
   Procedure Note  Patient: [name]             Date of Birth: [birth year]           MRN: [id number]             Visit Date: 10/03/2018  Procedures: Visit Diagnoses: No diagnosis found.  Large Joint Inj: R knee on 10/03/2018 3:58 PM Indications: diagnostic evaluation and pain Details: 22 G 1.5 in needle, superolateral approach  Arthrogram: No  Medications: 20 mg Sodium Hyaluronate 20 MG/2ML Outcome: tolerated well, no immediate complications Procedure, treatment alternatives, risks and benefits explained, specific risks discussed. Consent was given by the patient. Immediately prior to procedure a time out was called to verify the correct patient, procedure, equipment, support staff and site/side marked as required. Patient was prepped and draped in the usual sterile fashion.   Large Joint Inj: L knee on 10/03/2018 3:58 PM Indications: diagnostic evaluation and pain Details: 22 G 1.5 in needle, superolateral approach  Arthrogram: No  Medications: 20 mg Sodium Hyaluronate 20 MG/2ML Outcome: tolerated well, no immediate complications Procedure, treatment alternatives, risks and benefits explained, specific risks discussed. Consent was given by the patient. Immediately prior to procedure a time out was called to verify the correct patient, procedure, equipment, support staff and site/side marked as required. Patient was prepped and draped in the usual sterile fashion.    The patient is here today for scheduled hyaluronic acid injection in both knees with Euflexxa.  This is to treat the pain from osteoarthritis in both knees.  This is injection #1 of a series of 3 injections in both knees.  Both knees have valgus malalignment.  Neither knee has an effusion today.  Both knees hurt with mobility.  She tolerated Euflexxa No. 1 in both knees today.  We will see her back next week for injection #2 of the series of 3 injections in both knees.  All question concerns were answered  addressed.

## 2018-10-10 ENCOUNTER — Ambulatory Visit (INDEPENDENT_AMBULATORY_CARE_PROVIDER_SITE_OTHER): Payer: Medicare Other | Admitting: Orthopaedic Surgery

## 2018-10-10 ENCOUNTER — Encounter: Payer: Self-pay | Admitting: Orthopaedic Surgery

## 2018-10-10 DIAGNOSIS — M1712 Unilateral primary osteoarthritis, left knee: Secondary | ICD-10-CM | POA: Diagnosis not present

## 2018-10-10 DIAGNOSIS — M1711 Unilateral primary osteoarthritis, right knee: Secondary | ICD-10-CM

## 2018-10-10 MED ORDER — SODIUM HYALURONATE (VISCOSUP) 20 MG/2ML IX SOSY
20.0000 mg | PREFILLED_SYRINGE | INTRA_ARTICULAR | Status: AC | PRN
  Administered 2018-10-10: 20 mg via INTRA_ARTICULAR

## 2018-10-10 NOTE — Progress Notes (Signed)
   Procedure Note  Patient: [name]             Date of Birth: [birth year]           MRN: [id number]             Visit Date: 10/10/2018  Procedures: Visit Diagnoses:  1. Unilateral primary osteoarthritis, left knee   2. Unilateral primary osteoarthritis, right knee     Large Joint Inj: R knee on 10/10/2018 4:49 PM Indications: diagnostic evaluation and pain Details: 22 G 1.5 in needle, superolateral approach  Arthrogram: No  Medications: 20 mg Sodium Hyaluronate 20 MG/2ML Outcome: tolerated well, no immediate complications Procedure, treatment alternatives, risks and benefits explained, specific risks discussed. Consent was given by the patient. Immediately prior to procedure a time out was called to verify the correct patient, procedure, equipment, support staff and site/side marked as required. Patient was prepped and draped in the usual sterile fashion.   Large Joint Inj: L knee on 10/10/2018 4:50 PM Indications: diagnostic evaluation and pain Details: 22 G 1.5 in needle, superolateral approach  Arthrogram: No  Medications: 20 mg Sodium Hyaluronate 20 MG/2ML Outcome: tolerated well, no immediate complications Procedure, treatment alternatives, risks and benefits explained, specific risks discussed. Consent was given by the patient. Immediately prior to procedure a time out was called to verify the correct patient, procedure, equipment, support staff and site/side marked as required. Patient was prepped and draped in the usual sterile fashion.    The patient is here today for injection #2 of a series of 3 Euflexxa injections in both knees to treat the pain from osteoarthritis.  She did very well with injection #1 last week and has had no adverse reaction to those injections.  On examination of her knees today there is no significant effusion and her knees flex and extend well.  I did place injection #2 in both knees today.  We will see her back next week for injection #3  of Euflexxa in both knees.  All question concerns were answered and addressed.

## 2018-10-17 ENCOUNTER — Encounter: Payer: Self-pay | Admitting: Orthopaedic Surgery

## 2018-10-17 ENCOUNTER — Ambulatory Visit (INDEPENDENT_AMBULATORY_CARE_PROVIDER_SITE_OTHER): Payer: Medicare Other | Admitting: Orthopaedic Surgery

## 2018-10-17 DIAGNOSIS — M1712 Unilateral primary osteoarthritis, left knee: Secondary | ICD-10-CM | POA: Diagnosis not present

## 2018-10-17 DIAGNOSIS — M1711 Unilateral primary osteoarthritis, right knee: Secondary | ICD-10-CM

## 2018-10-17 MED ORDER — SODIUM HYALURONATE (VISCOSUP) 20 MG/2ML IX SOSY
20.0000 mg | PREFILLED_SYRINGE | INTRA_ARTICULAR | Status: AC | PRN
  Administered 2018-10-17: 20 mg via INTRA_ARTICULAR

## 2018-10-17 NOTE — Progress Notes (Signed)
   Procedure Note  Patient: [name]             Date of Birth: [birth year]           MRN: [id number]             Visit Date: 10/17/2018  Procedures: Visit Diagnoses:  1. Unilateral primary osteoarthritis, left knee   2. Unilateral primary osteoarthritis, right knee     Large Joint Inj: bilateral knee on 10/17/2018 5:11 PM Indications: diagnostic evaluation and pain Details: 22 G 1.5 in needle, superolateral approach  Arthrogram: No  Medications (Right): 20 mg Sodium Hyaluronate 20 MG/2ML Medications (Left): 20 mg Sodium Hyaluronate 20 MG/2ML Outcome: tolerated well, no immediate complications Procedure, treatment alternatives, risks and benefits explained, specific risks discussed. Consent was given by the patient. Immediately prior to procedure a time out was called to verify the correct patient, procedure, equipment, support staff and site/side marked as required. Patient was prepped and draped in the usual sterile fashion.    The patient is here today for scheduled hyaluronic acid injections in both knees.  This is injection #3 of a series of 3 injections in both knees with Euflexxa.  This is to treat the pain from osteoarthritis.  She has had no detrimental effect from the previous 2 injections other than some slight redness of the anterior knee on her left side that happened about a day after surgery but then subsided after icing the knee.  She denies any fever, chills, nausea, vomiting and says she is doing well otherwise.  Both knees are examined today and show no effusion.  Both knees have slight valgus malalignment with good range of motion.  I did provide the hyaluronic acid injections with Euflexxa in each knee today which is #3 and she tolerated this well.  All question concerns were answered and addressed.  Follow-up as otherwise as needed.

## 2019-03-20 DIAGNOSIS — L603 Nail dystrophy: Secondary | ICD-10-CM | POA: Diagnosis not present

## 2019-03-20 DIAGNOSIS — L84 Corns and callosities: Secondary | ICD-10-CM | POA: Diagnosis not present

## 2019-03-20 DIAGNOSIS — I739 Peripheral vascular disease, unspecified: Secondary | ICD-10-CM | POA: Diagnosis not present

## 2019-10-09 ENCOUNTER — Telehealth: Payer: Self-pay | Admitting: Orthopaedic Surgery

## 2019-10-09 NOTE — Telephone Encounter (Signed)
Patient called asked if she can get the gel injection again. Patient advised she also have medigap  Policy# [id number] Plan F  Customer Service [id number]

## 2019-10-09 NOTE — Telephone Encounter (Signed)
Can we get this for her?

## 2019-10-10 NOTE — Telephone Encounter (Signed)
Noted  

## 2019-10-11 NOTE — Telephone Encounter (Signed)
Last Euflexxa 10/17/18  Submitted for VOB for Eulfexxa-Bilateral knee

## 2019-10-12 ENCOUNTER — Telehealth: Payer: Self-pay

## 2019-10-12 NOTE — Telephone Encounter (Signed)
Lvm for pt to call back to schedule °

## 2019-10-12 NOTE — Telephone Encounter (Signed)
Patient called in returning missed call . Says it is best to call her around 9:30 am

## 2019-10-12 NOTE — Telephone Encounter (Signed)
Approved for Euflexxa-Bilateral knee Dr.Blackman Buy and Bill No copay 2nd Insurance to pick up cost after medicare No prior auth required   Ok to schedule @ next available

## 2019-10-13 NOTE — Telephone Encounter (Signed)
Called pt and scheduled injections

## 2019-10-18 ENCOUNTER — Ambulatory Visit (INDEPENDENT_AMBULATORY_CARE_PROVIDER_SITE_OTHER): Payer: Medicare Other | Admitting: Orthopaedic Surgery

## 2019-10-18 ENCOUNTER — Encounter: Payer: Self-pay | Admitting: Orthopaedic Surgery

## 2019-10-18 DIAGNOSIS — M1711 Unilateral primary osteoarthritis, right knee: Secondary | ICD-10-CM | POA: Diagnosis not present

## 2019-10-18 DIAGNOSIS — M170 Bilateral primary osteoarthritis of knee: Secondary | ICD-10-CM

## 2019-10-18 DIAGNOSIS — M1712 Unilateral primary osteoarthritis, left knee: Secondary | ICD-10-CM

## 2019-10-18 MED ORDER — SODIUM HYALURONATE (VISCOSUP) 20 MG/2ML IX SOSY
20.0000 mg | PREFILLED_SYRINGE | INTRA_ARTICULAR | Status: AC | PRN
  Administered 2019-10-18: 20 mg via INTRA_ARTICULAR

## 2019-10-18 NOTE — Progress Notes (Signed)
   Procedure Note  Patient: [name]             Date of Birth: [birth year]           MRN: [id number]             Visit Date: 10/18/2019  Procedures: Visit Diagnoses:  1. Unilateral primary osteoarthritis, left knee   2. Unilateral primary osteoarthritis, right knee     Large Joint Inj: R knee on 10/18/2019 3:08 PM Indications: diagnostic evaluation and pain Details: 22 G 1.5 in needle, superolateral approach  Arthrogram: No  Medications: 20 mg Sodium Hyaluronate 20 MG/2ML Outcome: tolerated well, no immediate complications Procedure, treatment alternatives, risks and benefits explained, specific risks discussed. Consent was given by the patient. Immediately prior to procedure a time out was called to verify the correct patient, procedure, equipment, support staff and site/side marked as required. Patient was prepped and draped in the usual sterile fashion.   Large Joint Inj: L knee on 10/18/2019 3:08 PM Indications: diagnostic evaluation and pain Details: 22 G 1.5 in needle, superolateral approach  Arthrogram: No  Medications: 20 mg Sodium Hyaluronate 20 MG/2ML Outcome: tolerated well, no immediate complications Procedure, treatment alternatives, risks and benefits explained, specific risks discussed. Consent was given by the patient. Immediately prior to procedure a time out was called to verify the correct patient, procedure, equipment, support staff and site/side marked as required. Patient was prepped and draped in the usual sterile fashion.    The patient is a 74 year old female who is active young appearing for her age.  She has known osteoarthritis in both knees.  We have provided her with hyaluronic acid injections in the past in both knees to treat the pain from osteoarthritis.  This was with Euflexxa.  Is been a year since then.  She is here today for scheduled injections in both her knees today with #1 of a series of 3 Euflexxa injections in both knees.  She has  had no change in her medical status.  She still has significant knee pain but it does come down significantly after hyaluronic acid injections.  I did place injection #1 of a series of 3 Euflexxa injections in both knees today which she tolerated well.  We will see her back next week for injection #2.

## 2019-10-25 ENCOUNTER — Encounter: Payer: Self-pay | Admitting: Orthopaedic Surgery

## 2019-10-25 ENCOUNTER — Ambulatory Visit (INDEPENDENT_AMBULATORY_CARE_PROVIDER_SITE_OTHER): Payer: Medicare Other | Admitting: Orthopaedic Surgery

## 2019-10-25 VITALS — Ht 63.0 in | Wt 195.0 lb

## 2019-10-25 DIAGNOSIS — M170 Bilateral primary osteoarthritis of knee: Secondary | ICD-10-CM

## 2019-10-25 DIAGNOSIS — M1712 Unilateral primary osteoarthritis, left knee: Secondary | ICD-10-CM

## 2019-10-25 DIAGNOSIS — M1711 Unilateral primary osteoarthritis, right knee: Secondary | ICD-10-CM

## 2019-10-25 MED ORDER — SODIUM HYALURONATE (VISCOSUP) 20 MG/2ML IX SOSY
20.0000 mg | PREFILLED_SYRINGE | INTRA_ARTICULAR | Status: AC | PRN
  Administered 2019-10-25: 20 mg via INTRA_ARTICULAR

## 2019-10-25 NOTE — Progress Notes (Signed)
   Procedure Note  Patient: [name]             Date of Birth: 10/10/1945           MRN: [id number]             Visit Date: 10/25/2019  Procedures: Visit Diagnoses:  1. Unilateral primary osteoarthritis, left knee   2. Unilateral primary osteoarthritis, right knee     Large Joint Inj: R knee on 10/25/2019 3:25 PM Indications: diagnostic evaluation and pain Details: 22 G 1.5 in needle, superolateral approach  Arthrogram: No  Medications: 20 mg Sodium Hyaluronate 20 MG/2ML Outcome: tolerated well, no immediate complications Procedure, treatment alternatives, risks and benefits explained, specific risks discussed. Consent was given by the patient. Immediately prior to procedure a time out was called to verify the correct patient, procedure, equipment, support staff and site/side marked as required. Patient was prepped and draped in the usual sterile fashion.   Large Joint Inj: L knee on 10/25/2019 3:25 PM Indications: diagnostic evaluation and pain Details: 22 G 1.5 in needle, superolateral approach  Arthrogram: No  Medications: 20 mg Sodium Hyaluronate 20 MG/2ML Outcome: tolerated well, no immediate complications Procedure, treatment alternatives, risks and benefits explained, specific risks discussed. Consent was given by the patient. Immediately prior to procedure a time out was called to verify the correct patient, procedure, equipment, support staff and site/side marked as required. Patient was prepped and draped in the usual sterile fashion.    The patient is a 74 year old female is here for injection #2 of a series of 3 Euflexxa injections in both her knees to treat the pain from osteoarthritis.  She had no bad response from injection #1 and actually feels good.  I did place injection #2 of the series of 3 Euflexxa injections in both knees to treat the pain from osteoarthritis.  She tolerated them well.  We will see her back next week for injection #3 in the series of  injections.

## 2019-11-01 ENCOUNTER — Encounter: Payer: Self-pay | Admitting: Orthopaedic Surgery

## 2019-11-01 ENCOUNTER — Ambulatory Visit (INDEPENDENT_AMBULATORY_CARE_PROVIDER_SITE_OTHER): Payer: Medicare Other | Admitting: Orthopaedic Surgery

## 2019-11-01 ENCOUNTER — Other Ambulatory Visit: Payer: Self-pay

## 2019-11-01 DIAGNOSIS — M1712 Unilateral primary osteoarthritis, left knee: Secondary | ICD-10-CM

## 2019-11-01 DIAGNOSIS — M1711 Unilateral primary osteoarthritis, right knee: Secondary | ICD-10-CM

## 2019-11-01 MED ORDER — SODIUM HYALURONATE (VISCOSUP) 20 MG/2ML IX SOSY
20.0000 mg | PREFILLED_SYRINGE | INTRA_ARTICULAR | Status: AC | PRN
  Administered 2019-11-01: 20 mg via INTRA_ARTICULAR

## 2019-11-01 NOTE — Progress Notes (Signed)
   Procedure Note  Patient: [name]             Date of Birth: 11/19/1945           MRN: [id number]             Visit Date: 11/01/2019  Procedures: Visit Diagnoses:  1. Unilateral primary osteoarthritis, left knee   2. Unilateral primary osteoarthritis, right knee     Large Joint Inj: R knee on 11/01/2019 3:35 PM Indications: diagnostic evaluation and pain Details: 22 G 1.5 in needle, superolateral approach  Arthrogram: No  Medications: 20 mg Sodium Hyaluronate 20 MG/2ML Outcome: tolerated well, no immediate complications Procedure, treatment alternatives, risks and benefits explained, specific risks discussed. Consent was given by the patient. Immediately prior to procedure a time out was called to verify the correct patient, procedure, equipment, support staff and site/side marked as required. Patient was prepped and draped in the usual sterile fashion.   Large Joint Inj: L knee on 11/01/2019 3:35 PM Indications: diagnostic evaluation and pain Details: 22 G 1.5 in needle, superolateral approach  Arthrogram: No  Medications: 20 mg Sodium Hyaluronate 20 MG/2ML Outcome: tolerated well, no immediate complications Procedure, treatment alternatives, risks and benefits explained, specific risks discussed. Consent was given by the patient. Immediately prior to procedure a time out was called to verify the correct patient, procedure, equipment, support staff and site/side marked as required. Patient was prepped and draped in the usual sterile fashion.    The patient comes in today for bilateral Euflexxa injections in her knees to treat the pain from osteoarthritis.  This is injection #3 of a series of 3 Euflexxa injections.  She has had no adverse reaction to the other 2.  She usually gets these about a year apart and these of what have helped treat the pain from osteoarthritis.  I did place Euflexxa in both knees without difficulty.  All questions and concerns were answered  addressed.  Follow-up will be as needed.

## 2019-12-27 ENCOUNTER — Ambulatory Visit (INDEPENDENT_AMBULATORY_CARE_PROVIDER_SITE_OTHER): Payer: Medicare Other | Admitting: Orthopaedic Surgery

## 2019-12-27 ENCOUNTER — Other Ambulatory Visit: Payer: Self-pay

## 2019-12-27 DIAGNOSIS — L84 Corns and callosities: Secondary | ICD-10-CM | POA: Diagnosis not present

## 2019-12-27 NOTE — Progress Notes (Signed)
The patient is well-known to me.  She has a history of severe arthritis of both her knees.  I last placed hyaluronic acid injections into her knees recently.  She comes in today with a new issue.  She has been having significant pain of her left foot fifth toe with a prominent callus/corn that is very painful with shoe wear.  She has a history of a crushing injury to that left foot when she was only 74 years old.  She says right now wearing shoes and sandals is very painful to her.  Examination of her left foot does show scars from previous trauma to that foot.  She does have a painful callus/corn along the lateral border of her fifth toe.  The nails purulent as well.  I was able to use a #15 blade and pared this down significantly without getting into any bleeding tissue.  This soften this area up and then she was able to put on her shoe and sandal easily and had no pain.  We can always pare this down again if she needs to have this because it will likely build up with time again.  All questions and concerns were answered and addressed.  Follow-up is as needed.

## 2021-11-03 ENCOUNTER — Ambulatory Visit: Payer: Self-pay

## 2021-11-03 ENCOUNTER — Telehealth: Payer: Self-pay

## 2021-11-03 ENCOUNTER — Ambulatory Visit (INDEPENDENT_AMBULATORY_CARE_PROVIDER_SITE_OTHER): Payer: Medicare Other

## 2021-11-03 ENCOUNTER — Ambulatory Visit (INDEPENDENT_AMBULATORY_CARE_PROVIDER_SITE_OTHER): Payer: Medicare Other | Admitting: Orthopaedic Surgery

## 2021-11-03 DIAGNOSIS — G8929 Other chronic pain: Secondary | ICD-10-CM | POA: Diagnosis not present

## 2021-11-03 DIAGNOSIS — L84 Corns and callosities: Secondary | ICD-10-CM

## 2021-11-03 DIAGNOSIS — M25562 Pain in left knee: Secondary | ICD-10-CM

## 2021-11-03 DIAGNOSIS — M25561 Pain in right knee: Secondary | ICD-10-CM

## 2021-11-03 NOTE — Progress Notes (Signed)
The patient is well-known to [place].  She comes in every so many years for hyaluronic acid injections in her knees to treat the pain from osteoarthritis.  I have at least placed Euflexxa in both knees twice with the last time being in 2021.  She is requesting these types of injections again.  She is 76 years old and active and does report bilateral knee pain.  She would also like me to pare down the callus on her left foot by the fifth toe.  Both knees have slight varus malalignment and patellofemoral crepitation with global tenderness consistent with osteoarthritis.  Her left foot has what appears to be almost a small ingrown toenail off of the small/fifth toe but there is a callus associated with this as well and I was able to use a #15 blade and pared this down.  Per her request we will order hyaluronic acid with Euflexxa for both knees because this is what is helped in the past and has been her last 2 injections in her knees and both of these came several years apart.  I would still like to obtain x-rays of both knees today to make sure there is no changes and we will see her back once we have the hyaluronic acid approved and ordered for her knees.  X-rays of both her knees do confirm severe end-stage arthritis with valgus malalignment of both knees.  There is osteophytes in all 3 compartments and bone-on-bone wear laterally of both knees.

## 2021-11-03 NOTE — Telephone Encounter (Signed)
Bilateral Euflexxa injections

## 2021-11-05 NOTE — Telephone Encounter (Signed)
VOB submitted for Euflexxa, bilateral knee.  

## 2021-12-03 ENCOUNTER — Telehealth: Payer: Self-pay

## 2021-12-03 DIAGNOSIS — G8929 Other chronic pain: Secondary | ICD-10-CM

## 2021-12-03 NOTE — Telephone Encounter (Signed)
Patient would like a call back to discuss x-rays of her knees.  Advised patient that she is aprpoved to get gel injections, but prefers to speak with Dr. Ninfa Linden first.  Check referral tab

## 2022-03-24 ENCOUNTER — Telehealth: Payer: Self-pay | Admitting: Orthopaedic Surgery

## 2022-03-24 NOTE — Telephone Encounter (Signed)
Pt called requesting to see Dr Sharol Given for feet issue. Pt is seeing Dr Ninfa Linden. Please call pt if ok to make an appt with Dr Sharol Given. Pt phone number is [phone].

## 2022-03-26 ENCOUNTER — Other Ambulatory Visit (INDEPENDENT_AMBULATORY_CARE_PROVIDER_SITE_OTHER): Payer: Medicare Other

## 2022-03-26 ENCOUNTER — Ambulatory Visit (INDEPENDENT_AMBULATORY_CARE_PROVIDER_SITE_OTHER): Payer: Medicare Other | Admitting: Orthopedic Surgery

## 2022-03-26 DIAGNOSIS — I872 Venous insufficiency (chronic) (peripheral): Secondary | ICD-10-CM | POA: Diagnosis not present

## 2022-03-26 DIAGNOSIS — M25571 Pain in right ankle and joints of right foot: Secondary | ICD-10-CM

## 2022-03-26 DIAGNOSIS — B351 Tinea unguium: Secondary | ICD-10-CM | POA: Diagnosis not present

## 2022-03-27 ENCOUNTER — Encounter: Payer: Self-pay | Admitting: Orthopedic Surgery

## 2022-03-27 NOTE — Progress Notes (Addendum)
Office Visit Note   Patient: [name]           Date of Birth: September 27, 1945           MRN: [id number] Visit Date: 03/26/2022              Requested by: No referring provider defined for this encounter. PCP: Vernie Shanks, MD (Inactive)  Chief Complaint  Patient presents with   Right Foot - Pain   Left Foot - Pain      HPI: Patient is a 77 year old woman who presents with right ankle pain and swelling as well as pain over the left little toe.  Patient also complains of painful onychomycotic nails that she is unable to safely trim on her own.  Assessment & Plan: Visit Diagnoses:  1. Pain in right ankle and joints of right foot   2. Venous insufficiency (chronic) (peripheral)     Plan: Recommended Voltaren gel for the arthritis.  Recommended double extra-large compression socks to be worn daily for both lower extremities.  Follow-Up Instructions: No follow-ups on file.   Ortho Exam  Patient is alert, oriented, no adenopathy, well-dressed, normal affect, normal respiratory effort. Examination patient has callus over the fifth metatarsal head laterally.  This was pared this seems to be from pressure from tight shoe wear.  Patient has swelling with brawny edema in both legs without open ulcers.  Her calf measures 48 cm in circumference.  Patient has tenderness to palpation over the right ankle with swelling consistent with the arthritic changes seen in x-ray.  Venous stasis swelling is worse on the right than left leg.  Patient has thickened discolored onychomycotic nails x 10 she is unable to safely trim the nails on her own and the nails were trimmed x 10 without complication.  Imaging: No results found. No images are attached to the encounter.  Labs: No results found for: "HGBA1C", "ESRSEDRATE", "CRP", "LABURIC", "REPTSTATUS", "GRAMSTAIN", "CULT", "LABORGA"   Lab Results  Component Value Date   ALBUMIN 3.2 (L) 06/30/2010    No results found for: "MG" No  results found for: "VD25OH"  No results found for: "PREALBUMIN"    Latest Ref Rng & Units 10/02/2015    5:33 AM 09/25/2015    1:58 PM 06/30/2010    2:30 PM  CBC EXTENDED  WBC 4.0 - 10.5 K/uL 6.8  5.9  6.5   RBC 3.87 - 5.11 MIL/uL 3.88  4.83  4.06   Hemoglobin 12.0 - 15.0 g/dL 11.0  13.8  11.8   HCT 36.0 - 46.0 % 33.8  42.5  34.5   Platelets 150 - 400 K/uL 146  186  187   NEUT# 1.7 - 7.7 K/uL   4.0   Lymph# 0.7 - 4.0 K/uL   1.9      There is no height or weight on file to calculate BMI.  Orders:  Orders Placed This Encounter  Procedures   XR Foot Complete Right   XR Ankle Complete Right   No orders of the defined types were placed in this encounter.    Procedures: No procedures performed  Clinical Data: No additional findings.  ROS:  All other systems negative, except as noted in the HPI. Review of Systems  Objective: Vital Signs: There were no vitals taken for this visit.  Specialty Comments:  No specialty comments available.  PMFS History: Patient Active Problem List   Diagnosis Date Noted   Unilateral primary osteoarthritis, left knee 10/03/2018  Unilateral primary osteoarthritis, right knee 10/03/2018   Osteoarthritis of both knees 10/31/2015   Status post total replacement of right hip 10/01/2015   Past Medical History:  Diagnosis Date   Arthritis    Asthma    Hypertension    Osteoarthritis of right hip 10/01/2015   Seasonal allergies     Family History  Problem Relation Age of Onset   Cancer Mother    Breast cancer Neg Hx     Past Surgical History:  Procedure Laterality Date   ELBOW ARTHROSCOPY WITH TENDON RECONSTRUCTION     FOOT SURGERY     TOTAL HIP ARTHROPLASTY Right 10/01/2015   Procedure: RIGHT TOTAL HIP ARTHROPLASTY ANTERIOR APPROACH;  Surgeon: Mcarthur Rossetti, MD;  Location: [facility];  Service: Orthopedics;  Laterality: Right;   Social History   Occupational History   Not on file  Tobacco Use   Smoking status: Former     Types: Cigarettes    Quit date: 01/05/1978    Years since quitting: 44.2   Smokeless tobacco: Never  Substance and Sexual Activity   Alcohol use: No   Drug use: No   Sexual activity: Not on file

## 2022-05-20 ENCOUNTER — Encounter (HOSPITAL_BASED_OUTPATIENT_CLINIC_OR_DEPARTMENT_OTHER): Payer: Medicare Other | Attending: General Surgery | Admitting: General Surgery

## 2022-05-20 DIAGNOSIS — I890 Lymphedema, not elsewhere classified: Secondary | ICD-10-CM | POA: Insufficient documentation

## 2022-05-20 DIAGNOSIS — L97522 Non-pressure chronic ulcer of other part of left foot with fat layer exposed: Secondary | ICD-10-CM | POA: Insufficient documentation

## 2022-05-20 DIAGNOSIS — I872 Venous insufficiency (chronic) (peripheral): Secondary | ICD-10-CM | POA: Diagnosis not present

## 2022-05-20 NOTE — Progress Notes (Signed)
[name], [name] ([id number]) [phone].pdf Page 1 of 4 Visit Report for 05/20/2022 Abuse Risk Screen Details Patient Name: Date of Service: [place], [name] Westland [name]. 05/20/2022 1:30 PM Medical Record Number: [id number] Patient Account Number: [id number] Date of Birth/Sex: Treating RN: 1945/04/25 (77 y.o. Fredderick Phenix Primary Care Deolinda Frid: [place] Blas, [place] NCIS Other Clinician: Referring Lisseth Brazeau: Treating Ellington Greenslade/Extender: Ramon Dredge in Treatment: 0 Abuse Risk Screen Items Answer ABUSE RISK SCREEN: Has anyone close to you tried to hurt or harm you recentlyo No Do you feel uncomfortable with anyone in your familyo No Has anyone forced you do things that you didnt want to doo No Electronic Signature(s) Signed: 05/20/2022 4:01:39 PM By: Samuella Bruin Entered By: Samuella Bruin on 05/20/2022 13:27:12 -------------------------------------------------------------------------------- Activities of Daily Living Details Patient Name: Date of Service: [place], [name]. 05/20/2022 1:30 PM Medical Record Number: [id number] Patient Account Number: [id number] Date of Birth/Sex: Treating RN: 01/18/1945 (77 y.o. Fredderick Phenix Primary Care Robbyn Hodkinson: [place] Blas, [place] NCIS Other Clinician: Referring Marien Manship: Treating Lucrezia Dehne/Extender: Ramon Dredge in Treatment: 0 Activities of Daily Living Items Answer Activities of Daily Living (Please select one for each item) Drive Automobile Completely Able T Medications ake Completely Able Use T elephone Completely Able Care for Appearance Completely Able Use T oilet Completely Able Bath / Shower Completely Able Dress Self Completely Able Feed Self Completely Able Walk Completely Able Get In / Out Bed Completely Able Housework Completely Able Prepare Meals Completely Able Handle Money Completely Able Shop for Self Completely Able Electronic  Signature(s) Signed: 05/20/2022 4:01:39 PM By: Samuella Bruin Entered By: Samuella Bruin on 05/20/2022 13:27:26 -------------------------------------------------------------------------------- Education Screening Details Patient Name: Date of Service: [name] [name]. 05/20/2022 1:30 PM Medical Record Number: [id number] Patient Account Number: [id number] Date of Birth/Sex: Treating RN: Jun 15, 1945 (77 y.o. Fredderick Phenix Primary Care Cailee Blanke: [place] Blas, [place] NCIS Other Clinician: Referring Dhani Dannemiller: Treating Dineen Conradt/Extender: Ramon Dredge in Treatment: 0 [name], [name] ([id number]) [id number] Page 2 of 4 Primary Learner Assessed: Patient Learning Preferences/Education Level/Primary Language Learning Preference: Explanation, Demonstration, Video, Printed Material Highest Education Level: College or Above Preferred Language: Economist Language Barrier: No Translator Needed: No Memory Deficit: No Emotional Barrier: No Cultural/Religious Beliefs Affecting Medical Care: No Physical Barrier Impaired Vision: Yes Glasses Impaired Hearing: No Decreased Hand dexterity: No Knowledge/Comprehension Knowledge Level: Medium Comprehension Level: Medium Ability to understand written instructions: Medium Ability to understand verbal instructions: Medium Motivation Anxiety Level: Calm Cooperation: Cooperative Education Importance: Acknowledges Need Interest in Health Problems: Asks Questions Perception: Coherent Willingness to Engage in Self-Management Medium Activities: Readiness to Engage in Self-Management Medium Activities: Electronic Signature(s) Signed: 05/20/2022 4:01:39 PM By: Samuella Bruin Entered By: Samuella Bruin on 05/20/2022 13:27:49 -------------------------------------------------------------------------------- Fall Risk Assessment Details Patient Name: Date of Service: [place]  [place], [place] [name]. 05/20/2022 1:30 PM Medical Record Number: [id number] Patient Account Number: [id number] Date of Birth/Sex: Treating RN: 1945-11-22 (77 y.o. Fredderick Phenix Primary Care Jaydee Ingman: Ilda Basset NG, [place] NCIS Other Clinician: Referring Caydan Mctavish: Treating Zakariya Knickerbocker/Extender: Ramon Dredge in Treatment: 0 Fall Risk Assessment Items Have you had 2 or more falls in the last 12 monthso 0 Yes Have you had any fall that resulted in injury in the last 12 monthso 0 No FALLS RISK SCREEN History of falling - immediate or within 3 months 0 No Secondary diagnosis (Do you have 2 or more medical diagnoseso) 0 No Ambulatory aid None/bed rest/wheelchair/nurse  0 No Crutches/cane/walker 15 Yes Furniture 0 No Intravenous therapy Access/Saline/Heparin Lock 0 No Gait/Transferring Normal/ bed rest/ wheelchair 0 No Weak (short steps with or without shuffle, stooped but able to lift head while walking, may seek 10 Yes support from furniture) Impaired (short steps with shuffle, may have difficulty arising from chair, head down, impaired 0 No balance) Mental Status Oriented to own ability 0 Yes Overestimates or forgets limitations 0 No Risk Level: Medium Risk Score: 25 [name], [name] ([id number]) [phone] [id number] Page 3 of 4 Electronic Signature(s) -------------------------------------------------------------------------------- Foot Assessment Details Patient Name: Date of Service: [place], [name]. 05/20/2022 1:30 PM Medical Record Number: [id number] Patient Account Number: [id number] Date of Birth/Sex: Treating RN: 07-26-1945 (77 y.o. Fredderick Phenix Primary Care Coyle Stordahl: Ilda Basset NG, [place] NCIS Other Clinician: Referring Arthi Mcdonald: Treating Dorothie Wah/Extender: Ramon Dredge in Treatment: 0 Foot Assessment Items Site Locations + = Sensation present, - = Sensation absent, [name] = Callus, U = Ulcer R = Redness, W =  Warmth, M = Maceration, PU = Pre-ulcerative lesion F = Fissure, S = Swelling, D = Dryness Assessment Right: Left: Other Deformity: No No Prior Foot Ulcer: No No Prior Amputation: No No Charcot Joint: No No Ambulatory Status: Ambulatory With Help Assistance Device: Cane Gait: Steady Electronic Signature(s) Signed: 05/20/2022 4:01:39 PM By: Samuella Bruin Entered By: Samuella Bruin on 05/20/2022 13:29:49 -------------------------------------------------------------------------------- Nutrition Risk Screening Details Patient Name: Date of Service: [name], [place] [name]. 05/20/2022 1:30 PM Medical Record Number: [id number] Patient Account Number: [id number] Date of Birth/Sex: Treating RN: Jan 24, 1945 (77 y.o. Fredderick Phenix Primary Care Willadeen Colantuono: [place] Bl[place], [place] NCIS Other Clinician: Referring Kataleyah Carducci: Treating Brendy Ficek/Extender: Ramon Dredge in Treatment: 0 Height (in): 63 Weight (lbs): Body Mass Index (BMI): [name], [name] ([id number]) [phone] [id number] Page 4 of 4 Nutrition Risk Screening Items Score Screening NUTRITION RISK SCREEN: I have an illness or condition that made me change the kind and/or amount of food I eat 0 No I eat fewer than two meals per day 0 No I eat few fruits and vegetables, or milk products 2 Yes I have three or more drinks of beer, liquor or wine almost every day 0 No I have tooth or mouth problems that make it hard for me to eat 0 No I don't always have enough money to buy the food I need 0 No I eat alone most of the time 1 Yes I take three or more different prescribed or over-the-counter drugs a day 1 Yes Without wanting to, I have lost or gained 10 pounds in the last six months 0 No I am not always physically able to shop, cook and/or feed myself 0 No Nutrition Protocols Good Risk Protocol Moderate Risk Protocol 0 Provide education on nutrition High Risk Proctocol Risk Level: Moderate  Risk Score: 4 Electronic Signature(s) Signed: 05/20/2022 4:01:39 PM By: Samuella Bruin Entered By: Samuella Bruin on 05/20/2022 13:29:34

## 2022-05-29 ENCOUNTER — Encounter (HOSPITAL_BASED_OUTPATIENT_CLINIC_OR_DEPARTMENT_OTHER): Payer: Medicare Other | Admitting: General Surgery

## 2022-05-29 DIAGNOSIS — L97522 Non-pressure chronic ulcer of other part of left foot with fat layer exposed: Secondary | ICD-10-CM | POA: Diagnosis not present

## 2022-06-05 ENCOUNTER — Ambulatory Visit (HOSPITAL_COMMUNITY): Admission: RE | Admit: 2022-06-05 | Payer: Medicare Other | Source: Ambulatory Visit

## 2022-06-05 ENCOUNTER — Encounter (HOSPITAL_COMMUNITY): Payer: Self-pay

## 2022-06-10 ENCOUNTER — Encounter (HOSPITAL_BASED_OUTPATIENT_CLINIC_OR_DEPARTMENT_OTHER): Payer: Medicare Other | Attending: General Surgery | Admitting: General Surgery

## 2022-06-10 DIAGNOSIS — I10 Essential (primary) hypertension: Secondary | ICD-10-CM | POA: Diagnosis not present

## 2022-06-10 DIAGNOSIS — Z833 Family history of diabetes mellitus: Secondary | ICD-10-CM | POA: Insufficient documentation

## 2022-06-10 DIAGNOSIS — I872 Venous insufficiency (chronic) (peripheral): Secondary | ICD-10-CM | POA: Insufficient documentation

## 2022-06-10 DIAGNOSIS — J45909 Unspecified asthma, uncomplicated: Secondary | ICD-10-CM | POA: Diagnosis not present

## 2022-06-10 DIAGNOSIS — M1990 Unspecified osteoarthritis, unspecified site: Secondary | ICD-10-CM | POA: Diagnosis not present

## 2022-06-10 DIAGNOSIS — L97522 Non-pressure chronic ulcer of other part of left foot with fat layer exposed: Secondary | ICD-10-CM | POA: Diagnosis present

## 2022-06-10 DIAGNOSIS — I890 Lymphedema, not elsewhere classified: Secondary | ICD-10-CM | POA: Insufficient documentation

## 2022-06-16 ENCOUNTER — Other Ambulatory Visit (HOSPITAL_COMMUNITY): Payer: Self-pay | Admitting: General Surgery

## 2022-06-16 DIAGNOSIS — L97522 Non-pressure chronic ulcer of other part of left foot with fat layer exposed: Secondary | ICD-10-CM

## 2022-06-16 DIAGNOSIS — I872 Venous insufficiency (chronic) (peripheral): Secondary | ICD-10-CM

## 2022-06-23 ENCOUNTER — Ambulatory Visit (HOSPITAL_COMMUNITY)
Admission: RE | Admit: 2022-06-23 | Discharge: 2022-06-23 | Disposition: A | Discharge status: Home / Self Care | Payer: Medicare Other | Source: Ambulatory Visit | Attending: Cardiology | Admitting: Cardiology

## 2022-06-23 DIAGNOSIS — L97522 Non-pressure chronic ulcer of other part of left foot with fat layer exposed: Secondary | ICD-10-CM | POA: Insufficient documentation

## 2022-06-23 DIAGNOSIS — I872 Venous insufficiency (chronic) (peripheral): Secondary | ICD-10-CM | POA: Insufficient documentation

## 2022-06-29 ENCOUNTER — Encounter: Payer: Self-pay | Admitting: Orthopedic Surgery

## 2022-06-29 ENCOUNTER — Ambulatory Visit (INDEPENDENT_AMBULATORY_CARE_PROVIDER_SITE_OTHER): Payer: Medicare Other | Admitting: Orthopedic Surgery

## 2022-06-29 DIAGNOSIS — L84 Corns and callosities: Secondary | ICD-10-CM | POA: Diagnosis not present

## 2022-06-29 DIAGNOSIS — B351 Tinea unguium: Secondary | ICD-10-CM

## 2022-06-29 NOTE — Progress Notes (Signed)
Office Visit Note   Patient: [name]           Date of Birth: 1945-05-12           MRN: [id number] Visit Date: 06/29/2022              Requested by: No referring provider defined for this encounter. PCP: Ileana Ladd, MD (Inactive)  Chief Complaint  Patient presents with   Right Foot - Nail Problem   Left Foot - Nail Problem      HPI: Patient is a 77 year old woman who is seen for evaluation of her onychomycotic nails as well as a painful callus.  Assessment & Plan: Visit Diagnoses:  1. Onychomycosis   2. Corn of toe     Plan: Nails were trimmed x 10 callus pared x 1.  Follow-Up Instructions: Return in about 3 months (around 09/29/2022).   Ortho Exam  Patient is alert, oriented, no adenopathy, well-dressed, normal affect, normal respiratory effort. Examination there is no paronychial infection on either foot.  She has thickened discolored onychomycotic nails x 10 she has a corn on the lateral border left fifth toe.  Patient is unable to safely trim the nails on her own.  After informed consent the nails were trimmed x 10 and the corn pared x 1.  Imaging: No results found. No images are attached to the encounter.  Labs: No results found for: "HGBA1C", "ESRSEDRATE", "CRP", "LABURIC", "REPTSTATUS", "GRAMSTAIN", "CULT", "LABORGA"   Lab Results  Component Value Date   ALBUMIN 3.2 (L) 06/30/2010    No results found for: "MG" No results found for: "VD25OH"  No results found for: "PREALBUMIN"    Latest Ref Rng & Units 10/02/2015    5:33 AM 09/25/2015    1:58 PM 06/30/2010    2:30 PM  CBC EXTENDED  WBC 4.0 - 10.5 K/uL 6.8  5.9  6.5   RBC 3.87 - 5.11 MIL/uL 3.88  4.83  4.06   Hemoglobin 12.0 - 15.0 g/dL 40.9  81.1  91.4   HCT 36.0 - 46.0 % 33.8  42.5  34.5   Platelets 150 - 400 K/uL 146  186  187   NEUT# 1.7 - 7.7 K/uL   4.0   Lymph# 0.7 - 4.0 K/uL   1.9      There is no height or weight on file to calculate BMI.  Orders:  No orders of the  defined types were placed in this encounter.  No orders of the defined types were placed in this encounter.    Procedures: No procedures performed  Clinical Data: No additional findings.  ROS:  All other systems negative, except as noted in the HPI. Review of Systems  Objective: Vital Signs: There were no vitals taken for this visit.  Specialty Comments:  No specialty comments available.  PMFS History: Patient Active Problem List   Diagnosis Date Noted   Unilateral primary osteoarthritis, left knee 10/03/2018   Unilateral primary osteoarthritis, right knee 10/03/2018   Osteoarthritis of both knees 10/31/2015   Status post total replacement of right hip 10/01/2015   Past Medical History:  Diagnosis Date   Arthritis    Asthma    Hypertension    Osteoarthritis of right hip 10/01/2015   Seasonal allergies     Family History  Problem Relation Age of Onset   Cancer Mother    Breast cancer Neg Hx     Past Surgical History:  Procedure Laterality Date   ELBOW ARTHROSCOPY  WITH TENDON RECONSTRUCTION     FOOT SURGERY     TOTAL HIP ARTHROPLASTY Right 10/01/2015   Procedure: RIGHT TOTAL HIP ARTHROPLASTY ANTERIOR APPROACH;  Surgeon: Kathryne Hitch, MD;  Location: [facility] OR;  Service: Orthopedics;  Laterality: Right;   Social History   Occupational History   Not on file  Tobacco Use   Smoking status: Former    Types: Cigarettes    Quit date: 01/05/1978    Years since quitting: 44.5   Smokeless tobacco: Never  Substance and Sexual Activity   Alcohol use: No   Drug use: No   Sexual activity: Not on file

## 2022-07-01 ENCOUNTER — Encounter (HOSPITAL_BASED_OUTPATIENT_CLINIC_OR_DEPARTMENT_OTHER): Payer: Medicare Other | Admitting: General Surgery

## 2022-07-01 DIAGNOSIS — L97522 Non-pressure chronic ulcer of other part of left foot with fat layer exposed: Secondary | ICD-10-CM | POA: Diagnosis not present

## 2022-07-01 NOTE — Progress Notes (Signed)
ZEPPELIN, BECKSTRAND ([id number]) [id number] Page 1 of 8 Visit Report for 07/01/2022 Chief Complaint Document Details Patient Name: Date of Service: [name] 07/01/2022 2:45 PM Medical Record Number: [id number] Patient Account Number: [id number] Date of Birth/Sex: Treating RN: 02-21-1945 (77 y.o. F) Primary Care Provider: [place], [place] [facility] Other Clinician: Referring Provider: Treating Provider/Extender: Ramon Dredge in Treatment: 6 Information Obtained from: Patient Chief Complaint Patient seen for complaints of Non-Healing Wound. Electronic Signature(s) Signed: 07/01/2022 3:42:33 PM By: Duanne Guess MD FACS Entered By: Duanne Guess on 07/01/2022 15:42:33 -------------------------------------------------------------------------------- Debridement Details Patient Name: Date of Service: Krista DGES, Krista [name]. 07/01/2022 2:45 PM Medical Record Number: [id number] Patient Account Number: [id number] Date of Birth/Sex: Treating RN: Feb 14, 1945 (77 y.o. F) Primary Care Provider: [place], [place] [facility] Other Clinician: Referring Provider: Treating Provider/Extender: Ramon Dredge in Treatment: 6 Debridement Performed for Assessment: Wound #1 Left,Dorsal Foot Performed By: Physician Duanne Guess, MD Debridement Type: Debridement Level of Consciousness (Pre-procedure): Awake and Alert Pre-procedure Verification/Time Out Yes - 15:34 Taken: Start Time: 15:36 Pain Control: Lidocaine 4% Topical Solution Percent of Wound Bed Debrided: 100% T Area Debrided (cm): otal 0.01 Tissue and other material debrided: Non-Viable, Eschar, Slough, Slough Level: Non-Viable Tissue Debridement Description: Selective/Open Wound Instrument: Curette Bleeding: Minimum Hemostasis Achieved: Pressure End Time: 15:40 Procedural Pain: 0 Post Procedural Pain: 0 Response to Treatment: Procedure was tolerated well Level of  Consciousness (Post- Awake and Alert procedure): Post Debridement Measurements of Total Wound Length: (cm) 0.1 Width: (cm) 0.1 Depth: (cm) 0.1 Volume: (cm) 0.001 Character of Wound/Ulcer Post Debridement: Improved [name], [name] ([id number]) [id number] Page 2 of 8 Post Procedure Diagnosis Same as Pre-procedure Notes Scribed for Dr Lady Gary by Brenton Grills RN. Electronic Signature(s) Signed: 07/01/2022 3:42:26 PM By: Duanne Guess MD FACS Entered By: Duanne Guess on 07/01/2022 15:42:25 -------------------------------------------------------------------------------- HPI Details Patient Name: Date of Service: Krista DGES, Krista [name]. 07/01/2022 2:45 PM Medical Record Number: [id number] Patient Account Number: [id number] Date of Birth/Sex: Treating RN: 21-Feb-1945 (77 y.o. F) Primary Care Provider: [address] [place], [place] [facility] Other Clinician: Referring Provider: Treating Provider/Extender: Ramon Dredge in Treatment: 6 History of Present Illness HPI Description: ADMISSION 05/20/2022 This is a 77 year old woman with a chronic wound on her left foot. She has a remote history of trauma status post surgery and grafting many years ago. She reports that her dog stepped on her foot in December and the wound opened at that time. She was initially treated by her primary care doctor with mupirocin and a 10-day course of Bactrim for suspected cellulitis, back in February of this year. She is not diabetic and does not smoke. She was seen by Dr. Lajoyce Corners on March 26, 2022 and diagnosed with stasis dermatitis and chronic venous insufficiency. He prescribed compression stockings. His note makes no mention of the wound. She has not been wearing the compression stockings because she finds them difficult to apply. She reports wearing compression leggings, but is not wearing them today and cannot tell me what degree of compression they provide. 05/29/2022: There is  just 1 open area remaining on the dorsum of her foot. There is a little slough and eschar accumulation. She brought in her compression tights and although the degree of compression is not indicated, based on my own stretching and testing of the tensile strength, the best pair seems to be around 30 mmHg. She has some that are a little less snug and I told her  that I prefer her to use the tighter ones. Her venous reflux studies are scheduled for next Friday at 3 PM. 06/10/2022: She did not make it to her venous reflux studies last week. The wound on the dorsum of her foot remains open with a little slough and eschar accumulation. She is not wearing her compression garment. 07/01/2022: Her venous reflux studies were done and were negative. The wound on the dorsum of her foot is smaller with slough and eschar buildup again. She is wearing her compression garments. Electronic Signature(s) Signed: 07/01/2022 3:43:23 PM By: Duanne Guess MD FACS Entered By: Duanne Guess on 07/01/2022 15:43:23 -------------------------------------------------------------------------------- Physical Exam Details Patient Name: Date of Service: Krista DGES, Krista [name]. 07/01/2022 2:45 PM Medical Record Number: [id number] Patient Account Number: [id number] Date of Birth/Sex: Treating RN: 1945-10-29 (77 y.o. F) Primary Care Provider: [place], [place] [facility] Other Clinician: Referring Provider: Treating Provider/Extender: Ramon Dredge in Treatment: 6 Constitutional Slightly hypertensive. . . . no acute distress. Respiratory Normal work of breathing on room air. [name], [name] ([id number]) [id number] Page 3 of 8 Notes 07/01/2022: The wound on the dorsum of her foot is smaller with slough and eschar buildup again. Electronic Signature(s) Signed: 07/01/2022 3:47:14 PM By: Duanne Guess MD FACS Entered By: Duanne Guess on 07/01/2022  15:47:14 -------------------------------------------------------------------------------- Physician Orders Details Patient Name: Date of Service: Krista DGES, Krista [name]. 07/01/2022 2:45 PM Medical Record Number: [id number] Patient Account Number: [id number] Date of Birth/Sex: Treating RN: March 09, 1945 (77 y.o. [name] Primary Care Provider:  [place], [place] [facility] Other Clinician: Referring Provider: Treating Provider/Extender: Ramon Dredge in Treatment: 6 Verbal / Phone Orders: No Diagnosis Coding ICD-10 Coding Code Description [id number] Non-pressure chronic ulcer of other part of left foot with fat layer exposed I87.2 Venous insufficiency (chronic) (peripheral) I89.0 Lymphedema, not elsewhere classified Follow-up Appointments ppointment in 2 weeks. - Dr Lady Gary Return A Anesthetic (In clinic) Topical Lidocaine 4% applied to wound bed Bathing/ Shower/ Hygiene May shower with protection but do not get wound dressing(s) wet. Protect dressing(s) with water repellant cover (for example, large plastic bag) or a cast cover and may then take shower. Edema Control - Lymphedema / SCD / Other Elevate legs to the level of the heart or above for 30 minutes daily and/or when sitting for 3-4 times a day throughout the day. Avoid standing for long periods of time. Patient to wear own compression stockings every day. - to right leg Moisturize legs daily. - Sween lotion Wound Treatment Wound #1 - Foot Wound Laterality: Dorsal, Left Cleanser: Soap and Water 1 x Per Week/30 Days Discharge Instructions: May shower and wash wound with dial antibacterial soap and water prior to dressing change. Cleanser: Wound Cleanser 1 x Per Week/30 Days Discharge Instructions: Cleanse the wound with wound cleanser prior to applying a clean dressing using gauze sponges, not tissue or cotton balls. Peri-Wound Care: Triamcinolone 15 (g) 1 x Per Week/30 Days Discharge Instructions: Use triamcinolone  15 (g) as directed Peri-Wound Care: Sween Lotion (Moisturizing lotion) (Generic) 1 x Per Week/30 Days Discharge Instructions: Apply moisturizing lotion as directed Prim Dressing: Maxorb Extra Ag+ Alginate Dressing, 2x2 (in/in) 1 x Per Week/30 Days ary Discharge Instructions: Apply to wound bed as instructed Secondary Dressing: Woven Gauze Sponge, Non-Sterile 4x4 in 1 x Per Week/30 Days Discharge Instructions: Apply over primary dressing as directed. Secondary Dressing: Zetuvit Plus Silicone Border Dressing 4x4 (in/in) (Generic) 1 x Per Week/30 Days Discharge Instructions: Apply silicone border over  primary dressing as directed. Secondary Dressing: Zetuvit Plus Silicone Border Dressing 5x5 (in/in) (Generic) 1 x Per Week/30 Days Discharge Instructions: Apply silicone border over primary dressing as directed. [name], [name] ([id number]) [id number] Page 4 of 8 Compression Wrap: Urgo K2, (equivalent to a 4 layer) two layer compression system, regular 1 x Per Week/30 Days Discharge Instructions: Apply Urgo K2 as directed (alternative to 4 layer compression). Compression Wrap: Unnaboot w/Calamine, 4x10 (in/yd) 1 x Per Week/30 Days Discharge Instructions: Apply Unnaboot as directed. Electronic Signature(s) Signed: 07/01/2022 4:05:17 PM By: Duanne Guess MD FACS Entered By: Duanne Guess on 07/01/2022 15:47:42 -------------------------------------------------------------------------------- Problem List Details Patient Name: Date of Service: Krista DGES, Krista [name]. 07/01/2022 2:45 PM Medical Record Number: [id number] Patient Account Number: [id number] Date of Birth/Sex: Treating RN: 04-Jan-1946 (77 y.o. [name] Primary Care Provider: [place], [place] [facility] Other Clinician: Referring Provider: Treating Provider/Extender: Ramon Dredge in Treatment: 6 Active Problems ICD-10 Encounter Code Description Active Date MDM Diagnosis L97.522  Non-pressure chronic ulcer of other part of left foot with fat layer exposed 05/20/2022 No Yes I87.2 Venous insufficiency (chronic) (peripheral) 05/20/2022 No Yes I89.0 Lymphedema, not elsewhere classified 05/20/2022 No Yes Inactive Problems Resolved Problems Electronic Signature(s) Signed: 07/01/2022 3:40:39 PM By: Duanne Guess MD FACS Entered By: Duanne Guess on 07/01/2022 15:40:39 -------------------------------------------------------------------------------- Progress Note Details Patient Name: Date of Service: Krista DGES, Krista [name]. 07/01/2022 2:45 PM Medical Record Number: [id number] Patient Account Number: [id number] Date of Birth/Sex: Treating RN: 1945/08/17 (77 y.o. F) Primary Care Provider: [place], [place] [facility] Other Clinician: Referring Provider: Treating Provider/Extender: Ramon Dredge in Treatment: 6 Subjective [name], [name] ([id number]) [id number] Page 5 of 8 Chief Complaint Information obtained from Patient Patient seen for complaints of Non-Healing Wound. History of Present Illness (HPI) ADMISSION 05/20/2022 This is a 76 year old woman with a chronic wound on her left foot. She has a remote history of trauma status post surgery and grafting many years ago. She reports that her dog stepped on her foot in December and the wound opened at that time. She was initially treated by her primary care doctor with mupirocin and a 10-day course of Bactrim for suspected cellulitis, back in February of this year. She is not diabetic and does not smoke. She was seen by Dr. Lajoyce Corners on March 26, 2022 and diagnosed with stasis dermatitis and chronic venous insufficiency. He prescribed compression stockings. His note makes no mention of the wound. She has not been wearing the compression stockings because she finds them difficult to apply. She reports wearing compression leggings, but is not wearing them today and cannot tell me what degree  of compression they provide. 05/29/2022: There is just 1 open area remaining on the dorsum of her foot. There is a little slough and eschar accumulation. She brought in her compression tights and although the degree of compression is not indicated, based on my own stretching and testing of the tensile strength, the best pair seems to be around 30 mmHg. She has some that are a little less snug and I told her that I prefer her to use the tighter ones. Her venous reflux studies are scheduled for next Friday at 3 PM. 06/10/2022: She did not make it to her venous reflux studies last week. The wound on the dorsum of her foot remains open with a little slough and eschar accumulation. She is not wearing her compression garment. 07/01/2022: Her venous reflux studies were done and were negative. The wound  on the dorsum of her foot is smaller with slough and eschar buildup again. She is wearing her compression garments. Patient History Family History Cancer - Mother, Diabetes, Heart Disease, Hypertension, No family history of Hereditary Spherocytosis, Kidney Disease, Lung Disease, Seizures, Stroke, Thyroid Problems, Tuberculosis. Social History Never smoker, Marital Status - Divorced, Alcohol Use - Never, Drug Use - No History, Caffeine Use - Daily. Medical History Respiratory Patient has history of Asthma Cardiovascular Patient has history of Hypertension, Peripheral Venous Disease Musculoskeletal Patient has history of Osteoarthritis Hospitalization/Surgery History - total hip arthroplasty. - elbow arthroscopy with tendon reconstruction. - foot surgery. Objective Constitutional Slightly hypertensive. no acute distress. Vitals Time Taken: 3:02 PM, Height: 63 in, Weight: 210 lbs, BMI: 37.2, Temperature: 97.5 F, Pulse: 62 bpm, Respiratory Rate: 18 breaths/min, Blood Pressure: 140/82 mmHg. Respiratory Normal work of breathing on room air. General Notes: 07/01/2022: The wound on the dorsum of her foot is  smaller with slough and eschar buildup again. Integumentary (Hair, Skin) Wound #1 status is Open. Original cause of wound was Trauma. The date acquired was: 12/24/2021. The wound has been in treatment 6 weeks. The wound is located on the Left,Dorsal Foot. The wound measures 0.1cm length x 0.1cm width x 0.1cm depth; 0.008cm^2 area and 0.001cm^3 volume. There is Fat Layer (Subcutaneous Tissue) exposed. There is no tunneling or undermining noted. There is a medium amount of serous drainage noted. The wound margin is distinct with the outline attached to the wound base. There is small (1-33%) red granulation within the wound bed. There is a large (67-100%) amount of necrotic tissue within the wound bed. The periwound skin appearance had no abnormalities noted for color. The periwound skin appearance exhibited: Scarring, Dry/Scaly. Periwound temperature was noted as No Abnormality. Assessment Active Problems ICD-10 Non-pressure chronic ulcer of other part of left foot with fat layer exposed Venous insufficiency (chronic) (peripheral) [name], [name] ([id number]) [id number] Page 6 of 8 Lymphedema, not elsewhere classified Procedures Wound #1 Pre-procedure diagnosis of Wound #1 is a Lymphedema located on the Left,Dorsal Foot . There was a Selective/Open Wound Non-Viable Tissue Debridement with a total area of 0.01 sq cm performed by Duanne Guess, MD. With the following instrument(s): Curette to remove Non-Viable tissue/material. Material removed includes Eschar and Slough and after achieving pain control using Lidocaine 4% T opical Solution. No specimens were taken. A time out was conducted at 15:34, prior to the start of the procedure. A Minimum amount of bleeding was controlled with Pressure. The procedure was tolerated well with a pain level of 0 throughout and a pain level of 0 following the procedure. Post Debridement Measurements: 0.1cm length x 0.1cm width x  0.1cm depth; 0.001cm^3 volume. Character of Wound/Ulcer Post Debridement is improved. Post procedure Diagnosis Wound #1: Same as Pre-Procedure General Notes: Scribed for Dr Lady Gary by Brenton Grills RN.Marland Kitchen Plan Follow-up Appointments: Return Appointment in 2 weeks. - Dr Lady Gary Anesthetic: (In clinic) Topical Lidocaine 4% applied to wound bed Bathing/ Shower/ Hygiene: May shower with protection but do not get wound dressing(s) wet. Protect dressing(s) with water repellant cover (for example, large plastic bag) or a cast cover and may then take shower. Edema Control - Lymphedema / SCD / Other: Elevate legs to the level of the heart or above for 30 minutes daily and/or when sitting for 3-4 times a day throughout the day. Avoid standing for long periods of time. Patient to wear own compression stockings every day. - to right leg Moisturize legs daily. - Sween lotion WOUND #  1: - Foot Wound Laterality: Dorsal, Left Cleanser: Soap and Water 1 x Per Week/30 Days Discharge Instructions: May shower and wash wound with dial antibacterial soap and water prior to dressing change. Cleanser: Wound Cleanser 1 x Per Week/30 Days Discharge Instructions: Cleanse the wound with wound cleanser prior to applying a clean dressing using gauze sponges, not tissue or cotton balls. Peri-Wound Care: Triamcinolone 15 (g) 1 x Per Week/30 Days Discharge Instructions: Use triamcinolone 15 (g) as directed Peri-Wound Care: Sween Lotion (Moisturizing lotion) (Generic) 1 x Per Week/30 Days Discharge Instructions: Apply moisturizing lotion as directed Prim Dressing: Maxorb Extra Ag+ Alginate Dressing, 2x2 (in/in) 1 x Per Week/30 Days ary Discharge Instructions: Apply to wound bed as instructed Secondary Dressing: Woven Gauze Sponge, Non-Sterile 4x4 in 1 x Per Week/30 Days Discharge Instructions: Apply over primary dressing as directed. Secondary Dressing: Zetuvit Plus Silicone Border Dressing 4x4 (in/in) (Generic) 1 x Per  Week/30 Days Discharge Instructions: Apply silicone border over primary dressing as directed. Secondary Dressing: Zetuvit Plus Silicone Border Dressing 5x5 (in/in) (Generic) 1 x Per Week/30 Days Discharge Instructions: Apply silicone border over primary dressing as directed. Com pression Wrap: Urgo K2, (equivalent to a 4 layer) two layer compression system, regular 1 x Per Week/30 Days Discharge Instructions: Apply Urgo K2 as directed (alternative to 4 layer compression). Com pression Wrap: Unnaboot w/Calamine, 4x10 (in/yd) 1 x Per Week/30 Days Discharge Instructions: Apply Unnaboot as directed. 07/01/2022: Her venous reflux studies were done and were negative. The wound on the dorsum of her foot is smaller with slough and eschar buildup again. I used a curette to debride slough and eschar from the wound. We will continue silver alginate with 4-layer compression equivalent and Unna boot first layer at the top to prevent the wrap from slipping. Follow-up in 2 weeks. Electronic Signature(s) Signed: 07/01/2022 3:48:25 PM By: Duanne Guess MD FACS Entered By: Duanne Guess on 07/01/2022 15:48:25 -------------------------------------------------------------------------------- HxROS Details Patient Name: Date of Service: Krista DGES, Krista [name]. 07/01/2022 2:45 PM [name] ([id number]) [id number] Page 7 of 8 Medical Record Number: [id number] Patient Account Number: [id number] Date of Birth/Sex: Treating RN: 11-29-1945 (77 y.o. F) Primary Care Provider: [place], [place] [facility] Other Clinician: Referring Provider: Treating Provider/Extender: Ramon Dredge in Treatment: 6 Respiratory Medical History: Positive for: Asthma Cardiovascular Medical History: Positive for: Hypertension; Peripheral Venous Disease Musculoskeletal Medical History: Positive for: Osteoarthritis Immunizations Pneumococcal Vaccine: Received Pneumococcal Vaccination:  No Implantable Devices None Hospitalization / Surgery History Type of Hospitalization/Surgery total hip arthroplasty elbow arthroscopy with tendon reconstruction foot surgery Family and Social History Cancer: Yes - Mother; Diabetes: Yes; Heart Disease: Yes; Hereditary Spherocytosis: No; Hypertension: Yes; Kidney Disease: No; Lung Disease: No; Seizures: No; Stroke: No; Thyroid Problems: No; Tuberculosis: No; Never smoker; Marital Status - Divorced; Alcohol Use: Never; Drug Use: No History; Caffeine Use: Daily; Financial Concerns: No; Food, Clothing or Shelter Needs: No; Support System Lacking: No; Transportation Concerns: No Psychologist, prison and probation services) Signed: 07/01/2022 4:05:17 PM By: Duanne Guess MD FACS Entered By: Duanne Guess on 07/01/2022 15:46:38 -------------------------------------------------------------------------------- SuperBill Details Patient Name: Date of Service: Krista DGES, Krista [name]. 07/01/2022 Medical Record Number: [id number] Patient Account Number: [id number] Date of Birth/Sex: Treating RN: [birth year] (77 y.o. [name] Primary Care Provider: [place], [place] [facility] Other Clinician: Referring Provider: Treating Provider/Extender: Ramon Dredge in Treatment: 6 Diagnosis Coding ICD-10 Codes Code Description [phone] Non-pressure chronic ulcer of other part of left foot with fat layer exposed I87.2 Venous insufficiency (  chronic) (peripheral) I89.0 Lymphedema, not elsewhere classified Facility Procedures : [name], [name] Code: [id number] [name] ([id number] ICD-1 L9 Description: 97597 - DEBRIDE WOUND 1ST 20 SQ CM OR < 5) [id number] Diagnosis Description 7.522 Non-pressure chronic ulcer of other part of left foot with fat layer exposed Modifier: [id number] Quantity: 1 1227.pdf Page 8 of 8 Physician Procedures : CPT4 Code Description Modifier [phone] 99213 - WC PHYS LEVEL 3 - EST PT 25 ICD-10 Diagnosis Description L97.522  Non-pressure chronic ulcer of other part of left foot with fat layer exposed I87.2 Venous insufficiency (chronic) (peripheral) I89.0  Lymphedema, not elsewhere classified Quantity: 1 : [id number] 97597 - WC PHYS DEBR WO ANESTH 20 SQ CM ICD-10 Diagnosis Description L97.522 Non-pressure chronic ulcer of other part of left foot with fat layer exposed Quantity: 1 Electronic Signature(s) Signed: 07/01/2022 3:48:52 PM By: Duanne Guess MD FACS Entered By: Duanne Guess on 07/01/2022 15:48:52

## 2022-07-02 NOTE — Progress Notes (Signed)
Krista, [name] ([id number]) [id number] Page 1 of 8 Visit Report for 07/01/2022 Arrival Information Details Patient Name: Date of Service: [place], [name] 07/01/2022 2:45 PM Medical Record Number: [id number] Patient Account Number: [id number] Date of Birth/Sex: Treating RN: 1945-12-30 (77 y.o. [name] Primary Care Tekeshia Klahr: Ilda Basset NG, [place] NCIS Other Clinician: Referring Edker Punt: Treating Caress Reffitt/Extender: Ramon Dredge in Treatment: 6 Visit Information History Since Last Visit All ordered tests and consults were completed: Yes Patient Arrived: Cane Added or deleted any medications: No Arrival Time: 15:01 Any new allergies or adverse reactions: No Accompanied By: self Had a fall or experienced change in No Transfer Assistance: None activities of daily living that may affect Patient Identification Verified: Yes risk of falls: Secondary Verification Process Completed: Yes Signs or symptoms of abuse/neglect since last visito No Patient Requires Transmission-Based Precautions: No Hospitalized since last visit: No Patient Has Alerts: No Implantable device outside of the clinic excluding No cellular tissue based products placed in the center since last visit: Has Dressing in Place as Prescribed: Yes Pain Present Now: No Electronic Signature(s) Signed: 07/02/2022 12:38:14 PM By: Brenton Grills Entered By: Brenton Grills on 07/01/2022 15:02:43 -------------------------------------------------------------------------------- Encounter Discharge Information Details Patient Name: Date of Service: [name], [name] 07/01/2022 2:45 PM Medical Record Number: [id number] Patient Account Number: [id number] Date of Birth/Sex: Treating RN: 1945-08-07 (77 y.o. [name] Primary Care Angelee Bahr: [place] Blas, [place] NCIS Other Clinician: Referring Rubbie Goostree: Treating Ventura Hollenbeck/Extender: Ramon Dredge in Treatment:  6 Encounter Discharge Information Items Post Procedure Vitals Discharge Condition: Stable Temperature (F): 97.5 Ambulatory Status: Cane Pulse (bpm): 65 Discharge Destination: Home Respiratory Rate (breaths/min): 18 Transportation: Private Auto Blood Pressure (mmHg): 152/70 Accompanied By: self Schedule Follow-up Appointment: Yes Clinical Summary of Care: Patient Declined Electronic Signature(s) Signed: 07/02/2022 12:38:14 PM By: Brenton Grills Entered By: Brenton Grills on 07/01/2022 15:39:36 Schweikert, [name] ([id number]) [id number] Page 2 of 8 -------------------------------------------------------------------------------- Lower Extremity Assessment Details Patient Name: Date of Service: [place], [name] 07/01/2022 2:45 PM Medical Record Number: [id number] Patient Account Number: [id number] Date of Birth/Sex: Treating RN: [birth year] (77 y.o. [name] Primary Care Earl Losee: [place] Blas, [place] NCIS Other Clinician: Referring Miryam Mcelhinney: Treating Dareth Andrew/Extender: Ramon Dredge in Treatment: 6 Edema Assessment Assessed: [Left: No] [Right: No] [Left: Edema] [Right: :] Calf Left: Right: Point of Measurement: From Medial Instep 45.1 cm Ankle Left: Right: Point of Measurement: From Medial Instep 24.3 cm Vascular Assessment Pulses: Dorsalis Pedis Palpable: [Left:Yes] Electronic Signature(s) Signed: 07/02/2022 12:38:14 PM By: Brenton Grills Entered By: Brenton Grills on 07/01/2022 15:06:11 -------------------------------------------------------------------------------- Multi Wound Chart Details Patient Name: Date of Service: [name], [name] 07/01/2022 2:45 PM Medical Record Number: [id number] Patient Account Number: [id number] Date of Birth/Sex: Treating RN: 1945-11-01 (77 y.o. F) Primary Care [name]: [place] Blas, [place] NCIS Other Clinician: Referring Calianna Kim: Treating Denelle Capurro/Extender: Ramon Dredge in Treatment: 6 Vital Signs Height(in): 63 Pulse(bpm): 62 Weight(lbs): 210 Blood Pressure(mmHg): 140/82 Body Mass Index(BMI): 37.2 Temperature(F): 97.5 Respiratory Rate(breaths/min): 18 [1:Photos:] [N/A:N/A] Left, Dorsal Foot N/A N/A Wound Location: Trauma N/A N/A Wounding Event: Lymphedema N/A N/A Primary Etiology: Asthma, Hypertension, Peripheral N/A N/A Comorbid History: Venous Disease, Osteoarthritis 12/24/2021 N/A N/A Date Acquired: 6 N/A N/A Weeks of Treatment: Open N/A N/A Wound Status: No N/A N/A Wound Recurrence: 0.1x0.1x0.1 N/A N/A Measurements L x W x D (cm) 0.008 N/A N/A A (cm) : rea 0.001 N/A N/A Volume (cm) : 98.80% N/A  N/A % Reduction in A rea: 99.20% N/A N/A % Reduction in Volume: Full Thickness Without Exposed N/A N/A Classification: Support Structures Medium N/A N/A Exudate A mount: Serous N/A N/A Exudate Type: amber N/A N/A Exudate Color: Distinct, outline attached N/A N/A Wound Margin: Small (1-33%) N/A N/A Granulation A mount: Red N/A N/A Granulation Quality: Large (67-100%) N/A N/A Necrotic A mount: Fat Layer (Subcutaneous Tissue): Yes N/A N/A Exposed Structures: Fascia: No Tendon: No Muscle: No Joint: No Bone: No Small (1-33%) N/A N/A Epithelialization: Debridement - Selective/Open Wound N/A N/A Debridement: Pre-procedure Verification/Time Out 15:34 N/A N/A Taken: Lidocaine 4% Topical Solution N/A N/A Pain Control: Slough N/A N/A Tissue Debrided: Non-Viable Tissue N/A N/A Level: 0.01 N/A N/A Debridement A (sq cm): rea Curette N/A N/A Instrument: Minimum N/A N/A Bleeding: Pressure N/A N/A Hemostasis A chieved: 0 N/A N/A Procedural Pain: 0 N/A N/A Post Procedural Pain: Procedure was tolerated well N/A N/A Debridement Treatment Response: 0.1x0.1x0.1 N/A N/A Post Debridement Measurements L x W x D (cm) 0.001 N/A N/A Post Debridement Volume: (cm) Scarring: Yes N/A N/A Periwound Skin  Texture: Dry/Scaly: Yes N/A N/A Periwound Skin Moisture: No Abnormalities Noted N/A N/A Periwound Skin Color: No Abnormality N/A N/A Temperature: Debridement N/A N/A Procedures Performed: Treatment Notes Wound #1 (Foot) Wound Laterality: Dorsal, Left Cleanser Soap and Water Discharge Instruction: May shower and wash wound with dial antibacterial soap and water prior to dressing change. Wound Cleanser Discharge Instruction: Cleanse the wound with wound cleanser prior to applying a clean dressing using gauze sponges, not tissue or cotton balls. Peri-Wound Care Triamcinolone 15 (g) Discharge Instruction: Use triamcinolone 15 (g) as directed Sween Lotion (Moisturizing lotion) Discharge Instruction: Apply moisturizing lotion as directed Topical Primary Dressing Maxorb Extra Ag+ Alginate Dressing, 2x2 (in/in) Discharge Instruction: Apply to wound bed as instructed Secondary Dressing Woven Gauze Sponge, Non-Sterile 4x4 in Discharge Instruction: Apply over primary dressing as directed. Zetuvit Plus Silicone Border Dressing 4x4 (in/in) Discharge Instruction: Apply silicone border over primary dressing as directed. DEJANIRA, PAMINTUAN ([id number]) [id number] Page 4 of 8 Zetuvit Plus Silicone Border Dressing 5x5 (in/in) Discharge Instruction: Apply silicone border over primary dressing as directed. Secured With Compression Wrap Urgo K2, (equivalent to a 4 layer) two layer compression system, regular Discharge Instruction: Apply Urgo K2 as directed (alternative to 4 layer compression). Unnaboot w/Calamine, 4x10 (in/yd) Discharge Instruction: Apply Unnaboot as directed. Compression Stockings Add-Ons Electronic Signature(s) Signed: 07/01/2022 3:40:49 PM By: Duanne Guess MD FACS Entered By: Duanne Guess on 07/01/2022 15:40:49 -------------------------------------------------------------------------------- Multi-Disciplinary Care Plan Details Patient  Name: Date of Service: [name], [name] 07/01/2022 2:45 PM Medical Record Number: [id number] Patient Account Number: [id number] Date of Birth/Sex: Treating RN: 06-08-1945 (77 y.o. [name] Primary Care Cristofher Livecchi:  Blas, [place] NCIS Other Clinician: Referring Astin Sayre: Treating Lovinia Snare/Extender: Ramon Dredge in Treatment: 6 Active Inactive Orientation to the Wound Care Program Nursing Diagnoses: Knowledge deficit related to the wound healing center program Goals: Patient/caregiver will verbalize understanding of the Wound Healing Center Program Date Initiated: 05/20/2022 Target Resolution Date: 06/12/2022 Goal Status: Active Interventions: Provide education on orientation to the wound center Notes: Wound/Skin Impairment Nursing Diagnoses: Impaired tissue integrity Knowledge deficit related to ulceration/compromised skin integrity Goals: Patient/caregiver will verbalize understanding of skin care regimen Date Initiated: 05/20/2022 Target Resolution Date: 07/10/2022 Goal Status: Active Interventions: Assess ulceration(s) every visit Treatment Activities: Skin care regimen initiated : 05/20/2022 Topical wound management initiated : 05/20/2022 Notes: ZAMAYA, RAPAPORT ([id number]) [id number] Page 5 of 8 Electronic  Signature(s) Signed: 07/02/2022 12:38:14 PM By: Brenton Grills Entered By: Brenton Grills on 07/01/2022 15:13:38 -------------------------------------------------------------------------------- Pain Assessment Details Patient Name: Date of Service: [name], [name] 07/01/2022 2:45 PM Medical Record Number: [id number] Patient Account Number: [id number] Date of Birth/Sex: Treating RN: August 31, 1945 (77 y.o. [name] Primary Care Vada Yellen: [place] Blas, [place] NCIS Other Clinician: Referring Akela Pocius: Treating Rigby Leonhardt/Extender: Ramon Dredge in Treatment: 6 Active Problems Location of Pain  Severity and Description of Pain Patient Has Paino No Site Locations Pain Management and Medication Current Pain Management: Electronic Signature(s) Signed: 07/02/2022 12:38:14 PM By: Brenton Grills Entered By: Brenton Grills on 07/01/2022 15:05:45 -------------------------------------------------------------------------------- Patient/Caregiver Education Details Patient Name: Date of Service: [name], [name] Salena Saner 6/26/2024andnbsp2:45 PM Medical Record Number: [id number] Patient Account Number: [id number] Date of Birth/Gender: Treating RN: 1945-02-25 (77 y.o. [name] Primary Care Physician: [place] Blas, [place] NCIS Other Clinician: Referring Physician: Treating Physician/Extender: Ramon Dredge in Treatment: 6 Education Assessment Education Provided To: Patient AMBERA, FEDELE ([id number]) [id number] Page 6 of 8 Education Topics Provided Wound/Skin Impairment: Methods: Explain/Verbal Responses: State content correctly Electronic Signature(s) Signed: 07/02/2022 12:38:14 PM By: Brenton Grills Entered By: Brenton Grills on 07/01/2022 15:13:56 -------------------------------------------------------------------------------- Wound Assessment Details Patient Name: Date of Service: [name], [name] 07/01/2022 2:45 PM Medical Record Number: [id number] Patient Account Number: [id number] Date of Birth/Sex: Treating RN: 08/13/1945 (77 y.o. [name] Primary Care Karris Deangelo: Ilda Basset NG, [place] Other Clinician: Referring Infinity Jeffords: Treating Patrizia Paule/Extender: Ramon Dredge in Treatment: 6 Wound Status Wound Number: 1 Primary Lymphedema Etiology: Wound Location: Left, Dorsal Foot Wound Status: Open Wounding Event: Trauma Comorbid Asthma, Hypertension, Peripheral Venous Disease, Date Acquired: 12/24/2021 History: Osteoarthritis Weeks Of Treatment: 6 Clustered Wound: No Photos Wound Measurements Length:  (cm) 0.1 Width: (cm) 0.1 Depth: (cm) 0.1 Area: (cm) 0.008 Volume: (cm) 0.001 % Reduction in Area: 98.8% % Reduction in Volume: 99.2% Epithelialization: Small (1-33%) Tunneling: No Undermining: No Wound Description Classification: Full Thickness Without Exposed Suppor Wound Margin: Distinct, outline attached Exudate Amount: Medium Exudate Type: Serous Exudate Color: amber t Structures Foul Odor After Cleansing: No Slough/Fibrino No Wound Bed Granulation Amount: Small (1-33%) Exposed Structure Granulation Quality: Red Fascia Exposed: No Necrotic Amount: Large (67-100%) Fat Layer (Subcutaneous Tissue) Exposed: Yes Tendon Exposed: No Muscle Exposed: No Joint Exposed: No Bone Exposed: No Salih, Aila C ([id number]) [id number] Page 7 of 8 Periwound Skin Texture Texture Color No Abnormalities Noted: No No Abnormalities Noted: Yes Scarring: Yes Temperature / Pain Temperature: No Abnormality Moisture No Abnormalities Noted: No Dry / Scaly: Yes Treatment Notes Wound #1 (Foot) Wound Laterality: Dorsal, Left Cleanser Soap and Water Discharge Instruction: May shower and wash wound with dial antibacterial soap and water prior to dressing change. Wound Cleanser Discharge Instruction: Cleanse the wound with wound cleanser prior to applying a clean dressing using gauze sponges, not tissue or cotton balls. Peri-Wound Care Triamcinolone 15 (g) Discharge Instruction: Use triamcinolone 15 (g) as directed Sween Lotion (Moisturizing lotion) Discharge Instruction: Apply moisturizing lotion as directed Topical Primary Dressing Maxorb Extra Ag+ Alginate Dressing, 2x2 (in/in) Discharge Instruction: Apply to wound bed as instructed Secondary Dressing Woven Gauze Sponge, Non-Sterile 4x4 in Discharge Instruction: Apply over primary dressing as directed. Zetuvit Plus Silicone Border Dressing 4x4 (in/in) Discharge Instruction: Apply silicone border over primary  dressing as directed. Zetuvit Plus Silicone Border Dressing 5x5 (in/in) Discharge Instruction: Apply silicone border over primary dressing as directed. Secured With Compression Wrap Urgo K2, (equivalent  to a 4 layer) two layer compression system, regular Discharge Instruction: Apply Urgo K2 as directed (alternative to 4 layer compression). Unnaboot w/Calamine, 4x10 (in/yd) Discharge Instruction: Apply Unnaboot as directed. Compression Stockings Add-Ons Electronic Signature(s) Signed: 07/02/2022 12:38:14 PM By: Brenton Grills Entered By: Brenton Grills on 07/01/2022 15:12:39 -------------------------------------------------------------------------------- Vitals Details Patient Name: Date of Service: [name], [name] 07/01/2022 2:45 PM Medical Record Number: [id number] Patient Account Number: [id number] Date of Birth/Sex: Treating RN: 04-Nov-1945 (77 y.o. [name] Primary Care Avrian Delfavero: [place] Blas, [place] NCIS Other Clinician: Referring Omarian Jaquith: Treating Champ Keetch/Extender: Ramon Dredge in Treatment: 6 Vital Signs CRISTOL, ENGDAHL ([id number]) [id number] Page 8 of 8 Time Taken: 15:02 Temperature (F): 97.5 Height (in): 63 Pulse (bpm): 62 Weight (lbs): 210 Respiratory Rate (breaths/min): 18 Body Mass Index (BMI): 37.2 Blood Pressure (mmHg): 140/82 Reference Range: 80 - 120 mg / dl Electronic Signature(s) Signed: 07/02/2022 12:38:14 PM By: Brenton Grills Entered By: Brenton Grills on 07/01/2022 15:05:34

## 2022-07-06 NOTE — Progress Notes (Signed)
TROY, TUNICK ([id number]) [id number] Page 1 of 9 Visit Report for 05/20/2022 Chief Complaint Document Details Patient Name: Date of Service: [place], [name]. 05/20/2022 1:30 PM Medical Record Number: [id number] Patient Account Number: [id number] Date of Birth/Sex: Treating RN: [birth year] (77 y.o. F) Primary Care Provider: [place] Blas, [place] [facility] Other Clinician: Referring Provider: Treating Provider/Extender: Ramon Dredge in Treatment: 0 Information Obtained from: Patient Chief Complaint Patient seen for complaints of Non-Healing Wound. Electronic Signature(s) Signed: 05/20/2022 2:13:44 PM By: Duanne Guess MD FACS Previous Signature: 05/20/2022 1:23:17 PM Version By: Duanne Guess MD FACS Entered By: Duanne Guess on 05/20/2022 14:13:44 -------------------------------------------------------------------------------- Debridement Details Patient Name: Date of Service: [name], [name]. 05/20/2022 1:30 PM Medical Record Number: [id number] Patient Account Number: [id number] Date of Birth/Sex: Treating RN: 1945-04-12 (77 y.o. [name] Primary Care Provider: [place], [place] [facility] Other Clinician: Referring Provider: Treating Provider/Extender: Ramon Dredge in Treatment: 0 Debridement Performed for Assessment: Wound #1 Left,Dorsal Foot Performed By: Physician Duanne Guess, MD Debridement Type: Debridement Level of Consciousness (Pre-procedure): Awake and Alert Pre-procedure Verification/Time Out Yes - 13:46 Taken: Start Time: 13:46 Pain Control: Lidocaine 4% Topical Solution Percent of Wound Bed Debrided: 100% T Area Debrided (cm): otal 0.66 Tissue and other material debrided: Non-Viable, Eschar, Slough, Slough Level: Non-Viable Tissue Debridement Description: Selective/Open Wound Instrument: Curette Bleeding: Minimum Hemostasis Achieved: Pressure Response to Treatment: Procedure was  tolerated well Level of Consciousness (Post- Awake and Alert procedure): Post Debridement Measurements of Total Wound Length: (cm) 1.2 Width: (cm) 0.7 Depth: (cm) 0.2 Volume: (cm) 0.132 Character of Wound/Ulcer Post Debridement: Improved Post Procedure Diagnosis Same as Pre-procedure [name], [name] ([id number]) [id number] Page 2 of 9 Notes [name], RN Electronic Signature(s) Signed: 05/20/2022 4:01:39 PM By: Samuella Bruin Signed: 05/20/2022 4:09:43 PM By: Duanne Guess MD FACS Entered By: Samuella Bruin on 05/20/2022 13:57:59 -------------------------------------------------------------------------------- HPI Details Patient Name: Date of Service: [name], [name]. 05/20/2022 1:30 PM Medical Record Number: [id number] Patient Account Number: [id number] Date of Birth/Sex: Treating RN: Aug 19, 1945 (77 y.o. F) Primary Care Provider: [place] [facility], [place] [facility] Other Clinician: Referring Provider: Treating Provider/Extender: Ramon Dredge in Treatment: 0 History of Present Illness HPI Description: ADMISSION 05/20/2022 This is a 77 year old woman with a chronic wound on her left foot. She has a remote history of trauma status post surgery and grafting many years ago. She reports that her dog stepped on her foot in December and the wound opened at that time. She was initially treated by her primary care doctor with mupirocin and a 10-day course of Bactrim for suspected cellulitis, back in February of this year. She is not diabetic and does not smoke. She was seen by Dr. Lajoyce Corners on March 26, 2022 and diagnosed with stasis dermatitis and chronic venous insufficiency. He prescribed compression stockings. His note makes no mention of the wound. She has not been wearing the compression stockings because she finds them difficult to apply. She reports wearing compression leggings, but is not wearing them today  and cannot tell me what degree of compression they provide. Electronic Signature(s) Signed: 05/20/2022 2:14:54 PM By: Duanne Guess MD FACS Previous Signature: 05/20/2022 1:27:29 PM Version By: Duanne Guess MD FACS Entered By: Duanne Guess on 05/20/2022 14:14:54 -------------------------------------------------------------------------------- Physical Exam Details Patient Name: Date of Service: [name], [name]. 05/20/2022 1:30 PM Medical Record Number: [id number] Patient Account Number: [id number] Date of Birth/Sex: Treating RN: 1945/10/14 (  77 y.o. F) Primary Care Provider: [place] Blas, [place] [facility] Other Clinician: Referring Provider: Treating Provider/Extender: Ramon Dredge in Treatment: 0 Constitutional Slightly hypertensive. . . . No acute distress. Respiratory Normal work of breathing on room air. Cardiovascular . 2+ pitting edema bilaterally. Changes consistent with stasis dermatitis as well as chronic lymphedema with skin thickening and hyperkeratosis.. Notes 05/20/2022: On the dorsal aspect of her left foot, she has an small irregular opening that has dog hair and slough along with eschar crusted into it. There are deformities consistent with her prior history of trauma. Electronic Signature(s) [name], [name] ([id number]) [id number] Page 3 of 9 Signed: 05/20/2022 2:17:27 PM By: Duanne Guess MD FACS Entered By: Duanne Guess on 05/20/2022 14:17:26 -------------------------------------------------------------------------------- Physician Orders Details Patient Name: Date of Service: [name], [name]. 05/20/2022 1:30 PM Medical Record Number: [id number] Patient Account Number: [id number] Date of Birth/Sex: Treating RN: 10/14/1945 (77 y.o. [name] Primary Care Provider: [place] [facility], [place] [facility] Other Clinician: Referring Provider: Treating Provider/Extender: Ramon Dredge in Treatment:  0 Verbal / Phone Orders: No Diagnosis Coding ICD-10 Coding Code Description [phone] Non-pressure chronic ulcer of other part of left foot with fat layer exposed I87.2 Venous insufficiency (chronic) (peripheral) Follow-up Appointments ppointment in 1 week. - Dr. Lady Gary - room 2 Return A Anesthetic (In clinic) Topical Lidocaine 4% applied to wound bed Bathing/ Shower/ Hygiene May shower with protection but do not get wound dressing(s) wet. Protect dressing(s) with water repellant cover (for example, large plastic bag) or a cast cover and may then take shower. Edema Control - Lymphedema / SCD / Other Elevate legs to the level of the heart or above for 30 minutes daily and/or when sitting for 3-4 times a day throughout the day. Avoid standing for long periods of time. Patient to wear own compression stockings every day. - to right leg Wound Treatment Wound #1 - Foot Wound Laterality: Dorsal, Left Cleanser: Soap and Water 1 x Per Week/30 Days Discharge Instructions: May shower and wash wound with dial antibacterial soap and water prior to dressing change. Cleanser: Wound Cleanser 1 x Per Week/30 Days Discharge Instructions: Cleanse the wound with wound cleanser prior to applying a clean dressing using gauze sponges, not tissue or cotton balls. Peri-Wound Care: Triamcinolone 15 (g) 1 x Per Week/30 Days Discharge Instructions: Use triamcinolone 15 (g) as directed Peri-Wound Care: Sween Lotion (Moisturizing lotion) 1 x Per Week/30 Days Discharge Instructions: Apply moisturizing lotion as directed Prim Dressing: Maxorb Extra Ag+ Alginate Dressing, 2x2 (in/in) 1 x Per Week/30 Days ary Discharge Instructions: Apply to wound bed as instructed Secondary Dressing: Woven Gauze Sponge, Non-Sterile 4x4 in 1 x Per Week/30 Days Discharge Instructions: Apply over primary dressing as directed. Compression Wrap: Urgo K2, (equivalent to a 4 layer) two layer compression system, regular 1 x Per Week/30  Days Discharge Instructions: Apply Urgo K2 as directed (alternative to 4 layer compression). Compression Wrap: Unnaboot w/Calamine, 4x10 (in/yd) 1 x Per Week/30 Days Discharge Instructions: Apply Unnaboot as directed. [facility] - referral for venous reflux studies - (ICD10 I87.2 - Venous insufficiency (chronic) (peripheral)) Patient Medications [place], Renna [name] ([id number]) [id number] Page 4 of 9 llergies: morphine, codeine, tetracycline, Keflex A Notifications Medication Indication Start End 05/20/2022 lidocaine DOSE topical 4 % cream - cream topical Electronic Signature(s) Signed: 05/20/2022 4:09:43 PM By: Duanne Guess MD FACS Entered By: Duanne Guess on 05/20/2022 14:19:43 -------------------------------------------------------------------------------- Problem List Details Patient Name: Date of Service: [name],  [name]. 05/20/2022 1:30 PM Medical Record Number: [id number] Patient Account Number: [id number] Date of Birth/Sex: Treating RN: 12/24/1945 (77 y.o. F) Primary Care Provider: [place] Bl[place], [place] [facility] Other Clinician: Referring Provider: Treating Provider/Extender: Ramon Dredge in Treatment: 0 Active Problems ICD-10 Encounter Code Description Active Date MDM Diagnosis L97.522 Non-pressure chronic ulcer of other part of left foot with fat layer exposed 05/20/2022 No Yes I87.2 Venous insufficiency (chronic) (peripheral) 05/20/2022 No Yes I89.0 Lymphedema, not elsewhere classified 05/20/2022 No Yes Inactive Problems Resolved Problems Electronic Signature(s) Signed: 05/20/2022 2:13:27 PM By: Duanne Guess MD FACS Previous Signature: 05/20/2022 1:22:39 PM Version By: Duanne Guess MD FACS Entered By: Duanne Guess on 05/20/2022 14:13:26 -------------------------------------------------------------------------------- Progress Note Details Patient Name: Date of Service: [name], [name]  [name]. 05/20/2022 1:30 PM Medical Record Number: [id number] Patient Account Number: [id number] Date of Birth/Sex: Treating RN: 23-May-1945 (77 y.o. F) Primary Care Provider: [place] Blas, [place] [facility] Other Clinician: Referring Provider: Treating Provider/Extender: Ramon Dredge in Treatment: 0 Subjective [name], [name] ([id number]) [id number] Page 5 of 9 Chief Complaint Information obtained from Patient Patient seen for complaints of Non-Healing Wound. History of Present Illness (HPI) ADMISSION 05/20/2022 This is a 77 year old woman with a chronic wound on her left foot. She has a remote history of trauma status post surgery and grafting many years ago. She reports that her dog stepped on her foot in December and the wound opened at that time. She was initially treated by her primary care doctor with mupirocin and a 10-day course of Bactrim for suspected cellulitis, back in February of this year. She is not diabetic and does not smoke. She was seen by Dr. Lajoyce Corners on March 26, 2022 and diagnosed with stasis dermatitis and chronic venous insufficiency. He prescribed compression stockings. His note makes no mention of the wound. She has not been wearing the compression stockings because she finds them difficult to apply. She reports wearing compression leggings, but is not wearing them today and cannot tell me what degree of compression they provide. Patient History Allergies morphine, codeine, tetracycline, Keflex Family History Cancer - Mother, Diabetes, Heart Disease, Hypertension, No family history of Hereditary Spherocytosis, Kidney Disease, Lung Disease, Seizures, Stroke, Thyroid Problems, Tuberculosis. Social History Never smoker, Marital Status - Divorced, Alcohol Use - Never, Drug Use - No History, Caffeine Use - Daily. Medical History Respiratory Patient has history of Asthma Cardiovascular Patient has history of Hypertension, Peripheral  Venous Disease Musculoskeletal Patient has history of Osteoarthritis Hospitalization/Surgery History - total hip arthroplasty. - elbow arthroscopy with tendon reconstruction. - foot surgery. Review of Systems (ROS) Constitutional Symptoms (General Health) Denies complaints or symptoms of Fatigue, Fever, Chills, Marked Weight Change. Eyes Complains or has symptoms of Glasses / Contacts. Ear/Nose/Mouth/Throat Denies complaints or symptoms of Chronic sinus problems or rhinitis. Gastrointestinal Denies complaints or symptoms of Frequent diarrhea, Nausea, Vomiting. Endocrine Denies complaints or symptoms of Heat/cold intolerance. Genitourinary Denies complaints or symptoms of Frequent urination. Integumentary (Skin) Denies complaints or symptoms of Wounds. Neurologic Denies complaints or symptoms of Numbness/parasthesias. Psychiatric Denies complaints or symptoms of Claustrophobia. Objective Constitutional Slightly hypertensive. No acute distress. Vitals Time Taken: 1:25 PM, Height: 63 in, Source: Stated, Temperature: 98.1 F, Pulse: 73 bpm, Respiratory Rate: 16 breaths/min, Blood Pressure: 147/75 mmHg. Respiratory Normal work of breathing on room air. Cardiovascular 2+ pitting edema bilaterally. Changes consistent with stasis dermatitis as well as chronic lymphedema with skin thickening and hyperkeratosis.. General Notes: 05/20/2022: On the dorsal aspect of her left  foot, she has an small irregular opening that has dog hair and slough along with eschar crusted into it. There are deformities consistent with her prior history of trauma. Integumentary (Hair, Skin) Wound #1 status is Open. Original cause of wound was Trauma. The date acquired was: 12/24/2021. The wound is located on the Left,Dorsal Foot. The wound measures 1.2cm length x 0.7cm width x 0.2cm depth; 0.66cm^2 area and 0.132cm^3 volume. There is Fat Layer (Subcutaneous Tissue) exposed. There is no tunneling or undermining  noted. There is a medium amount of serous drainage noted. The wound margin is distinct with the outline attached to the wound base. There is small (1-33%) red granulation within the wound bed. There is a large (67-100%) amount of necrotic tissue within the wound bed including Adherent Slough. The periwound skin appearance had no abnormalities noted for color. The periwound skin appearance exhibited: Scarring, Dry/Scaly. [name], [name] ([id number]) [id number] Page 6 of 9 temperature was noted as No Abnormality. Assessment Active Problems ICD-10 Non-pressure chronic ulcer of other part of left foot with fat layer exposed Venous insufficiency (chronic) (peripheral) Lymphedema, not elsewhere classified Procedures Wound #1 Pre-procedure diagnosis of Wound #1 is a Lymphedema located on the Left,Dorsal Foot . There was a Selective/Open Wound Non-Viable Tissue Debridement with a total area of 0.66 sq cm performed by Duanne Guess, MD. With the following instrument(s): Curette to remove Non-Viable tissue/material. Material removed includes Eschar and Slough and after achieving pain control using Lidocaine 4% T opical Solution. No specimens were taken. A time out was conducted at 13:46, prior to the start of the procedure. A Minimum amount of bleeding was controlled with Pressure. The procedure was tolerated well. Post Debridement Measurements: 1.2cm length x 0.7cm width x 0.2cm depth; 0.132cm^3 volume. Character of Wound/Ulcer Post Debridement is improved. Post procedure Diagnosis Wound #1: Same as Pre-Procedure General Notes: [name], RN. Pre-procedure diagnosis of Wound #1 is a Lymphedema located on the Left,Dorsal Foot . There was a Double Layer Compression Therapy Procedure by Samuella Bruin, RN. Post procedure Diagnosis Wound #1: Same as Pre-Procedure Plan Follow-up Appointments: Return Appointment in 1 week. -  Dr. Lady Gary - room 2 Anesthetic: (In clinic) Topical Lidocaine 4% applied to wound bed Bathing/ Shower/ Hygiene: May shower with protection but do not get wound dressing(s) wet. Protect dressing(s) with water repellant cover (for example, large plastic bag) or a cast cover and may then take shower. Edema Control - Lymphedema / SCD / Other: Elevate legs to the level of the heart or above for 30 minutes daily and/or when sitting for 3-4 times a day throughout the day. Avoid standing for long periods of time. Patient to wear own compression stockings every day. - to right leg ordered were: [facility] - referral for venous reflux studies The following medication(s) was prescribed: lidocaine topical 4 % cream cream topical was prescribed at facility WOUND #1: - Foot Wound Laterality: Dorsal, Left Cleanser: Soap and Water 1 x Per Week/30 Days Discharge Instructions: May shower and wash wound with dial antibacterial soap and water prior to dressing change. Cleanser: Wound Cleanser 1 x Per Week/30 Days Discharge Instructions: Cleanse the wound with wound cleanser prior to applying a clean dressing using gauze sponges, not tissue or cotton balls. Peri-Wound Care: Triamcinolone 15 (g) 1 x Per Week/30 Days Discharge Instructions: Use triamcinolone 15 (g) as directed Peri-Wound Care: Sween Lotion (Moisturizing lotion) 1 x Per Week/30 Days Discharge Instructions: Apply moisturizing lotion as directed Prim  Dressing: Maxorb Extra Ag+ Alginate Dressing, 2x2 (in/in) 1 x Per Week/30 Days ary Discharge Instructions: Apply to wound bed as instructed Secondary Dressing: Woven Gauze Sponge, Non-Sterile 4x4 in 1 x Per Week/30 Days Discharge Instructions: Apply over primary dressing as directed. Com pression Wrap: Urgo K2, (equivalent to a 4 layer) two layer compression system, regular 1 x Per Week/30 Days Discharge Instructions: Apply Urgo K2 as directed (alternative to 4 layer compression). Com pression  Wrap: Unnaboot w/Calamine, 4x10 (in/yd) 1 x Per Week/30 Days Discharge Instructions: Apply Unnaboot as directed. 05/20/2022: This is a 77 year old woman with what appears to be chronic lymphedema as well as likely venous insufficiency. She has a wound on the top of her foot that is in the same location where she suffered a trauma many years ago. The wound opened in December and has not healed. On the dorsal aspect of her left foot, she has an small irregular opening that has dog hair and slough along with eschar crusted into it. There are deformities consistent with her prior history of trauma. I used a curette to debride slough and eschar from the wound. Will apply silver alginate to the site and 4-layer compression equivalent. We will send her for venous reflux studies. We will also have her bring in her compression leggings so that I can gauge whether or not they are adequate for her needs; if they are not, we will work on getting her some juxta lite stockings or Farrow wraps to apply adequate compression to manage her swelling. Follow-up in 1 week. [name], [name] ([id number]) [id number] Page 7 of 9 Electronic Signature(s) Signed: 07/06/2022 2:50:14 PM By: Pearletha Alfred Signed: 07/06/2022 2:55:18 PM By: Duanne Guess MD FACS Previous Signature: 05/20/2022 2:22:59 PM Version By: Duanne Guess MD FACS Previous Signature: 05/20/2022 2:20:16 PM Version By: Duanne Guess MD FACS Entered By: Pearletha Alfred on 07/06/2022 14:50:14 -------------------------------------------------------------------------------- HxROS Details Patient Name: Date of Service: [name], [name]. 05/20/2022 1:30 PM Medical Record Number: [id number] Patient Account Number: [id number] Date of Birth/Sex: Treating RN: Nov 15, 1945 (77 y.o. [name] Primary Care Provider: [place] Blas, [place] [facility] Other Clinician: Referring Provider: Treating Provider/Extender: Ramon Dredge in Treatment: 0 Constitutional Symptoms (General Health) Complaints and Symptoms: Negative for: Fatigue; Fever; Chills; Marked Weight Change Eyes Complaints and Symptoms: Positive for: Glasses / Contacts Ear/Nose/Mouth/Throat Complaints and Symptoms: Negative for: Chronic sinus problems or rhinitis Gastrointestinal Complaints and Symptoms: Negative for: Frequent diarrhea; Nausea; Vomiting Endocrine Complaints and Symptoms: Negative for: Heat/cold intolerance Genitourinary Complaints and Symptoms: Negative for: Frequent urination Integumentary (Skin) Complaints and Symptoms: Negative for: Wounds Neurologic Complaints and Symptoms: Negative for: Numbness/parasthesias Psychiatric Complaints and Symptoms: Negative for: Claustrophobia Hematologic/Lymphatic Respiratory Medical History: Positive for: Asthma [name], [name] ([id number]) [id number] Page 8 of 9 Cardiovascular Medical History: Positive for: Hypertension; Peripheral Venous Disease Immunological Musculoskeletal Medical History: Positive for: Osteoarthritis Oncologic Immunizations Pneumococcal Vaccine: Received Pneumococcal Vaccination: No Implantable Devices None Hospitalization / Surgery History Type of Hospitalization/Surgery total hip arthroplasty elbow arthroscopy with tendon reconstruction foot surgery Family and Social History Cancer: Yes - Mother; Diabetes: Yes; Heart Disease: Yes; Hereditary Spherocytosis: No; Hypertension: Yes; Kidney Disease: No; Lung Disease: No; Seizures: No; Stroke: No; Thyroid Problems: No; Tuberculosis: No; Never smoker; Marital Status - Divorced; Alcohol Use: Never; Drug Use: No History; Caffeine Use: Daily; Financial Concerns: No; Food, Clothing or Shelter Needs: No; Support System Lacking: No; Transportation Concerns: No Electronic Signature(s) Signed: 05/20/2022 4:01:39 PM By: Samuella Bruin Signed: 05/20/2022 4:09:43 PM  By:  Duanne Guess MD FACS Entered By: Samuella Bruin on 05/20/2022 13:27:07 -------------------------------------------------------------------------------- SuperBill Details Patient Name: Date of Service: [name], [name]. 05/20/2022 Medical Record Number: [id number] Patient Account Number: [id number] Date of Birth/Sex: Treating RN: 02-Dec-1945 (77 y.o. F) Primary Care Provider: [place], [place] [facility] Other Clinician: Referring Provider: Treating Provider/Extender: Ramon Dredge in Treatment: 0 Diagnosis Coding ICD-10 Codes Code Description [phone] Non-pressure chronic ulcer of other part of left foot with fat layer exposed I87.2 Venous insufficiency (chronic) (peripheral) I89.0 Lymphedema, not elsewhere classified Facility Procedures : 7 CPT4 Code: [id number] Description: 99213 - WOUND CARE VISIT-LEV 3 EST PT Modifier: 25 Quantity: 1 Physician Procedures : CPT4 Code Description Modifier [id number] - WC PHYS LEVEL 4 - NEW PT 25 ICD-10 Diagnosis Description L97.522 Non-pressure chronic ulcer of other part of left foot with fat layer exposed I87.2 Venous insufficiency (chronic) (peripheral) I89.0  Lymphedema, not elsewhere classified Quantity: 1 : [id number] - WC PHYS DEBR WO ANESTH 20 SQ CM ICD-10 Diagnosis Description L97.522 Non-pressure chronic ulcer of other part of left foot with fat layer exposed Quantity: 1 Electronic Signature(s) Signed: 05/20/2022 4:01:39 PM By: Samuella Bruin Signed: 05/20/2022 4:09:43 PM By: Duanne Guess MD FACS Previous Signature: 05/20/2022 2:23:21 PM Version By: Duanne Guess MD FACS Entered By: Samuella Bruin on 05/20/2022 14:33:06

## 2022-07-06 NOTE — Progress Notes (Signed)
BRYNDA, DEWATER ([id number]) [id number] Page 1 of 8 Visit Report for 05/29/2022 Chief Complaint Document Details Patient Name: Date of Service: [place], [name] 05/29/2022 12:30 PM Medical Record Number: [id number] Patient Account Number: [id number] Date of Birth/Sex: Treating RN: January 02, 1946 (77 y.o. F) Primary Care Provider: [place] Blas, [place] NCIS Other Clinician: Referring Provider: Treating Provider/Extender: Ramon Dredge in Treatment: 1 Information Obtained from: Patient Chief Complaint Patient seen for complaints of Non-Healing Wound. Electronic Signature(s) Signed: 05/29/2022 1:24:09 PM By: Duanne Guess MD FACS Entered By: Duanne Guess on 05/29/2022 13:24:09 -------------------------------------------------------------------------------- Debridement Details Patient Name: Date of Service: [name], [place] [name]. 05/29/2022 12:30 PM Medical Record Number: [id number] Patient Account Number: [id number] Date of Birth/Sex: Treating RN: 1945/05/01 (77 y.o. [name] Primary Care Provider: Ilda Basset NG, [place] [facility]IS Other Clinician: Referring Provider: Treating Provider/Extender: Ramon Dredge in Treatment: 1 Debridement Performed for Assessment: Wound #1 Left,Dorsal Foot Performed By: Physician Duanne Guess, MD Debridement Type: Debridement Level of Consciousness (Pre-procedure): Awake and Alert Pre-procedure Verification/Time Out Yes - 13:15 Taken: Start Time: 13:16 Pain Control: Lidocaine 4% T opical Solution Percent of Wound Bed Debrided: 100% T Area Debrided (cm): otal 0.01 Tissue and other material debrided: Non-Viable, Slough, Slough Level: Non-Viable Tissue Debridement Description: Selective/Open Wound Instrument: Curette Bleeding: Minimum Hemostasis Achieved: Pressure End Time: 13:18 Procedural Pain: 0 Post Procedural Pain: 0 Response to Treatment: Procedure was tolerated well Level of  Consciousness (Post- Awake and Alert procedure): Post Debridement Measurements of Total Wound Length: (cm) 0.1 Width: (cm) 0.1 Depth: (cm) 0.1 Volume: (cm) 0.001 Character of Wound/Ulcer Post Debridement: Improved ORRIE, RAVELO [name] ([id number]) [id number] Page 2 of 8 Post Procedure Diagnosis Same as Pre-procedure Notes scribed for Dr. Lady Gary by Brenton Grills, RN Electronic Signature(s) Signed: 05/29/2022 2:18:57 PM By: Duanne Guess MD FACS Signed: 06/03/2022 10:30:00 AM By: Brenton Grills Entered By: Brenton Grills on 05/29/2022 13:19:52 -------------------------------------------------------------------------------- HPI Details Patient Name: Date of Service: [name], PA [name]. 05/29/2022 12:30 PM Medical Record Number: [id number] Patient Account Number: [id number] Date of Birth/Sex: Treating RN: [birth year] (77 y.o. F) Primary Care Provider: [place] Blas, [place] NC[facility] Other Clinician: Referring Provider: Treating Provider/Extender: Ramon Dredge in Treatment: 1 History of Present Illness HPI Description: ADMISSION 05/20/2022 This is a 77 year old woman with a chronic wound on her left foot. She has a remote history of trauma status post surgery and grafting many years ago. She reports that her dog stepped on her foot in December and the wound opened at that time. She was initially treated by her primary care doctor with mupirocin and a 10-day course of Bactrim for suspected cellulitis, back in February of this year. She is not diabetic and does not smoke. She was seen by Dr. Lajoyce Corners on March 26, 2022 and diagnosed with stasis dermatitis and chronic venous insufficiency. He prescribed compression stockings. His note makes no mention of the wound. She has not been wearing the compression stockings because she finds them difficult to apply. She reports wearing compression leggings, but is not wearing them today and cannot tell me what degree of  compression they provide. 05/29/2022: There is just 1 open area remaining on the dorsum of her foot. There is a little slough and eschar accumulation. She brought in her compression tights and although the degree of compression is not indicated, based on my own stretching and testing of the tensile strength, the best pair seems to be around 30 mmHg. She has some that  are a little less snug and I told her that I prefer her to use the tighter ones. Her venous reflux studies are scheduled for next Friday at 3 PM. Electronic Signature(s) Signed: 05/29/2022 1:25:49 PM By: Duanne Guess MD FACS Entered By: Duanne Guess on 05/29/2022 13:25:48 -------------------------------------------------------------------------------- Physical Exam Details Patient Name: Date of Service: [name], PA [name]. 05/29/2022 12:30 PM Medical Record Number: [id number] Patient Account Number: [id number] Date of Birth/Sex: Treating RN: Feb 06, 1945 (77 y.o. F) Primary Care Provider: [place] Blas, [place] [facility] Other Clinician: Referring Provider: Treating Provider/Extender: Ramon Dredge in Treatment: 1 Constitutional . . . . no acute distress. Respiratory Normal work of breathing on room air. Notes 05/29/2022: There is just 1 open area remaining on the dorsum of her foot. There is a little slough and eschar accumulation. Krista, [name] ([id number]) [id number] Page 3 of 8 Electronic Signature(s) Signed: 05/29/2022 1:26:28 PM By: Duanne Guess MD FACS Entered By: Duanne Guess on 05/29/2022 02:72:53 -------------------------------------------------------------------------------- Physician Orders Details Patient Name: Date of Service: [name], PA [name]. 05/29/2022 12:30 PM Medical Record Number: [id number] Patient Account Number: [id number] Date of Birth/Sex: Treating RN: 12-11-1945 (77 y.o. [name] Primary Care Provider: [place] Blas, [place] NCIS Other  Clinician: Referring Provider: Treating Provider/Extender: Ramon Dredge in Treatment: 1 Verbal / Phone Orders: No Diagnosis Coding Follow-up Appointments ppointment in 2 weeks. - 6/3 at 12:45pm with Dr Lady Gary Return A Anesthetic (In clinic) Topical Lidocaine 4% applied to wound bed Bathing/ Shower/ Hygiene May shower with protection but do not get wound dressing(s) wet. Protect dressing(s) with water repellant cover (for example, large plastic bag) or a cast cover and may then take shower. Edema Control - Lymphedema / SCD / Other Elevate legs to the level of the heart or above for 30 minutes daily and/or when sitting for 3-4 times a day throughout the day. Avoid standing for long periods of time. Patient to wear own compression stockings every day. - to right leg Wound Treatment Wound #1 - Foot Wound Laterality: Dorsal, Left Cleanser: Soap and Water 1 x Per Week/30 Days Discharge Instructions: May shower and wash wound with dial antibacterial soap and water prior to dressing change. Cleanser: Wound Cleanser 1 x Per Week/30 Days Discharge Instructions: Cleanse the wound with wound cleanser prior to applying a clean dressing using gauze sponges, not tissue or cotton balls. Peri-Wound Care: Triamcinolone 15 (g) 1 x Per Week/30 Days Discharge Instructions: Use triamcinolone 15 (g) as directed Peri-Wound Care: Sween Lotion (Moisturizing lotion) 1 x Per Week/30 Days Discharge Instructions: Apply moisturizing lotion as directed Prim Dressing: Maxorb Extra Ag+ Alginate Dressing, 2x2 (in/in) 1 x Per Week/30 Days ary Discharge Instructions: Apply to wound bed as instructed Secondary Dressing: Woven Gauze Sponge, Non-Sterile 4x4 in 1 x Per Week/30 Days Discharge Instructions: Apply over primary dressing as directed. Compression Wrap: Urgo K2, (equivalent to a 4 layer) two layer compression system, regular 1 x Per Week/30 Days Discharge Instructions: Apply Urgo K2 as  directed (alternative to 4 layer compression). Compression Wrap: Unnaboot w/Calamine, 4x10 (in/yd) 1 x Per Week/30 Days Discharge Instructions: Apply Unnaboot as directed. Electronic Signature(s) Signed: 05/29/2022 2:18:57 PM By: Duanne Guess MD FACS Signed: 06/03/2022 10:30:00 AM By: Brenton Grills Entered By: Brenton Grills on 05/29/2022 13:26:59 Lattie Corns ([id number]) [id number] Page 4 of 8 -------------------------------------------------------------------------------- Problem List Details Patient Name: Date of Service: [place], [name]. 05/29/2022 12:30 PM Medical Record Number: [id number] Patient Account Number: [id number] Date  of Birth/Sex: Treating RN: 01-Jul-1945 (77 y.o. [name] Primary Care Provider: [place] Blas, [place] NCIS Other Clinician: Referring Provider: Treating Provider/Extender: Ramon Dredge in Treatment: 1 Active Problems ICD-10 Encounter Code Description Active Date MDM Diagnosis [phone] Non-pressure chronic ulcer of other part of left foot with fat layer exposed 05/20/2022 No Yes I87.2 Venous insufficiency (chronic) (peripheral) 05/20/2022 No Yes I89.0 Lymphedema, not elsewhere classified 05/20/2022 No Yes Inactive Problems Resolved Problems Electronic Signature(s) Signed: 05/29/2022 1:21:28 PM By: Duanne Guess MD FACS Entered By: Duanne Guess on 05/29/2022 13:21:27 -------------------------------------------------------------------------------- Progress Note Details Patient Name: Date of Service: [name], PA [name]. 05/29/2022 12:30 PM Medical Record Number: [id number] Patient Account Number: [id number] Date of Birth/Sex: Treating RN: 12/01/1945 (77 y.o. F) Primary Care Provider: [place] Blas, [place] [facility] Other Clinician: Referring Provider: Treating Provider/Extender: Ramon Dredge in Treatment: 1 Subjective Chief Complaint Information obtained from Patient Patient  seen for complaints of Non-Healing Wound. History of Present Illness (HPI) ADMISSION 05/20/2022 This is a 77 year old woman with a chronic wound on her left foot. She has a remote history of trauma status post surgery and grafting many years ago. She reports that her dog stepped on her foot in December and the wound opened at that time. She was initially treated by her primary care doctor with mupirocin and a 10-day course of Bactrim for suspected cellulitis, back in February of this year. She is not diabetic and does not smoke. She was seen by Dr. Lajoyce Corners on March 26, 2022 and diagnosed with stasis dermatitis and chronic venous insufficiency. He prescribed compression stockings. His note makes no mention of the wound. She has not been wearing the compression stockings because she finds them difficult to apply. She reports wearing compression leggings, but is not wearing them today and cannot tell me what degree of compression they provide. 05/29/2022: There is just 1 open area remaining on the dorsum of her foot. There is a little slough and eschar accumulation. She brought in her compression Krista, [name] [name] ([id number]) [id number] Page 5 of 8 tights and although the degree of compression is not indicated, based on my own stretching and testing of the tensile strength, the best pair seems to be around 30 mmHg. She has some that are a little less snug and I told her that I prefer her to use the tighter ones. Her venous reflux studies are scheduled for next Friday at 3 PM. Patient History Family History Cancer - Mother, Diabetes, Heart Disease, Hypertension, No family history of Hereditary Spherocytosis, Kidney Disease, Lung Disease, Seizures, Stroke, Thyroid Problems, Tuberculosis. Social History Never smoker, Marital Status - Divorced, Alcohol Use - Never, Drug Use - No History, Caffeine Use - Daily. Medical History Respiratory Patient has history of  Asthma Cardiovascular Patient has history of Hypertension, Peripheral Venous Disease Musculoskeletal Patient has history of Osteoarthritis Hospitalization/Surgery History - total hip arthroplasty. - elbow arthroscopy with tendon reconstruction. - foot surgery. Objective Constitutional no acute distress. Vitals Time Taken: 12:48 PM, Height: 63 in, Temperature: 98.5 F, Pulse: 71 bpm, Respiratory Rate: 18 breaths/min, Blood Pressure: 131/76 mmHg. Respiratory Normal work of breathing on room air. General Notes: 05/29/2022: There is just 1 open area remaining on the dorsum of her foot. There is a little slough and eschar accumulation. Integumentary (Hair, Skin) Wound #1 status is Open. Original cause of wound was Trauma. The date acquired was: 12/24/2021. The wound has been in treatment 1 weeks. The wound is located on the Left,Dorsal Foot.  The wound measures 0.1cm length x 0.1cm width x 0.1cm depth; 0.008cm^2 area and 0.001cm^3 volume. There is Fat Layer (Subcutaneous Tissue) exposed. There is no tunneling or undermining noted. There is a medium amount of serous drainage noted. The wound margin is distinct with the outline attached to the wound base. There is small (1-33%) red granulation within the wound bed. There is a large (67-100%) amount of necrotic tissue within the wound bed including Adherent Slough. The periwound skin appearance had no abnormalities noted for color. The periwound skin appearance exhibited: Scarring, Dry/Scaly. Periwound temperature was noted as No Abnormality. Assessment Active Problems ICD-10 Non-pressure chronic ulcer of other part of left foot with fat layer exposed Venous insufficiency (chronic) (peripheral) Lymphedema, not elsewhere classified Procedures Wound #1 Pre-procedure diagnosis of Wound #1 is a Lymphedema located on the Left,Dorsal Foot . There was a Selective/Open Wound Non-Viable Tissue Debridement with a total area of 0.01 sq cm performed by Duanne Guess, MD. With the following instrument(s): Curette to remove Non-Viable tissue/material. Material removed includes [facility] after achieving pain control using Lidocaine 4% Topical Solution. A time out was conducted at 13:15, prior to the start of the procedure. A Minimum amount of bleeding was controlled with Pressure. The procedure was tolerated well with a pain level of 0 throughout and a pain level of 0 following the procedure. Post Debridement Measurements: 0.1cm length x 0.1cm width x 0.1cm depth; 0.001cm^3 volume. Character of Wound/Ulcer Post Debridement is improved. Post procedure Diagnosis Wound #1: Same as Pre-Procedure General Notes: scribed for Dr. Lady Gary by Brenton Grills, RN. Pre-procedure diagnosis of Wound #1 is a Lymphedema located on the Left,Dorsal Foot . There was a Three Layer Compression Therapy Procedure by Brenton Grills, RN. Post procedure Diagnosis Wound #1: Same as Pre-Procedure Younis, [name] ([id number]) [id number] Page 6 of 8 Plan Follow-up Appointments: Return Appointment in 1 week. - Dr. Lady Gary - room 2 Anesthetic: (In clinic) Topical Lidocaine 4% applied to wound bed Bathing/ Shower/ Hygiene: May shower with protection but do not get wound dressing(s) wet. Protect dressing(s) with water repellant cover (for example, large plastic bag) or a cast cover and may then take shower. Edema Control - Lymphedema / SCD / Other: Elevate legs to the level of the heart or above for 30 minutes daily and/or when sitting for 3-4 times a day throughout the day. Avoid standing for long periods of time. Patient to wear own compression stockings every day. - to right leg WOUND #1: - Foot Wound Laterality: Dorsal, Left Cleanser: Soap and Water 1 x Per Week/30 Days Discharge Instructions: May shower and wash wound with dial antibacterial soap and water prior to dressing change. Cleanser: Wound Cleanser 1 x Per Week/30 Days Discharge Instructions:  Cleanse the wound with wound cleanser prior to applying a clean dressing using gauze sponges, not tissue or cotton balls. Peri-Wound Care: Triamcinolone 15 (g) 1 x Per Week/30 Days Discharge Instructions: Use triamcinolone 15 (g) as directed Peri-Wound Care: Sween Lotion (Moisturizing lotion) 1 x Per Week/30 Days Discharge Instructions: Apply moisturizing lotion as directed Prim Dressing: Maxorb Extra Ag+ Alginate Dressing, 2x2 (in/in) 1 x Per Week/30 Days ary Discharge Instructions: Apply to wound bed as instructed Secondary Dressing: Woven Gauze Sponge, Non-Sterile 4x4 in 1 x Per Week/30 Days Discharge Instructions: Apply over primary dressing as directed. Com pression Wrap: Urgo K2, (equivalent to a 4 layer) two layer compression system, regular 1 x Per Week/30 Days Discharge Instructions: Apply Urgo K2 as directed (alternative to 4 layer compression).  Com pression Wrap: Unnaboot w/Calamine, 4x10 (in/yd) 1 x Per Week/30 Days Discharge Instructions: Apply Unnaboot as directed. 05/29/2022: There is just 1 open area remaining on the dorsum of her foot. There is a little slough and eschar accumulation. I used a curette to debride slough and eschar from the wound. We will continue silver alginate with 4-layer compression equivalent, using Unna boot at the top to minimize slippage. As she has her venous reflux studies on Friday at 3, there really is not time for her to come back and have the wrap reapplied; she will wear her compression tights and change the dressing herself over the weekend and then we will see her on Monday for her wound care visit. Electronic Signature(s) Signed: 07/06/2022 2:50:36 PM By: Pearletha Alfred Signed: 07/06/2022 2:55:18 PM By: Duanne Guess MD FACS Previous Signature: 06/04/2022 9:31:56 AM Version By: Shawn Stall RN, BSN Previous Signature: 06/04/2022 10:50:34 AM Version By: Duanne Guess MD FACS Previous Signature: 05/29/2022 1:28:08 PM Version By: Duanne Guess MD  FACS Entered By: Pearletha Alfred on 07/06/2022 14:50:35 -------------------------------------------------------------------------------- HxROS Details Patient Name: Date of Service: [name], PA [name]. 05/29/2022 12:30 PM Medical Record Number: [id number] Patient Account Number: [id number] Date of Birth/Sex: Treating RN: [birth year] (77 y.o. F) Primary Care Provider: [place] Blas, [place] NCIS Other Clinician: Referring Provider: Treating Provider/Extender: Ramon Dredge in Treatment: 1 Respiratory Medical History: Positive for: Asthma Cardiovascular Medical History: Positive for: Hypertension; Peripheral Venous Disease Musculoskeletal Medical HistoryANTOINETTE, [name] ([id number]) [id number] Page 7 of 8 Positive for: Osteoarthritis Immunizations Pneumococcal Vaccine: Received Pneumococcal Vaccination: No Implantable Devices None Hospitalization / Surgery History Type of Hospitalization/Surgery total hip arthroplasty elbow arthroscopy with tendon reconstruction foot surgery Family and Social History Cancer: Yes - Mother; Diabetes: Yes; Heart Disease: Yes; Hereditary Spherocytosis: No; Hypertension: Yes; Kidney Disease: No; Lung Disease: No; Seizures: No; Stroke: No; Thyroid Problems: No; Tuberculosis: No; Never smoker; Marital Status - Divorced; Alcohol Use: Never; Drug Use: No History; Caffeine Use: Daily; Financial Concerns: No; Food, Clothing or Shelter Needs: No; Support System Lacking: No; Transportation Concerns: No Electronic Signature(s) Signed: 05/29/2022 2:18:57 PM By: Duanne Guess MD FACS Entered By: Duanne Guess on 05/29/2022 13:25:58 -------------------------------------------------------------------------------- SuperBill Details Patient Name: Date of Service: [name], PA [name]. 05/29/2022 Medical Record Number: [id number] Patient Account Number: [id number] Date of Birth/Sex: Treating RN: [birth year] (77 y.o. [name] Primary Care Provider: Ilda Basset NG, [place] Other Clinician: Referring Provider: Treating Provider/Extender: Ramon Dredge in Treatment: 1 Diagnosis Coding ICD-10 Codes Code Description [phone] Non-pressure chronic ulcer of other part of left foot with fat layer exposed I87.2 Venous insufficiency (chronic) (peripheral) I89.0 Lymphedema, not elsewhere classified Facility Procedures : CPT4 Code: [id number] Description: [phone] - DEBRIDE WOUND 1ST 20 SQ CM OR < ICD-10 Diagnosis Description L97.522 Non-pressure chronic ulcer of other part of left foot with fat layer exposed Modifier: Quantity: 1 Physician Procedures : CPT4 Code Description Modifier [id number] - WC PHYS LEVEL 3 - EST PT 25 ICD-10 Diagnosis Description L97.522 Non-pressure chronic ulcer of other part of left foot with fat layer exposed I87.2 Venous insufficiency (chronic) (peripheral) I89.0  Lymphedema, not elsewhere classified Quantity: 1 Electronic Signature(s) Signed: 05/29/2022 1:28:25 PM By: Duanne Guess MD FACS Entered By: Duanne Guess on 05/29/2022 13:28:25

## 2022-07-15 ENCOUNTER — Encounter (HOSPITAL_BASED_OUTPATIENT_CLINIC_OR_DEPARTMENT_OTHER): Payer: Medicare Other | Attending: General Surgery | Admitting: General Surgery

## 2022-07-15 DIAGNOSIS — I10 Essential (primary) hypertension: Secondary | ICD-10-CM | POA: Insufficient documentation

## 2022-07-15 DIAGNOSIS — I890 Lymphedema, not elsewhere classified: Secondary | ICD-10-CM | POA: Diagnosis not present

## 2022-07-15 DIAGNOSIS — L97522 Non-pressure chronic ulcer of other part of left foot with fat layer exposed: Secondary | ICD-10-CM | POA: Insufficient documentation

## 2022-07-15 DIAGNOSIS — I872 Venous insufficiency (chronic) (peripheral): Secondary | ICD-10-CM | POA: Diagnosis not present

## 2022-07-15 NOTE — Progress Notes (Signed)
ZEFFIE, BICKERT ([id number]) [id number] Page 1 of 7 Visit Report for 07/15/2022 Arrival Information Details Patient Name: Date of Service: [place], [name] 07/15/2022 3:15 PM Medical Record Number: [id number] Patient Account Number: [id number] Date of Birth/Sex: Treating RN: 10-Apr-1945 (77 y.o. F) Primary Care Verlinda Slotnick: [place], [place] NCIS Other Clinician: Referring Clemence Lengyel: Treating Herbie Lehrmann/Extender: Ramon Dredge in Treatment: 8 Visit Information History Since Last Visit All ordered tests and consults were completed: No Patient Arrived: Gilmer Mor Added or deleted any medications: No Arrival Time: 15:30 Any new allergies or adverse reactions: No Accompanied By: self Had a fall or experienced change in No Transfer Assistance: None activities of daily living that may affect Patient Identification Verified: Yes risk of falls: Secondary Verification Process Completed: Yes Signs or symptoms of abuse/neglect since last visito No Patient Requires Transmission-Based Precautions: No Hospitalized since last visit: No Patient Has Alerts: No Implantable device outside of the clinic excluding No cellular tissue based products placed in the center since last visit: Pain Present Now: No Electronic Signature(s) Signed: 07/15/2022 5:05:21 PM By: Dayton Scrape Entered By: Dayton Scrape on 07/15/2022 15:30:48 -------------------------------------------------------------------------------- Encounter Discharge Information Details Patient Name: Date of Service: [name], [name]. 07/15/2022 3:15 PM Medical Record Number: [id number] Patient Account Number: [id number] Date of Birth/Sex: Treating RN: 04/17/1945 (77 y.o. [name] Primary Care Wyatt Thorstenson: [place] Blas, [place] Other Clinician: Referring Notnamed Scholz: Treating Kaylynne Andres/Extender: Ramon Dredge in Treatment: 8 Encounter Discharge Information Items Post Procedure  Vitals Discharge Condition: Stable Temperature (F): 97.9 Ambulatory Status: Cane Pulse (bpm): 76 Discharge Destination: Home Respiratory Rate (breaths/min): 18 Transportation: Private Auto Blood Pressure (mmHg): 132/70 Accompanied By: self Schedule Follow-up Appointment: Yes Clinical Summary of Care: Patient Declined Electronic Signature(s) Signed: 07/15/2022 4:28:57 PM By: Brenton Grills Entered By: Brenton Grills on 07/15/2022 16:07:06 Barbary, [name] ([id number]) [id number] Page 2 of 7 -------------------------------------------------------------------------------- Lower Extremity Assessment Details Patient Name: Date of Service: HO [place], [name] 07/15/2022 3:15 PM Medical Record Number: [id number] Patient Account Number: [id number] Date of Birth/Sex: Treating RN: 1945-07-19 (77 y.o. [name] Primary Care Iridessa Harrow: [place], [place] Other Clinician: Referring Tylynn Braniff: Treating Philip Kotlyar/Extender: Ramon Dredge in Treatment: 8 Edema Assessment Assessed: [Left: No] [Right: No] [Left: Edema] [Right: :] Calf Left: Right: Point of Measurement: From Medial Instep 45.1 cm Ankle Left: Right: Point of Measurement: From Medial Instep 24.3 cm Vascular Assessment Pulses: Dorsalis Pedis Palpable: [Left:Yes] Extremity colors, hair growth, and conditions: Hair Growth on Extremity: [Left:Yes] Temperature of Extremity: [Left:Warm] Capillary Refill: [Left:< 3 seconds] Dependent Rubor: [Left:No] Blanched when Elevated: [Left:No No] Toe Nail Assessment Left: Right: Thick: Yes Discolored: Yes Deformed: No Improper Length and Hygiene: No Electronic Signature(s) Signed: 07/15/2022 4:28:57 PM By: Brenton Grills Entered By: Brenton Grills on 07/15/2022 15:41:13 -------------------------------------------------------------------------------- Multi Wound Chart Details Patient Name: Date of Service: [name], [name].  07/15/2022 3:15 PM Medical Record Number: [id number] Patient Account Number: [id number] Date of Birth/Sex: Treating RN: 10-02-1945 (77 y.o. F) Primary Care Mane Consolo: [place] Blas, [place] NCIS Other Clinician: Referring Darthula Desa: Treating Keylie Beavers/Extender: Ramon Dredge in Treatment: 8 Vital Signs Height(in): 63 Pulse(bpm): 97 Weight(lbs): 210 Blood Pressure(mmHg): 144/79 Body Mass Index(BMI): 37.2 Edmiston, [name] ([id number]) [id number] Page 3 of 7 Temperature(F): 97.8 Respiratory Rate(breaths/min): 20 [1:Photos:] [N/A:N/A] Left, Dorsal Foot N/A N/A Wound Location: Trauma N/A N/A Wounding Event: Lymphedema N/A N/A Primary Etiology: Asthma, Hypertension, Peripheral N/A N/A Comorbid History: Venous Disease, Osteoarthritis 12/24/2021 N/A  N/A Date Acquired: 8 N/A N/A Weeks of Treatment: Open N/A N/A Wound Status: No N/A N/A Wound Recurrence: 0.7x0.7x0.1 N/A N/A Measurements L x W x D (cm) 0.385 N/A N/A A (cm) : rea 0.038 N/A N/A Volume (cm) : 41.70% N/A N/A % Reduction in A rea: 71.20% N/A N/A % Reduction in Volume: Full Thickness Without Exposed N/A N/A Classification: Support Structures Medium N/A N/A Exudate A mount: Serous N/A N/A Exudate Type: amber N/A N/A Exudate Color: Distinct, outline attached N/A N/A Wound Margin: Small (1-33%) N/A N/A Granulation A mount: Red N/A N/A Granulation Quality: Large (67-100%) N/A N/A Necrotic A mount: Fat Layer (Subcutaneous Tissue): Yes N/A N/A Exposed Structures: Fascia: No Tendon: No Muscle: No Joint: No Bone: No Small (1-33%) N/A N/A Epithelialization: Debridement - Selective/Open Wound N/A N/A Debridement: Pre-procedure Verification/Time Out 15:52 N/A N/A Taken: Lidocaine 4% Topical Solution N/A N/A Pain Control: Slough N/A N/A Tissue Debrided: Non-Viable Tissue N/A N/A Level: 0.38 N/A N/A Debridement A (sq cm): rea Curette N/A N/A Instrument: Minimum  N/A N/A Bleeding: Pressure N/A N/A Hemostasis A chieved: 0 N/A N/A Procedural Pain: 0 N/A N/A Post Procedural Pain: Procedure was tolerated well N/A N/A Debridement Treatment Response: 0.7x0.7x0.1 N/A N/A Post Debridement Measurements L x W x D (cm) 0.038 N/A N/A Post Debridement Volume: (cm) Scarring: Yes N/A N/A Periwound Skin Texture: Dry/Scaly: Yes N/A N/A Periwound Skin Moisture: No Abnormalities Noted N/A N/A Periwound Skin Color: No Abnormality N/A N/A Temperature: Debridement N/A N/A Procedures Performed: Treatment Notes Electronic Signature(s) Signed: 07/15/2022 3:59:00 PM By: Duanne Guess MD FACS Entered By: Duanne Guess on 07/15/2022 15:59:00 Loux, [name] ([id number]) [id number] Page 4 of 7 -------------------------------------------------------------------------------- Multi-Disciplinary Care Plan Details Patient Name: Date of Service: [place], [name] 07/15/2022 3:15 PM Medical Record Number: [id number] Patient Account Number: [id number] Date of Birth/Sex: Treating RN: 1945-02-06 (77 y.o. [name] Primary Care Topaz Raglin: [place] Blas, [place] NCIS Other Clinician: Referring Neveen Daponte: Treating Etheridge Geil/Extender: Ramon Dredge in Treatment: 8 Active Inactive Orientation to the Wound Care Program Nursing Diagnoses: Knowledge deficit related to the wound healing center program Goals: Patient/caregiver will verbalize understanding of the Wound Healing Center Program Date Initiated: 05/20/2022 Target Resolution Date: 06/12/2022 Goal Status: Active Interventions: Provide education on orientation to the wound center Notes: Wound/Skin Impairment Nursing Diagnoses: Impaired tissue integrity Knowledge deficit related to ulceration/compromised skin integrity Goals: Patient/caregiver will verbalize understanding of skin care regimen Date Initiated: 05/20/2022 Target Resolution Date: 07/10/2022 Goal  Status: Active Interventions: Assess ulceration(s) every visit Treatment Activities: Skin care regimen initiated : 05/20/2022 Topical wound management initiated : 05/20/2022 Notes: Electronic Signature(s) Signed: 07/15/2022 4:28:57 PM By: Brenton Grills Entered By: Brenton Grills on 07/15/2022 15:45:30 -------------------------------------------------------------------------------- Pain Assessment Details Patient Name: Date of Service: [name], [name]. 07/15/2022 3:15 PM Medical Record Number: [id number] Patient Account Number: [id number] Date of Birth/Sex: Treating RN: October 08, 1945 (77 y.o. F) Primary Care Hopie Pellegrin: [place] Bl[place], [place] NCIS Other Clinician: Referring Jalyah Weinheimer: Treating Jurell Basista/Extender: Ramon Dredge in Treatment: [address] [name], [name] ([id number]) [id number] Page 5 of 7 Location of Pain Severity and Description of Pain Patient Has Paino No Site Locations Pain Management and Medication Current Pain Management: Electronic Signature(s) Signed: 07/15/2022 5:05:21 PM By: Dayton Scrape Entered By: Dayton Scrape on 07/15/2022 15:31:23 -------------------------------------------------------------------------------- Patient/Caregiver Education Details Patient Name: Date of Service: [name], [name] [name] 7/10/2024andnbsp3:15 PM Medical Record Number: [id number] Patient Account Number: [id number] Date of Birth/Gender: Treating RN: [birth year] (77 y.o.  [name] Primary Care Physician: [place], [place] Other Clinician: Referring Physician: Treating Physician/Extender: Ramon Dredge in Treatment: 8 Education Assessment Education Provided To: Patient Education Topics Provided Wound/Skin Impairment: Methods: Explain/Verbal Responses: State content correctly Electronic Signature(s) Signed: 07/15/2022 4:28:57 PM By: Brenton Grills Entered By: Brenton Grills on 07/15/2022  15:45:47 -------------------------------------------------------------------------------- Wound Assessment Details Patient Name: Date of Service: [name], [name]. 07/15/2022 3:15 PM Bayer, [name] ([id number]) [id number] Page 6 of 7 Medical Record Number: [id number] Patient Account Number: [id number] Date of Birth/Sex: Treating RN: Mar 09, 1945 (77 y.o. F) Primary Care Jazzie Trampe: [place], [place] Other Clinician: Referring Talisa Petrak: Treating Megham Dwyer/Extender: Ramon Dredge in Treatment: 8 Wound Status Wound Number: 1 Primary Lymphedema Etiology: Wound Location: Left, Dorsal Foot Wound Status: Open Wounding Event: Trauma Comorbid Asthma, Hypertension, Peripheral Venous Disease, Date Acquired: 12/24/2021 History: Osteoarthritis Weeks Of Treatment: 8 Clustered Wound: No Photos Wound Measurements Length: (cm) 0.7 Width: (cm) 0.7 Depth: (cm) 0.1 Area: (cm) 0.385 Volume: (cm) 0.038 % Reduction in Area: 41.7% % Reduction in Volume: 71.2% Epithelialization: Small (1-33%) Wound Description Classification: Full Thickness Without Exposed Support Structures Wound Margin: Distinct, outline attached Exudate Amount: Medium Exudate Type: Serous Exudate Color: amber Foul Odor After Cleansing: No Slough/Fibrino No Wound Bed Granulation Amount: Small (1-33%) Exposed Structure Granulation Quality: Red Fascia Exposed: No Necrotic Amount: Large (67-100%) Fat Layer (Subcutaneous Tissue) Exposed: Yes Tendon Exposed: No Muscle Exposed: No Joint Exposed: No Bone Exposed: No Periwound Skin Texture Texture Color No Abnormalities Noted: No No Abnormalities Noted: Yes Scarring: Yes Temperature / Pain Temperature: No Abnormality Moisture No Abnormalities Noted: No Dry / Scaly: Yes Treatment Notes Wound #1 (Foot) Wound Laterality: Dorsal, Left Cleanser Soap and Water Discharge Instruction: May shower and wash wound with dial  antibacterial soap and water prior to dressing change. Wound Cleanser Discharge Instruction: Cleanse the wound with wound cleanser prior to applying a clean dressing using gauze sponges, not tissue or cotton balls. Peri-Wound Care Triamcinolone 15 (g) Discharge Instruction: Use triamcinolone 15 (g) as directed [name], [name] ([id number]) [id number] Page 7 of 7 Sween Lotion (Moisturizing lotion) Discharge Instruction: Apply moisturizing lotion as directed Topical Primary Dressing Promogran Prisma Matrix, 4.34 (sq in) (silver collagen) Discharge Instruction: Moisten collagen with saline or hydrogel Secondary Dressing Woven Gauze Sponge, Non-Sterile 4x4 in Discharge Instruction: Apply over primary dressing as directed. Zetuvit Plus Silicone Border Dressing 4x4 (in/in) Discharge Instruction: Apply silicone border over primary dressing as directed. Zetuvit Plus Silicone Border Dressing 5x5 (in/in) Discharge Instruction: Apply silicone border over primary dressing as directed. Secured With Compression Wrap Compression Stockings Facilities manager) Signed: 07/15/2022 5:05:21 PM By: Dayton Scrape Entered By: Dayton Scrape on 07/15/2022 15:35:42 -------------------------------------------------------------------------------- Vitals Details Patient Name: Date of Service: [name], [name]. 07/15/2022 3:15 PM Medical Record Number: [id number] Patient Account Number: [id number] Date of Birth/Sex: Treating RN: [birth year] (77 y.o. F) Primary Care Chancie Lampert: [place], [place] Other Clinician: Referring Taliyah Watrous: Treating Anelis Hrivnak/Extender: Ramon Dredge in Treatment: 8 Vital Signs Time Taken: 03:30 Temperature (F): 97.8 Height (in): 63 Pulse (bpm): 97 Weight (lbs): 210 Respiratory Rate (breaths/min): 20 Body Mass Index (BMI): 37.2 Blood Pressure (mmHg): 144/79 Reference Range: 80 - 120 mg / dl Electronic Signature(s) Signed:  07/15/2022 5:05:21 PM By: Dayton Scrape Entered By: Dayton Scrape on 07/15/2022 15:31:12

## 2022-07-15 NOTE — Progress Notes (Signed)
CHELCEY, CAPUTO ([id number]) [id number] Page 1 of 8 Visit Report for 07/15/2022 Chief Complaint Document Details Patient Name: Date of Service: [name] 07/15/2022 3:15 PM Medical Record Number: [id number] Patient Account Number: [id number] Date of Birth/Sex: Treating RN: 17-Oct-1945 (77 y.o. F) Primary Care Provider: [place] Blas, [place] [facility] Other Clinician: Referring Provider: Treating Provider/Extender: Ramon Dredge in Treatment: 8 Information Obtained from: Patient Chief Complaint Patient seen for complaints of Non-Healing Wound. Electronic Signature(s) Signed: 07/15/2022 3:59:06 PM By: Duanne Guess MD FACS Entered By: Duanne Guess on 07/15/2022 15:59:06 -------------------------------------------------------------------------------- Debridement Details Patient Name: Date of Service: [name] C. 07/15/2022 3:15 PM Medical Record Number: [id number] Patient Account Number: [id number] Date of Birth/Sex: Treating RN: [birth year] (77 y.o. F) Primary Care Provider: [place], [place] [facility] Other Clinician: Referring Provider: Treating Provider/Extender: Ramon Dredge in Treatment: 8 Debridement Performed for Assessment: Wound #1 Left,Dorsal Foot Performed By: Physician Duanne Guess, MD Debridement Type: Debridement Level of Consciousness (Pre-procedure): Awake and Alert Pre-procedure Verification/Time Out Yes - 15:52 Taken: Start Time: 15:53 Pain Control: Lidocaine 4% T opical Solution Percent of Wound Bed Debrided: 100% T Area Debrided (cm): otal 0.38 Tissue and other material debrided: Non-Viable, Slough, Slough Level: Non-Viable Tissue Debridement Description: Selective/Open Wound Instrument: Curette Bleeding: Minimum Hemostasis Achieved: Pressure End Time: 15:55 Procedural Pain: 0 Post Procedural Pain: 0 Response to Treatment: Procedure was tolerated well Level of Consciousness  (Post- Awake and Alert procedure): Post Debridement Measurements of Total Wound Length: (cm) 0.7 Width: (cm) 0.7 Depth: (cm) 0.1 Volume: (cm) 0.038 Character of Wound/Ulcer Post Debridement: Improved Tess, Shrika C ([id number]) [id number] Page 2 of 8 Post Procedure Diagnosis Same as Pre-procedure Notes scribed for Dr. Lady Gary by Brenton Grills, RN Electronic Signature(s) Signed: 07/15/2022 3:59:20 PM By: Duanne Guess MD FACS Entered By: Duanne Guess on 07/15/2022 15:59:19 -------------------------------------------------------------------------------- HPI Details Patient Name: Date of Service: [name] C. 07/15/2022 3:15 PM Medical Record Number: [id number] Patient Account Number: [id number] Date of Birth/Sex: Treating RN: Jul 29, 1945 (77 y.o. F) Primary Care Provider: [place] Blas, [place] [facility] Other Clinician: Referring Provider: Treating Provider/Extender: Ramon Dredge in Treatment: 8 History of Present Illness HPI Description: ADMISSION 05/20/2022 This is a 77 year old woman with a chronic wound on her left foot. She has a remote history of trauma status post surgery and grafting many years ago. She reports that her dog stepped on her foot in December and the wound opened at that time. She was initially treated by her primary care doctor with mupirocin and a 10-day course of Bactrim for suspected cellulitis, back in February of this year. She is not diabetic and does not smoke. She was seen by Dr. Lajoyce Corners on March 26, 2022 and diagnosed with stasis dermatitis and chronic venous insufficiency. He prescribed compression stockings. His note makes no mention of the wound. She has not been wearing the compression stockings because she finds them difficult to apply. She reports wearing compression leggings, but is not wearing them today and cannot tell me what degree of compression they provide. 05/29/2022: There is just 1 open area  remaining on the dorsum of her foot. There is a little slough and eschar accumulation. She brought in her compression tights and although the degree of compression is not indicated, based on my own stretching and testing of the tensile strength, the best pair seems to be around 30 mmHg. She has some that are a little less snug and I told her  that I prefer her to use the tighter ones. Her venous reflux studies are scheduled for next Friday at 3 PM. 06/10/2022: She did not make it to her venous reflux studies last week. The wound on the dorsum of her foot remains open with a little slough and eschar accumulation. She is not wearing her compression garment. 07/01/2022: Her venous reflux studies were done and were negative. The wound on the dorsum of her foot is smaller with slough and eschar buildup again. She is wearing her compression garments. 07/15/2022: The wound measures smaller again today but does persist. There is a little bit of slough accumulation. No concern for infection. Electronic Signature(s) Signed: 07/15/2022 3:59:53 PM By: Duanne Guess MD FACS Entered By: Duanne Guess on 07/15/2022 15:59:53 -------------------------------------------------------------------------------- Physical Exam Details Patient Name: Date of Service: [name] C. 07/15/2022 3:15 PM Medical Record Number: [id number] Patient Account Number: [id number] Date of Birth/Sex: Treating RN: 1945/11/03 (77 y.o. F) Primary Care Provider: [place] Blas, [place] [facility] Other Clinician: Referring Provider: Treating Provider/Extender: Ramon Dredge in Treatment: 8 Constitutional Slightly hypertensive. . . . no acute distress. ELSYE, MCCOLLISTER ([id number]) [id number] Page 3 of 8 Respiratory Normal work of breathing on room air. Notes 07/15/2022: The wound measures smaller again today but does persist. There is a little bit of slough accumulation. No concern for  infection. Electronic Signature(s) Signed: 07/15/2022 4:03:05 PM By: Duanne Guess MD FACS Entered By: Duanne Guess on 07/15/2022 16:03:05 -------------------------------------------------------------------------------- Physician Orders Details Patient Name: Date of Service: [name] C. 07/15/2022 3:15 PM Medical Record Number: [id number] Patient Account Number: [id number] Date of Birth/Sex: Treating RN: 27-Oct-1945 (77 y.o. [name] Primary Care Provider:  Blas, [place] [facility] Other Clinician: Referring Provider: Treating Provider/Extender: Ramon Dredge in Treatment: 8 Verbal / Phone Orders: No Diagnosis Coding ICD-10 Coding Code Description [phone] Non-pressure chronic ulcer of other part of left foot with fat layer exposed I87.2 Venous insufficiency (chronic) (peripheral) I89.0 Lymphedema, not elsewhere classified Follow-up Appointments ppointment in 2 weeks. - Dr Lady Gary Return A Anesthetic (In clinic) Topical Lidocaine 4% applied to wound bed Bathing/ Shower/ Hygiene May shower with protection but do not get wound dressing(s) wet. Protect dressing(s) with water repellant cover (for example, large plastic bag) or a cast cover and may then take shower. Edema Control - Lymphedema / SCD / Other Elevate legs to the level of the heart or above for 30 minutes daily and/or when sitting for 3-4 times a day throughout the day. Avoid standing for long periods of time. Patient to wear own compression stockings every day. - to right leg Moisturize legs daily. - Sween lotion Wound Treatment Wound #1 - Foot Wound Laterality: Dorsal, Left Cleanser: Soap and Water 1 x Per Week/30 Days Discharge Instructions: May shower and wash wound with dial antibacterial soap and water prior to dressing change. Cleanser: Wound Cleanser (DME) (Generic) 1 x Per Week/30 Days Discharge Instructions: Cleanse the wound with wound cleanser prior to applying a clean  dressing using gauze sponges, not tissue or cotton balls. Peri-Wound Care: Triamcinolone 15 (g) (Generic) 1 x Per Week/30 Days Discharge Instructions: Use triamcinolone 15 (g) as directed Peri-Wound Care: Sween Lotion (Moisturizing lotion) (Generic) 1 x Per Week/30 Days Discharge Instructions: Apply moisturizing lotion as directed Prim Dressing: Promogran Prisma Matrix, 4.34 (sq in) (silver collagen) (DME) (Generic) 1 x Per Week/30 Days ary Discharge Instructions: Moisten collagen with saline or hydrogel Secondary Dressing: Woven Gauze Sponge, Non-Sterile 4x4 in (DME) (  Generic) 1 x Per Week/30 Days Discharge Instructions: Apply over primary dressing as directed. Secondary Dressing: Zetuvit Plus Silicone Border Dressing 4x4 (in/in) (DME) (Generic) 1 x Per Week/30 Days Discharge Instructions: Apply silicone border over primary dressing as directed. GEORGEANN, BRINKMAN ([id number]) [id number] Page 4 of 8 Secondary Dressing: Zetuvit Plus Silicone Border Dressing 5x5 (in/in) (DME) (Generic) 1 x Per Week/30 Days Discharge Instructions: Apply silicone border over primary dressing as directed. Electronic Signature(s) Signed: 07/15/2022 4:07:16 PM By: Duanne Guess MD FACS Entered By: Duanne Guess on 07/15/2022 16:03:34 -------------------------------------------------------------------------------- Problem List Details Patient Name: Date of Service: [name] C. 07/15/2022 3:15 PM Medical Record Number: [id number] Patient Account Number: [id number] Date of Birth/Sex: Treating RN: 1945-02-24 (77 y.o. [name] Primary Care Provider: [place] Blas, [place] [facility] Other Clinician: Referring Provider: Treating Provider/Extender: Ramon Dredge in Treatment: 8 Active Problems ICD-10 Encounter Code Description Active Date MDM Diagnosis L97.522 Non-pressure chronic ulcer of other part of left foot with fat layer exposed 05/20/2022 No Yes I87.2  Venous insufficiency (chronic) (peripheral) 05/20/2022 No Yes I89.0 Lymphedema, not elsewhere classified 05/20/2022 No Yes Inactive Problems Resolved Problems Electronic Signature(s) Signed: 07/15/2022 3:58:54 PM By: Duanne Guess MD FACS Entered By: Duanne Guess on 07/15/2022 15:58:54 -------------------------------------------------------------------------------- Progress Note Details Patient Name: Date of Service: [name] C. 07/15/2022 3:15 PM Medical Record Number: [id number] Patient Account Number: [id number] Date of Birth/Sex: Treating RN: 1945/06/21 (77 y.o. F) Primary Care Provider: [place] Blas, [place] [facility] Other Clinician: Referring Provider: Treating Provider/Extender: Ramon Dredge in Treatment: 8 Subjective Chief Complaint Information obtained from Patient ZYAH, GOMM ([id number]) [id number] Page 5 of 8 Patient seen for complaints of Non-Healing Wound. History of Present Illness (HPI) ADMISSION 05/20/2022 This is a 77 year old woman with a chronic wound on her left foot. She has a remote history of trauma status post surgery and grafting many years ago. She reports that her dog stepped on her foot in December and the wound opened at that time. She was initially treated by her primary care doctor with mupirocin and a 10-day course of Bactrim for suspected cellulitis, back in February of this year. She is not diabetic and does not smoke. She was seen by Dr. Lajoyce Corners on March 26, 2022 and diagnosed with stasis dermatitis and chronic venous insufficiency. He prescribed compression stockings. His note makes no mention of the wound. She has not been wearing the compression stockings because she finds them difficult to apply. She reports wearing compression leggings, but is not wearing them today and cannot tell me what degree of compression they provide. 05/29/2022: There is just 1 open area remaining on the dorsum of her  foot. There is a little slough and eschar accumulation. She brought in her compression tights and although the degree of compression is not indicated, based on my own stretching and testing of the tensile strength, the best pair seems to be around 30 mmHg. She has some that are a little less snug and I told her that I prefer her to use the tighter ones. Her venous reflux studies are scheduled for next Friday at 3 PM. 06/10/2022: She did not make it to her venous reflux studies last week. The wound on the dorsum of her foot remains open with a little slough and eschar accumulation. She is not wearing her compression garment. 07/01/2022: Her venous reflux studies were done and were negative. The wound on the dorsum of her foot is smaller with slough and  eschar buildup again. She is wearing her compression garments. 07/15/2022: The wound measures smaller again today but does persist. There is a little bit of slough accumulation. No concern for infection. Patient History Family History Cancer - Mother, Diabetes, Heart Disease, Hypertension, No family history of Hereditary Spherocytosis, Kidney Disease, Lung Disease, Seizures, Stroke, Thyroid Problems, Tuberculosis. Social History Never smoker, Marital Status - Divorced, Alcohol Use - Never, Drug Use - No History, Caffeine Use - Daily. Medical History Respiratory Patient has history of Asthma Cardiovascular Patient has history of Hypertension, Peripheral Venous Disease Musculoskeletal Patient has history of Osteoarthritis Hospitalization/Surgery History - total hip arthroplasty. - elbow arthroscopy with tendon reconstruction. - foot surgery. Objective Constitutional Slightly hypertensive. no acute distress. Vitals Time Taken: 3:30 AM, Height: 63 in, Weight: 210 lbs, BMI: 37.2, Temperature: 97.8 F, Pulse: 97 bpm, Respiratory Rate: 20 breaths/min, Blood Pressure: 144/79 mmHg. Respiratory Normal work of breathing on room air. General Notes:  07/15/2022: The wound measures smaller again today but does persist. There is a little bit of slough accumulation. No concern for infection. Integumentary (Hair, Skin) Wound #1 status is Open. Original cause of wound was Trauma. The date acquired was: 12/24/2021. The wound has been in treatment 8 weeks. The wound is located on the Left,Dorsal Foot. The wound measures 0.7cm length x 0.7cm width x 0.1cm depth; 0.385cm^2 area and 0.038cm^3 volume. There is Fat Layer (Subcutaneous Tissue) exposed. There is a medium amount of serous drainage noted. The wound margin is distinct with the outline attached to the wound base. There is small (1-33%) red granulation within the wound bed. There is a large (67-100%) amount of necrotic tissue within the wound bed. The periwound skin appearance had no abnormalities noted for color. The periwound skin appearance exhibited: Scarring, Dry/Scaly. Periwound temperature was noted as No Abnormality. Assessment Active Problems ICD-10 Non-pressure chronic ulcer of other part of left foot with fat layer exposed Venous insufficiency (chronic) (peripheral) Lymphedema, not elsewhere classified Eslick, Delanie C ([id number]) [phone].pdf Page 6 of 8 Procedures Wound #1 Pre-procedure diagnosis of Wound #1 is a Lymphedema located on the Left,Dorsal Foot . There was a Selective/Open Wound Non-Viable Tissue Debridement with a total area of 0.38 sq cm performed by Duanne Guess, MD. With the following instrument(s): Curette to remove Non-Viable tissue/material. Material removed includes [facility] after achieving pain control using Lidocaine 4% Topical Solution. No specimens were taken. A time out was conducted at 15:52, prior to the start of the procedure. A Minimum amount of bleeding was controlled with Pressure. The procedure was tolerated well with a pain level of 0 throughout and a pain level of 0 following the procedure. Post Debridement  Measurements: 0.7cm length x 0.7cm width x 0.1cm depth; 0.038cm^3 volume. Character of Wound/Ulcer Post Debridement is improved. Post procedure Diagnosis Wound #1: Same as Pre-Procedure General Notes: scribed for Dr. Lady Gary by Brenton Grills, RN. Plan Follow-up Appointments: Return Appointment in 2 weeks. - Dr Lady Gary Anesthetic: (In clinic) Topical Lidocaine 4% applied to wound bed Bathing/ Shower/ Hygiene: May shower with protection but do not get wound dressing(s) wet. Protect dressing(s) with water repellant cover (for example, large plastic bag) or a cast cover and may then take shower. Edema Control - Lymphedema / SCD / Other: Elevate legs to the level of the heart or above for 30 minutes daily and/or when sitting for 3-4 times a day throughout the day. Avoid standing for long periods of time. Patient to wear own compression stockings every day. - to right leg Moisturize legs  daily. - Sween lotion WOUND #1: - Foot Wound Laterality: Dorsal, Left Cleanser: Soap and Water 1 x Per Week/30 Days Discharge Instructions: May shower and wash wound with dial antibacterial soap and water prior to dressing change. Cleanser: Wound Cleanser (DME) (Generic) 1 x Per Week/30 Days Discharge Instructions: Cleanse the wound with wound cleanser prior to applying a clean dressing using gauze sponges, not tissue or cotton balls. Peri-Wound Care: Triamcinolone 15 (g) (Generic) 1 x Per Week/30 Days Discharge Instructions: Use triamcinolone 15 (g) as directed Peri-Wound Care: Sween Lotion (Moisturizing lotion) (Generic) 1 x Per Week/30 Days Discharge Instructions: Apply moisturizing lotion as directed Prim Dressing: Promogran Prisma Matrix, 4.34 (sq in) (silver collagen) (DME) (Generic) 1 x Per Week/30 Days ary Discharge Instructions: Moisten collagen with saline or hydrogel Secondary Dressing: Woven Gauze Sponge, Non-Sterile 4x4 in (DME) (Generic) 1 x Per Week/30 Days Discharge Instructions: Apply over  primary dressing as directed. Secondary Dressing: Zetuvit Plus Silicone Border Dressing 4x4 (in/in) (DME) (Generic) 1 x Per Week/30 Days Discharge Instructions: Apply silicone border over primary dressing as directed. Secondary Dressing: Zetuvit Plus Silicone Border Dressing 5x5 (in/in) (DME) (Generic) 1 x Per Week/30 Days Discharge Instructions: Apply silicone border over primary dressing as directed. 07/15/2022: The wound measures smaller again today but does persist. There is a little bit of slough accumulation. No concern for infection. I used a curette to debride the slough from her wound. Change the contact layer to Prisma silver collagen to see if occluded. He will granulation tissue to fill in the remaining space. She will follow-up in one week. Electronic Signature(s) Signed: 07/15/2022 4:04:21 PM By: Duanne Guess MD FACS Entered By: Duanne Guess on 07/15/2022 16:04:21 -------------------------------------------------------------------------------- HxROS Details Patient Name: Date of Service: [name] C. 07/15/2022 3:15 PM Medical Record Number: [id number] Patient Account Number: [id number] Date of Birth/Sex: Treating RN: 06/30/1945 (77 y.o. F) Primary Care Provider: [place] Blas, [place] [facility] Other Clinician: Referring Provider: Treating Provider/Extender: Ramon Dredge in Treatment: [address] Roshawn C ([id number]) [id number] Page 7 of 8 Respiratory Medical History: Positive for: Asthma Cardiovascular Medical History: Positive for: Hypertension; Peripheral Venous Disease Musculoskeletal Medical History: Positive for: Osteoarthritis Immunizations Pneumococcal Vaccine: Received Pneumococcal Vaccination: No Implantable Devices None Hospitalization / Surgery History Type of Hospitalization/Surgery total hip arthroplasty elbow arthroscopy with tendon reconstruction foot surgery Family and Social History Cancer: Yes  - Mother; Diabetes: Yes; Heart Disease: Yes; Hereditary Spherocytosis: No; Hypertension: Yes; Kidney Disease: No; Lung Disease: No; Seizures: No; Stroke: No; Thyroid Problems: No; Tuberculosis: No; Never smoker; Marital Status - Divorced; Alcohol Use: Never; Drug Use: No History; Caffeine Use: Daily; Financial Concerns: No; Food, Clothing or Shelter Needs: No; Support System Lacking: No; Transportation Concerns: No Electronic Signature(s) Signed: 07/15/2022 4:07:16 PM By: Duanne Guess MD FACS Entered By: Duanne Guess on 07/15/2022 16:02:44 -------------------------------------------------------------------------------- SuperBill Details Patient Name: Date of Service: [name] C. 07/15/2022 Medical Record Number: [id number] Patient Account Number: [id number] Date of Birth/Sex: Treating RN: 11-15-1945 (77 y.o. [name] Primary Care Provider: [place] Blas, [place] [facility] Other Clinician: Referring Provider: Treating Provider/Extender: Ramon Dredge in Treatment: 8 Diagnosis Coding ICD-10 Codes Code Description [id number] Non-pressure chronic ulcer of other part of left foot with fat layer exposed I87.2 Venous insufficiency (chronic) (peripheral) I89.0 Lymphedema, not elsewhere classified Facility Procedures : 7 CPT4 Code: [id number] Description: 97597 - DEBRIDE WOUND 1ST 20 SQ CM OR < ICD-10 Diagnosis Description L97.522 Non-pressure chronic ulcer of other part of left foot with  fat layer exposed Modifier: Quantity: 1 Physician Procedures : CPT4 Code Description Modifier KATERIN, NEGRETE ([id number]) [id number] - WC PHYS LEVEL 3 - EST PT 25 1 ICD-10 Diagnosis Description L97.522 Non-pressure chronic ulcer of other part of left foot with fat layer  exposed I87.2 Venous insufficiency (chronic) (peripheral) I89.0 Lymphedema, not elsewhere classified Quantity: 227.pdf Page 8 of 8 : [id number] - WC PHYS DEBR WO ANESTH 20 SQ  CM 1 ICD-10 Diagnosis Description L97.522 Non-pressure chronic ulcer of other part of left foot with fat layer exposed Quantity: Electronic Signature(s) Signed: 07/15/2022 4:04:33 PM By: Duanne Guess MD FACS Entered By: Duanne Guess on 07/15/2022 16:04:33

## 2022-07-29 ENCOUNTER — Encounter (HOSPITAL_BASED_OUTPATIENT_CLINIC_OR_DEPARTMENT_OTHER): Payer: Medicare Other | Admitting: General Surgery

## 2022-07-29 DIAGNOSIS — L97522 Non-pressure chronic ulcer of other part of left foot with fat layer exposed: Secondary | ICD-10-CM | POA: Diagnosis not present

## 2022-07-29 NOTE — Progress Notes (Signed)
JAELIANA, LOCOCO ([id number]) [phone].pdf Page 1 of 8 Visit Report for 07/29/2022 Arrival Information Details Patient Name: Date of Service: [place], [name]. 07/29/2022 1:00 PM Medical Record Number: [id number] Patient Account Number: [id number] Date of Birth/Sex: Treating RN: [birth year] (77 y.o. [name] Primary Care Ayeisha Lindenberger: Ilda Basset NG, [place] NCIS Other Clinician: Referring Keedan Sample: Treating Deaysia Grigoryan/Extender: Ramon Dredge in Treatment: 10 Visit Information History Since Last Visit All ordered tests and consults were completed: Yes Patient Arrived: [name] Added or deleted any medications: No Arrival Time: 13:23 Any new allergies or adverse reactions: No Accompanied By: self Had a fall or experienced change in No Transfer Assistance: None activities of daily living that may affect Patient Identification Verified: Yes risk of falls: Secondary Verification Process Completed: Yes Signs or symptoms of abuse/neglect since last visito No Patient Requires Transmission-Based Precautions: No Hospitalized since last visit: No Patient Has Alerts: No Implantable device outside of the clinic excluding No cellular tissue based products placed in the center since last visit: Has Dressing in Place as Prescribed: Yes Pain Present Now: No Electronic Signature(s) Signed: 07/29/2022 5:05:32 PM By: Brenton Grills Entered By: Brenton Grills on 07/29/2022 13:23:56 -------------------------------------------------------------------------------- Encounter Discharge Information Details Patient Name: Date of Service: Krista DGES, [name] 07/29/2022 1:00 PM Medical Record Number: [id number] Patient Account Number: [id number] Date of Birth/Sex: Treating RN: 04/21/1945 (77 y.o. [name] Primary Care Natallie Ravenscroft: Valatie Blas, [place] NCIS Other Clinician: Referring Emori Kamau: Treating Cambria Osten/Extender: Ramon Dredge in  Treatment: 10 Encounter Discharge Information Items Post Procedure Vitals Discharge Condition: Stable Temperature (F): 98.2 Ambulatory Status: Cane Pulse (bpm): 78 Discharge Destination: Home Respiratory Rate (breaths/min): 18 Transportation: Private Auto Blood Pressure (mmHg): 132/80 Accompanied By: self Schedule Follow-up Appointment: Yes Clinical Summary of Care: Patient Declined Notes Scribed for Dr Lady Gary by Brenton Grills RN Electronic Signature(s) Signed: 07/29/2022 5:05:32 PM By: Brenton Grills Entered By: Brenton Grills on 07/29/2022 13:57:33 Farrington, [name] ([id number]) [id number] Page 2 of 8 -------------------------------------------------------------------------------- Lower Extremity Assessment Details Patient Name: Date of Service: [name], Krista TRICIA C. 07/29/2022 1:00 PM Medical Record Number: [id number] Patient Account Number: [id number] Date of Birth/Sex: Treating RN: 16-Dec-1945 (77 y.o. [name] Primary Care Letita Prentiss: [place] Blas, F[place] NCIS Other Clinician: Referring Kristiann Noyce: Treating Wynne Jury/Extender: Ramon Dredge in Treatment: 10 Edema Assessment Assessed: [Left: No] [Right: No] [Left: Edema] [Right: :] Calf Left: Right: Point of Measurement: From Medial Instep 45.1 cm Ankle Left: Right: Point of Measurement: From Medial Instep 24.3 cm Vascular Assessment Pulses: Dorsalis Pedis Palpable: [Left:Yes] Extremity colors, hair growth, and conditions: Hair Growth on Extremity: [Left:Yes] Temperature of Extremity: [Left:Warm] Capillary Refill: [Left:< 3 seconds] Dependent Rubor: [Left:No No] Toe Nail Assessment Left: Right: Thick: No Discolored: No Deformed: No Improper Length and Hygiene: No Electronic Signature(s) Signed: 07/29/2022 5:05:32 PM By: Brenton Grills Entered By: Brenton Grills on 07/29/2022  13:30:46 -------------------------------------------------------------------------------- Multi Wound Chart Details Patient Name: Date of Service: Krista DGES, [name] 07/29/2022 1:00 PM Medical Record Number: [id number] Patient Account Number: [id number] Date of Birth/Sex: Treating RN: [birth year] (77 y.o. F) Primary Care Sherra Kimmons: Robeline Blas, [place] NCIS Other Clinician: Referring Corean Yoshimura: Treating Shalondra Wunschel/Extender: Ramon Dredge in Treatment: 10 Vital Signs Height(in): 63 Pulse(bpm): 62 Weight(lbs): 210 Blood Pressure(mmHg): 146/83 Body Mass Index(BMI): 37.2 Temperature(F): 98.5 [name], Krista C ([id number]) [id number] Page 3 of 8 Respiratory Rate(breaths/min): 18 [1:Photos:] [N/A:N/A] Left, Dorsal Foot N/A N/A Wound Location: Trauma N/A N/A Wounding Event: Lymphedema N/A N/A  Primary Etiology: Asthma, Hypertension, Peripheral N/A N/A Comorbid History: Venous Disease, Osteoarthritis 12/24/2021 N/A N/A Date Acquired: 10 N/A N/A Weeks of Treatment: Open N/A N/A Wound Status: No N/A N/A Wound Recurrence: 0.5x0.3x0.1 N/A N/A Measurements L x W x D (cm) 0.118 N/A N/A A (cm) : rea 0.012 N/A N/A Volume (cm) : 82.10% N/A N/A % Reduction in A rea: 90.90% N/A N/A % Reduction in Volume: Full Thickness Without Exposed N/A N/A Classification: Support Structures Medium N/A N/A Exudate A mount: Serous N/A N/A Exudate Type: amber N/A N/A Exudate Color: Distinct, outline attached N/A N/A Wound Margin: Small (1-33%) N/A N/A Granulation A mount: Red N/A N/A Granulation Quality: Large (67-100%) N/A N/A Necrotic A mount: Fat Layer (Subcutaneous Tissue): Yes N/A N/A Exposed Structures: Fascia: No Tendon: No Muscle: No Joint: No Bone: No Small (1-33%) N/A N/A Epithelialization: Debridement - Selective/Open Wound N/A N/A Debridement: Pre-procedure Verification/Time Out 13:50 N/A N/A Taken: Lidocaine 4% Topical  Solution N/A N/A Pain Control: Slough N/A N/A Tissue Debrided: Non-Viable Tissue N/A N/A Level: 0.12 N/A N/A Debridement A (sq cm): rea Curette N/A N/A Instrument: Minimum N/A N/A Bleeding: Pressure N/A N/A Hemostasis A chieved: 0 N/A N/A Procedural Pain: 0 N/A N/A Post Procedural Pain: Procedure was tolerated well N/A N/A Debridement Treatment Response: 0.5x0.3x0.1 N/A N/A Post Debridement Measurements L x W x D (cm) 0.012 N/A N/A Post Debridement Volume: (cm) Scarring: Yes N/A N/A Periwound Skin Texture: Dry/Scaly: Yes N/A N/A Periwound Skin Moisture: No Abnormalities Noted N/A N/A Periwound Skin Color: No Abnormality N/A N/A Temperature: Debridement N/A N/A Procedures Performed: Treatment Notes Wound #1 (Foot) Wound Laterality: Dorsal, Left Cleanser Soap and Water Discharge Instruction: May shower and wash wound with dial antibacterial soap and water prior to dressing change. Wound Cleanser Discharge Instruction: Cleanse the wound with wound cleanser prior to applying a clean dressing using gauze sponges, not tissue or cotton balls. Peri-Wound Care Triamcinolone 15 (g) Discharge Instruction: Use triamcinolone 15 (g) as directed Sween Lotion (Moisturizing lotion) Discharge Instruction: Apply moisturizing lotion as directed LARINA, LIEURANCE ([id number]) [id number] Page 4 of 8 Topical Gentamicin Discharge Instruction: As directed by physician Mupirocin Ointment Discharge Instruction: Apply Mupirocin (Bactroban) as instructed Primary Dressing Promogran Prisma Matrix, 4.34 (sq in) (silver collagen) Discharge Instruction: Moisten collagen with saline or hydrogel Secondary Dressing Woven Gauze Sponge, Non-Sterile 4x4 in Discharge Instruction: Apply over primary dressing as directed. Zetuvit Plus Silicone Border Dressing 4x4 (in/in) Discharge Instruction: Apply silicone border over primary dressing as directed. Zetuvit Plus Silicone  Border Dressing 5x5 (in/in) Discharge Instruction: Apply silicone border over primary dressing as directed. Secured With Compression Wrap Compression Stockings Facilities manager) Signed: 07/29/2022 1:56:08 PM By: Duanne Guess MD FACS Entered By: Duanne Guess on 07/29/2022 13:56:07 -------------------------------------------------------------------------------- Multi-Disciplinary Care Plan Details Patient Name: Date of Service: Krista DGES, [name] 07/29/2022 1:00 PM Medical Record Number: [id number] Patient Account Number: [id number] Date of Birth/Sex: Treating RN: 05-Sep-1945 (77 y.o. [name] Primary Care Mashawn Brazil: [place] Blas, [place] NCIS Other Clinician: Referring Khloee Garza: Treating Vernisha Bacote/Extender: Ramon Dredge in Treatment: 10 Active Inactive Orientation to the Wound Care Program Nursing Diagnoses: Knowledge deficit related to the wound healing center program Goals: Patient/caregiver will verbalize understanding of the Wound Healing Center Program Date Initiated: 05/20/2022 Target Resolution Date: 06/12/2022 Goal Status: Active Interventions: Provide education on orientation to the wound center Notes: Wound/Skin Impairment Nursing Diagnoses: Impaired tissue integrity Knowledge deficit related to ulceration/compromised skin integrity GoalsSERIYAH, COLLISON ([id number]) [id number] Page 5  of 8 Patient/caregiver will verbalize understanding of skin care regimen Date Initiated: 05/20/2022 Target Resolution Date: 07/10/2022 Goal Status: Active Interventions: Assess ulceration(s) every visit Treatment Activities: Skin care regimen initiated : 05/20/2022 Topical wound management initiated : 05/20/2022 Notes: Electronic Signature(s) Signed: 07/29/2022 5:05:32 PM By: Brenton Grills Entered By: Brenton Grills on 07/29/2022  13:44:37 -------------------------------------------------------------------------------- Pain Assessment Details Patient Name: Date of Service: Krista DGES, [name] 07/29/2022 1:00 PM Medical Record Number: [id number] Patient Account Number: [id number] Date of Birth/Sex: Treating RN: 07/09/1945 (77 y.o. [name] Primary Care Lakeitha Basques: [place] Blas, [place] NCIS Other Clinician: Referring Emmaus Brandi: Treating Junaid Wurzer/Extender: Ramon Dredge in Treatment: 10 Active Problems Location of Pain Severity and Description of Pain Patient Has Paino No Site Locations Pain Management and Medication Current Pain Management: Electronic Signature(s) Signed: 07/29/2022 5:05:32 PM By: Brenton Grills Entered By: Brenton Grills on 07/29/2022 13:24:34 Patient/Caregiver Education Details -------------------------------------------------------------------------------- Lattie Corns ([id number]) [id number] Page 6 of 8 Patient Name: Date of Service: Krista Stevinson, [name] 7/24/2024andnbsp1:00 PM Medical Record Number: [id number] Patient Account Number: [id number] Date of Birth/Gender: Treating RN: 07/13/1945 (77 y.o. [name] Primary Care Physician: [place] Blas, [place] NCIS Other Clinician: Referring Physician: Treating Physician/Extender: Ramon Dredge in Treatment: 10 Education Assessment Education Provided To: Patient Education Topics Provided Wound/Skin Impairment: Methods: Explain/Verbal Responses: State content correctly Electronic Signature(s) Signed: 07/29/2022 5:05:32 PM By: Brenton Grills Entered By: Brenton Grills on 07/29/2022 13:44:54 -------------------------------------------------------------------------------- Wound Assessment Details Patient Name: Date of Service: Krista DGES, [name] 07/29/2022 1:00 PM Medical Record Number: [id number] Patient Account Number: [id number] Date of Birth/Sex: Treating  RN: 1945-11-24 (77 y.o. [name] Primary Care Adreyan Carbajal: Ilda Basset NG, [place] Other Clinician: Referring Ulani Degrasse: Treating Sigurd Pugh/Extender: Ramon Dredge in Treatment: 10 Wound Status Wound Number: 1 Primary Lymphedema Etiology: Wound Location: Left, Dorsal Foot Wound Status: Open Wounding Event: Trauma Comorbid Asthma, Hypertension, Peripheral Venous Disease, Date Acquired: 12/24/2021 History: Osteoarthritis Weeks Of Treatment: 10 Clustered Wound: No Photos Wound Measurements Length: (cm) 0.5 Width: (cm) 0.3 Depth: (cm) 0.1 Area: (cm) 0.118 Volume: (cm) 0.012 % Reduction in Area: 82.1% % Reduction in Volume: 90.9% Epithelialization: Small (1-33%) Tunneling: No Undermining: No Wound Description Classification: Full Thickness Without Exposed Support Wound Margin: Distinct, outline attached Exudate Amount: Medium Fludd, Corianna C ([id number]) Exudate Type: Serous Exudate Color: amber Structures Foul Odor After Cleansing: No Slough/Fibrino No [id number] Page 7 of 8 Wound Bed Granulation Amount: Small (1-33%) Exposed Structure Granulation Quality: Red Fascia Exposed: No Necrotic Amount: Large (67-100%) Fat Layer (Subcutaneous Tissue) Exposed: Yes Tendon Exposed: No Muscle Exposed: No Joint Exposed: No Bone Exposed: No Periwound Skin Texture Texture Color No Abnormalities Noted: No No Abnormalities Noted: Yes Scarring: Yes Temperature / Pain Temperature: No Abnormality Moisture No Abnormalities Noted: No Dry / Scaly: Yes Treatment Notes Wound #1 (Foot) Wound Laterality: Dorsal, Left Cleanser Soap and Water Discharge Instruction: May shower and wash wound with dial antibacterial soap and water prior to dressing change. Wound Cleanser Discharge Instruction: Cleanse the wound with wound cleanser prior to applying a clean dressing using gauze sponges, not tissue or cotton balls. Peri-Wound  Care Triamcinolone 15 (g) Discharge Instruction: Use triamcinolone 15 (g) as directed Sween Lotion (Moisturizing lotion) Discharge Instruction: Apply moisturizing lotion as directed Topical Gentamicin Discharge Instruction: As directed by physician Mupirocin Ointment Discharge Instruction: Apply Mupirocin (Bactroban) as instructed Primary Dressing Promogran Prisma Matrix, 4.34 (sq in) (silver collagen) Discharge Instruction: Moisten collagen with saline or hydrogel  Secondary Dressing Woven Gauze Sponge, Non-Sterile 4x4 in Discharge Instruction: Apply over primary dressing as directed. Zetuvit Plus Silicone Border Dressing 4x4 (in/in) Discharge Instruction: Apply silicone border over primary dressing as directed. Zetuvit Plus Silicone Border Dressing 5x5 (in/in) Discharge Instruction: Apply silicone border over primary dressing as directed. Secured With Compression Wrap Compression Stockings Add-Ons Electronic Signature(s) Signed: 07/29/2022 5:05:32 PM By: Brenton Grills Entered By: Brenton Grills on 07/29/2022 13:42:24 MAURISA, TESMER ([id number]) [id number] Page 8 of 8 -------------------------------------------------------------------------------- Vitals Details Patient Name: Date of Service: Krista Waterflow, [name]. 07/29/2022 1:00 PM Medical Record Number: [id number] Patient Account Number: [id number] Date of Birth/Sex: Treating RN: 1945-11-03 (77 y.o. [name] Primary Care Kali Ambler: Ilda Basset NG, [place] Other Clinician: Referring Daneka Lantigua: Treating Genasis Zingale/Extender: Ramon Dredge in Treatment: 10 Vital Signs Time Taken: 13:24 Temperature (F): 98.5 Height (in): 63 Pulse (bpm): 62 Weight (lbs): 210 Respiratory Rate (breaths/min): 18 Body Mass Index (BMI): 37.2 Blood Pressure (mmHg): 146/83 Reference Range: 80 - 120 mg / dl Electronic Signature(s) Signed: 07/29/2022 5:05:32 PM By: Brenton Grills Entered By: Brenton Grills on 07/29/2022 13:34:11

## 2022-07-29 NOTE — Progress Notes (Signed)
Krista, [name] ([id number]) [id number] Page 1 of 8 Visit Report for 07/29/2022 Chief Complaint Document Details Patient Name: Date of Service: [place], [name]. 07/29/2022 1:00 PM Medical Record Number: [id number] Patient Account Number: [id number] Date of Birth/Sex: Treating RN: [birth year] (77 y.o. F) Primary Care Provider: [place] Blas, [place] [name] Other Clinician: Referring Provider: Treating Provider/Extender: [name]: 10 Information Obtained from: Patient Chief Complaint Patient seen for complaints of Non-Healing Wound. Electronic Signature(s) Signed: 07/29/2022 1:56:21 PM By: Krista Guess MD FACS Entered By: [name] on 07/29/2022 13:56:21 -------------------------------------------------------------------------------- Debridement Details Patient Name: Date of Service: [name]. 07/29/2022 1:00 PM Medical Record Number: [id number] Patient Account Number: [id number] Date of Birth/Sex: Treating RN: 08/16/1945 (77 y.o. [name] Primary Care Provider: Ilda Basset NG, [place] [name] Other Clinician: Referring Provider: Treating Provider/Extender: [name]: 10 Debridement Performed for Assessment: Wound #1 Left,Dorsal Foot Performed By: Physician Krista Guess, MD Debridement Type: Debridement Level of Consciousness (Pre-procedure): Awake and Alert Pre-procedure Verification/Time Out Yes - 13:50 Taken: Start Time: 13:52 Pain Control: Lidocaine 4% T opical Solution Percent of Wound Bed Debrided: 100% T Area Debrided (cm): otal 0.12 Tissue and other material debrided: Viable, Non-Viable, Slough, Slough Level: Non-Viable Tissue Debridement Description: Selective/Open Wound Instrument: Curette Bleeding: Minimum Hemostasis Achieved: Pressure End Time: 13:55 Procedural Pain: 0 Post Procedural Pain: 0 Response to [name]: Procedure was tolerated  well Level of Consciousness (Post- Awake and Alert procedure): Post Debridement Measurements of Total Wound Length: (cm) 0.5 Width: (cm) 0.3 Depth: (cm) 0.1 Volume: (cm) 0.012 Character of Wound/Ulcer Post Debridement: Improved [name], [name] ([id number]) [id number] Page 2 of 8 Post Procedure Diagnosis Same as Pre-procedure Notes Scribed for Dr [name] by Krista Grills RN Electronic Signature(s) Signed: 07/29/2022 2:30:17 PM By: Krista Guess MD FACS Signed: 07/29/2022 5:05:32 PM By: [name] Entered By: [name] on 07/29/2022 13:50:27 -------------------------------------------------------------------------------- HPI Details Patient Name: Date of Service: [name]. 07/29/2022 1:00 PM Medical Record Number: [id number] Patient Account Number: [id number] Date of Birth/Sex: Treating RN: 07-Jun-1945 (77 y.o. F) Primary Care Provider: [place] Blas, [place] [name] Other Clinician: Referring Provider: Treating Provider/Extender: [name]: 10 History of Present Illness HPI Description: ADMISSION 05/20/2022 This is a 77 year old woman with a chronic wound on her left foot. She has a remote history of trauma status post surgery and grafting many years ago. She reports that her dog stepped on her foot in December and the wound opened at that time. She was initially treated by her primary care doctor with mupirocin and a 10-day course of Bactrim for suspected cellulitis, back in February of this year. She is not diabetic and does not smoke. She was seen by Dr. Lajoyce Corners on March 26, 2022 and diagnosed with stasis dermatitis and chronic venous insufficiency. He prescribed compression stockings. His note makes no mention of the wound. She has not been wearing the compression stockings because she finds them difficult to apply. She reports wearing compression leggings, but is not wearing them today and cannot tell me  what degree of compression they provide. 05/29/2022: There is just 1 open area remaining on the dorsum of her foot. There is a little slough and eschar accumulation. She brought in her compression tights and although the degree of compression is not indicated, based on my own stretching and testing of the tensile strength, the best pair seems to be around 30 mmHg. She has some  that are a little less snug and I told her that I prefer her to use the tighter ones. Her venous reflux studies are scheduled for next Friday at 3 PM. 06/10/2022: She did not make it to her venous reflux studies last week. The wound on the dorsum of her foot remains open with a little slough and eschar accumulation. She is not wearing her compression garment. 07/01/2022: Her venous reflux studies were done and were negative. The wound on the dorsum of her foot is smaller with slough and eschar buildup again. She is wearing her compression garments. 07/15/2022: The wound measures smaller again today but does persist. There is a little bit of slough accumulation. No concern for infection. 07/29/2022: The wound is a little bit smaller again today. There is slough accumulation, but no other debris. Electronic Signature(s) Signed: 07/29/2022 1:56:57 PM By: Krista Guess MD FACS Entered By: [name] on 07/29/2022 13:56:57 -------------------------------------------------------------------------------- Physical Exam Details Patient Name: Date of Service: [name]. 07/29/2022 1:00 PM Medical Record Number: [id number] Patient Account Number: [id number] Date of Birth/Sex: Treating RN: 11/18/1945 (77 y.o. F) Primary Care Provider: [place] Blas, [place] [name] Other Clinician: Referring Provider: Treating Provider/Extender: [name]: [address], [name] ([id number]) [id number] Page 3 of 8 Constitutional Slightly hypertensive. . . . no acute  distress. Respiratory Normal work of breathing on room air. Notes 07/29/2022: The wound is a little bit smaller again today. There is slough accumulation, but no other debris. Electronic Signature(s) Signed: 07/29/2022 1:57:25 PM By: Krista Guess MD FACS Entered By: [name] on 07/29/2022 13:57:25 -------------------------------------------------------------------------------- Physician Orders Details Patient Name: Date of Service: [name]. 07/29/2022 1:00 PM Medical Record Number: [id number] Patient Account Number: [id number] Date of Birth/Sex: Treating RN: 1945-01-14 (77 y.o. [name] Primary Care Provider: [place] Blas, [place] [name] Other Clinician: Referring Provider: Treating Provider/Extender: [name]: 10 Verbal / Phone Orders: No Diagnosis Coding ICD-10 Coding Code Description [phone] Non-pressure chronic ulcer of other part of left foot with fat layer exposed I87.2 Venous insufficiency (chronic) (peripheral) I89.0 Lymphedema, not elsewhere classified Follow-up Appointments ppointment in 2 weeks. - Dr [name] Return A Anesthetic (In clinic) Topical Lidocaine 4% applied to wound bed Bathing/ Shower/ Hygiene May shower with protection but do not get wound dressing(s) wet. Protect dressing(s) with water repellant cover (for example, large plastic bag) or a cast cover and may then take shower. Edema Control - Lymphedema / SCD / Other Elevate legs to the level of the heart or above for 30 minutes daily and/or when sitting for 3-4 times a day throughout the day. Avoid standing for long periods of time. Patient to wear own compression stockings every day. - to right leg Moisturize legs daily. - Sween lotion Wound [name] Wound #1 - Foot Wound Laterality: Dorsal, Left Cleanser: Soap and Water 1 x Per Week/30 Days Discharge Instructions: May shower and wash wound with dial antibacterial soap and water prior to  dressing change. Cleanser: Wound Cleanser (Generic) 1 x Per Week/30 Days Discharge Instructions: Cleanse the wound with wound cleanser prior to applying a clean dressing using gauze sponges, not tissue or cotton balls. Peri-Wound Care: Triamcinolone 15 (g) (Generic) 1 x Per Week/30 Days Discharge Instructions: Use triamcinolone 15 (g) as directed Peri-Wound Care: Sween Lotion (Moisturizing lotion) (Generic) 1 x Per Week/30 Days Discharge Instructions: Apply moisturizing lotion as directed Topical: Gentamicin 1 x Per Week/30 Days Discharge Instructions: As directed by  physician Topical: Mupirocin Ointment 1 x Per Week/30 Days Discharge Instructions: Apply Mupirocin (Bactroban) as instructed [name], [name] ([id number]) [id number] Page 4 of 8 Prim Dressing: Promogran Prisma Matrix, 4.34 (sq in) (silver collagen) (Generic) 1 x Per Week/30 Days ary Discharge Instructions: Moisten collagen with saline or hydrogel Secondary Dressing: Woven Gauze Sponge, Non-Sterile 4x4 in (Generic) 1 x Per Week/30 Days Discharge Instructions: Apply over primary dressing as directed. Secondary Dressing: Zetuvit Plus Silicone Border Dressing 4x4 (in/in) (Generic) 1 x Per Week/30 Days Discharge Instructions: Apply silicone border over primary dressing as directed. Secondary Dressing: Zetuvit Plus Silicone Border Dressing 5x5 (in/in) (Generic) 1 x Per Week/30 Days Discharge Instructions: Apply silicone border over primary dressing as directed. Electronic Signature(s) Signed: 07/29/2022 2:30:17 PM By: Krista Guess MD FACS Entered By: [name] on 07/29/2022 13:59:07 -------------------------------------------------------------------------------- Problem List Details Patient Name: Date of Service: [name]. 07/29/2022 1:00 PM Medical Record Number: [id number] Patient Account Number: [id number] Date of Birth/Sex: Treating RN: 1945-10-13 (77 y.o. [name] Primary Care Provider: [place] Blas, [place] [name] Other Clinician: Referring Provider: Treating Provider/Extender: [name]: 10 Active Problems ICD-10 Encounter Code Description Active Date MDM Diagnosis L97.522 Non-pressure chronic ulcer of other part of left foot with fat layer exposed 05/20/2022 No Yes I87.2 Venous insufficiency (chronic) (peripheral) 05/20/2022 No Yes I89.0 Lymphedema, not elsewhere classified 05/20/2022 No Yes Inactive Problems Resolved Problems Electronic Signature(s) Signed: 07/29/2022 1:56:00 PM By: Krista Guess MD FACS Entered By: [name] on 07/29/2022 13:56:00 -------------------------------------------------------------------------------- Progress Note Details Patient Name: Date of Service: [name]. 07/29/2022 1:00 PM Medical Record Number: [id number] Patient Account Number: [id number] Date of Birth/Sex: Treating RN: 22-Aug-1945 (77 y.o. [name], [name] ([id number]) [id number] Page 5 of 8 Primary Care Provider: [place] Blas, [place] [name] Other Clinician: Referring Provider: Treating Provider/Extender: [name]: 10 Subjective Chief Complaint Information obtained from Patient Patient seen for complaints of Non-Healing Wound. History of Present Illness (HPI) ADMISSION 05/20/2022 This is a 77 year old woman with a chronic wound on her left foot. She has a remote history of trauma status post surgery and grafting many years ago. She reports that her dog stepped on her foot in December and the wound opened at that time. She was initially treated by her primary care doctor with mupirocin and a 10-day course of Bactrim for suspected cellulitis, back in February of this year. She is not diabetic and does not smoke. She was seen by Dr. Lajoyce Corners on March 26, 2022 and diagnosed with stasis dermatitis and chronic venous insufficiency. He prescribed  compression stockings. His note makes no mention of the wound. She has not been wearing the compression stockings because she finds them difficult to apply. She reports wearing compression leggings, but is not wearing them today and cannot tell me what degree of compression they provide. 05/29/2022: There is just 1 open area remaining on the dorsum of her foot. There is a little slough and eschar accumulation. She brought in her compression tights and although the degree of compression is not indicated, based on my own stretching and testing of the tensile strength, the best pair seems to be around 30 mmHg. She has some that are a little less snug and I told her that I prefer her to use the tighter ones. Her venous reflux studies are scheduled for next Friday at 3 PM. 06/10/2022: She did not make it to her venous reflux studies last week.  The wound on the dorsum of her foot remains open with a little slough and eschar accumulation. She is not wearing her compression garment. 07/01/2022: Her venous reflux studies were done and were negative. The wound on the dorsum of her foot is smaller with slough and eschar buildup again. She is wearing her compression garments. 07/15/2022: The wound measures smaller again today but does persist. There is a little bit of slough accumulation. No concern for infection. 07/29/2022: The wound is a little bit smaller again today. There is slough accumulation, but no other debris. Patient History Family History Cancer - Mother, Diabetes, Heart Disease, Hypertension, No family history of Hereditary Spherocytosis, Kidney Disease, Lung Disease, Seizures, Stroke, Thyroid Problems, Tuberculosis. Social History Never smoker, Marital Status - Divorced, Alcohol Use - Never, Drug Use - No History, Caffeine Use - Daily. Medical History Respiratory Patient has history of Asthma Cardiovascular Patient has history of Hypertension, Peripheral Venous Disease Musculoskeletal Patient  has history of Osteoarthritis Hospitalization/Surgery History - total hip arthroplasty. - elbow arthroscopy with tendon reconstruction. - foot surgery. Objective Constitutional Slightly hypertensive. no acute distress. Vitals Time Taken: 1:24 PM, Height: 63 in, Weight: 210 lbs, BMI: 37.2, Temperature: 98.5 F, Pulse: 62 bpm, Respiratory Rate: 18 breaths/min, Blood Pressure: 146/83 mmHg. Respiratory Normal work of breathing on room air. General Notes: 07/29/2022: The wound is a little bit smaller again today. There is slough accumulation, but no other debris. Integumentary (Hair, Skin) Wound #1 status is Open. Original cause of wound was Trauma. The date acquired was: 12/24/2021. The wound has been in [name] 10 weeks. The wound is located on the Left,Dorsal Foot. The wound measures 0.5cm length x 0.3cm width x 0.1cm depth; 0.118cm^2 area and 0.012cm^3 volume. There is Fat Layer (Subcutaneous Tissue) exposed. There is no tunneling or undermining noted. There is a medium amount of serous drainage noted. The wound margin is distinct with the outline attached to the wound base. There is small (1-33%) red granulation within the wound bed. There is a large (67-100%) amount of necrotic tissue within the wound bed. The periwound skin appearance had no abnormalities noted for color. The periwound skin appearance exhibited: Scarring, Dry/Scaly. Periwound temperature was noted as No Abnormality. [name], [name] ([id number]) [id number] Page 6 of 8 Assessment Active Problems ICD-10 Non-pressure chronic ulcer of other part of left foot with fat layer exposed Venous insufficiency (chronic) (peripheral) Lymphedema, not elsewhere classified Procedures Wound #1 Pre-procedure diagnosis of Wound #1 is a Lymphedema located on the Left,Dorsal Foot . There was a Selective/Open Wound Non-Viable Tissue Debridement with a total area of 0.12 sq cm performed by Krista Guess, MD.  With the following instrument(s): Curette to remove Viable and Non-Viable tissue/material. Material removed includes [facility] after achieving pain control using Lidocaine 4% Topical Solution. No specimens were taken. A time out was conducted at 13:50, prior to the start of the procedure. A Minimum amount of bleeding was controlled with Pressure. The procedure was tolerated well with a pain level of 0 throughout and a pain level of 0 following the procedure. Post Debridement Measurements: 0.5cm length x 0.3cm width x 0.1cm depth; 0.012cm^3 volume. Character of Wound/Ulcer Post Debridement is improved. Post procedure Diagnosis Wound #1: Same as Pre-Procedure General Notes: Scribed for Dr [name] by Krista Grills RN. Plan Follow-up Appointments: Return Appointment in 2 weeks. - Dr [name] Anesthetic: (In clinic) Topical Lidocaine 4% applied to wound bed Bathing/ Shower/ Hygiene: May shower with protection but do not get wound dressing(s) wet. Protect dressing(s) with water  repellant cover (for example, large plastic bag) or a cast cover and may then take shower. Edema Control - Lymphedema / SCD / Other: Elevate legs to the level of the heart or above for 30 minutes daily and/or when sitting for 3-4 times a day throughout the day. Avoid standing for long periods of time. Patient to wear own compression stockings every day. - to right leg Moisturize legs daily. - Sween lotion WOUND #1: - Foot Wound Laterality: Dorsal, Left Cleanser: Soap and Water 1 x Per Week/30 Days Discharge Instructions: May shower and wash wound with dial antibacterial soap and water prior to dressing change. Cleanser: Wound Cleanser (Generic) 1 x Per Week/30 Days Discharge Instructions: Cleanse the wound with wound cleanser prior to applying a clean dressing using gauze sponges, not tissue or cotton balls. Peri-Wound Care: Triamcinolone 15 (g) (Generic) 1 x Per Week/30 Days Discharge Instructions: Use triamcinolone 15 (g) as  directed Peri-Wound Care: Sween Lotion (Moisturizing lotion) (Generic) 1 x Per Week/30 Days Discharge Instructions: Apply moisturizing lotion as directed Topical: Gentamicin 1 x Per Week/30 Days Discharge Instructions: As directed by physician Topical: Mupirocin Ointment 1 x Per Week/30 Days Discharge Instructions: Apply Mupirocin (Bactroban) as instructed Prim Dressing: Promogran Prisma Matrix, 4.34 (sq in) (silver collagen) (Generic) 1 x Per Week/30 Days ary Discharge Instructions: Moisten collagen with saline or hydrogel Secondary Dressing: Woven Gauze Sponge, Non-Sterile 4x4 in (Generic) 1 x Per Week/30 Days Discharge Instructions: Apply over primary dressing as directed. Secondary Dressing: Zetuvit Plus Silicone Border Dressing 4x4 (in/in) (Generic) 1 x Per Week/30 Days Discharge Instructions: Apply silicone border over primary dressing as directed. Secondary Dressing: Zetuvit Plus Silicone Border Dressing 5x5 (in/in) (Generic) 1 x Per Week/30 Days Discharge Instructions: Apply silicone border over primary dressing as directed. 07/29/2022: The wound is a little bit smaller again today. There is slough accumulation, but no other debris. I used a curette to debride slough from her wound. Although there is no frank evidence of infection, I am going to add a mixture of topical gentamicin and mupirocin to the side to suppress normal skin flora to see if this helps move things along. Continue Prisma silver collagen. Follow-up in 2 weeks. Electronic Signature(s) Signed: 07/29/2022 1:59:47 PM By: Krista Guess MD FACS Entered By: [name] on 07/29/2022 13:59:46 [name], [name] ([id number]) [id number] Page 7 of 8 -------------------------------------------------------------------------------- HxROS Details Patient Name: Date of Service: Krista DGES, [name] [name]. 07/29/2022 1:00 PM Medical Record Number: [id number] Patient Account Number: [id number] Date of  Birth/Sex: Treating RN: 05-29-1945 (77 y.o. F) Primary Care Provider: [place] Blas, [place] [name] Other Clinician: Referring Provider: Treating Provider/Extender: [name]: 10 Respiratory Medical History: Positive for: Asthma Cardiovascular Medical History: Positive for: Hypertension; Peripheral Venous Disease Musculoskeletal Medical History: Positive for: Osteoarthritis Immunizations Pneumococcal Vaccine: Received Pneumococcal Vaccination: No Implantable Devices None Hospitalization / Surgery History Type of Hospitalization/Surgery total hip arthroplasty elbow arthroscopy with tendon reconstruction foot surgery Family and Social History Cancer: Yes - Mother; Diabetes: Yes; Heart Disease: Yes; Hereditary Spherocytosis: No; Hypertension: Yes; Kidney Disease: No; Lung Disease: No; Seizures: No; Stroke: No; Thyroid Problems: No; Tuberculosis: No; Never smoker; Marital Status - Divorced; Alcohol Use: Never; Drug Use: No History; Caffeine Use: Daily; Financial Concerns: No; Food, Clothing or Shelter Needs: No; Support System Lacking: No; Transportation Concerns: No Psychologist, prison and probation services) Signed: 07/29/2022 2:30:17 PM By: Krista Guess MD FACS Entered By: [name] on 07/29/2022 13:57:04 -------------------------------------------------------------------------------- SuperBill Details Patient Name: Date of Service: Krista DGES, [name]  [name]. 07/29/2022 Medical Record Number: [id number] Patient Account Number: [id number] Date of Birth/Sex: Treating RN: [birth year] (77 y.o. [name] Primary Care Provider: [place] Blas, [place] [name] Other Clinician: Referring Provider: Treating Provider/Extender: [name]: 10 Diagnosis Coding ICD-10 Codes Code Description [name], [name] ([id number]) [id number] Page 8 of 8 [phone] Non-pressure chronic ulcer of other part of left foot with fat layer  exposed I87.2 Venous insufficiency (chronic) (peripheral) I89.0 Lymphedema, not elsewhere classified Facility Procedures : 7 CPT4 Code: [id number] Description: 97597 - DEBRIDE WOUND 1ST 20 SQ CM OR < ICD-10 Diagnosis Description L97.522 Non-pressure chronic ulcer of other part of left foot with fat layer exposed Modifier: Quantity: 1 Physician Procedures : CPT4 Code Description Modifier [id number] - WC PHYS LEVEL 4 - EST PT 25 ICD-10 Diagnosis Description L97.522 Non-pressure chronic ulcer of other part of left foot with fat layer exposed I87.2 Venous insufficiency (chronic) (peripheral) I89.0  Lymphedema, not elsewhere classified Quantity: 1 : [id number] - WC PHYS DEBR WO ANESTH 20 SQ CM ICD-10 Diagnosis Description L97.522 Non-pressure chronic ulcer of other part of left foot with fat layer exposed Quantity: 1 Electronic Signature(s) Signed: 07/29/2022 2:00:05 PM By: Krista Guess MD FACS Entered By: [name] on 07/29/2022 14:00:05

## 2022-08-10 ENCOUNTER — Encounter (HOSPITAL_BASED_OUTPATIENT_CLINIC_OR_DEPARTMENT_OTHER): Payer: Medicare Other | Attending: General Surgery | Admitting: General Surgery

## 2022-08-10 DIAGNOSIS — L97522 Non-pressure chronic ulcer of other part of left foot with fat layer exposed: Secondary | ICD-10-CM | POA: Diagnosis present

## 2022-08-10 DIAGNOSIS — M1990 Unspecified osteoarthritis, unspecified site: Secondary | ICD-10-CM | POA: Diagnosis not present

## 2022-08-10 DIAGNOSIS — I872 Venous insufficiency (chronic) (peripheral): Secondary | ICD-10-CM | POA: Insufficient documentation

## 2022-08-10 DIAGNOSIS — J45909 Unspecified asthma, uncomplicated: Secondary | ICD-10-CM | POA: Diagnosis not present

## 2022-08-10 DIAGNOSIS — I10 Essential (primary) hypertension: Secondary | ICD-10-CM | POA: Insufficient documentation

## 2022-08-10 DIAGNOSIS — I890 Lymphedema, not elsewhere classified: Secondary | ICD-10-CM | POA: Diagnosis not present

## 2022-08-10 NOTE — Progress Notes (Signed)
[name] ([id number]) [id number] Page 1 of 8 Visit Report for 08/10/2022 Chief Complaint Document Details Patient Name: Date of Service: [place], [name] 08/10/2022 1:45 PM Medical Record Number: [id number] Patient Account Number: [id number] Date of Birth/Sex: Treating RN: Sep 29, 1945 (77 y.o. F) Primary Care Provider: Theodore Blas, [place] NC[facility] Other Clinician: Referring Provider: Treating Provider/Extender: Ramon Dredge in Treatment: 11 Information Obtained from: Patient Chief Complaint Patient seen for complaints of Non-Healing Wound. Electronic Signature(s) Signed: 08/10/2022 2:11:37 PM By: Duanne Guess MD FACS Entered By: Duanne Guess on 08/10/2022 14:11:37 -------------------------------------------------------------------------------- Debridement Details Patient Name: Date of Service: Krista DGES, [name] 08/10/2022 1:45 PM Medical Record Number: [id number] Patient Account Number: [id number] Date of Birth/Sex: Treating RN: May 11, 1945 (77 y.o. Fredderick Phenix Primary Care Provider: Ilda Basset NG, [place] [facility] Other Clinician: Referring Provider: Treating Provider/Extender: Ramon Dredge in Treatment: 11 Debridement Performed for Assessment: Wound #1 Left,Dorsal Foot Performed By: Physician Duanne Guess, MD Debridement Type: Debridement Level of Consciousness (Pre-procedure): Awake and Alert Pre-procedure Verification/Time Out Yes - 14:01 Taken: Start Time: 14:01 Pain Control: Lidocaine 5% topical ointment Percent of Wound Bed Debrided: 100% T Area Debrided (cm): otal 0.2 Tissue and other material debrided: Non-Viable, Eschar, Slough, Slough Level: Non-Viable Tissue Debridement Description: Selective/Open Wound Instrument: Curette Bleeding: Minimum Hemostasis Achieved: Pressure Response to Treatment: Procedure was tolerated well Level of Consciousness (Post- Awake and Alert procedure): Post  Debridement Measurements of Total Wound Length: (cm) 0.5 Width: (cm) 0.5 Depth: (cm) 0.1 Volume: (cm) 0.02 Character of Wound/Ulcer Post Debridement: Improved Post Procedure Diagnosis Same as Pre-procedure [name] ([id number]) [id number] Page 2 of 8 Notes [name], RN Electronic Signature(s) Signed: 08/10/2022 2:17:49 PM By: Duanne Guess MD FACS Signed: 08/10/2022 4:45:47 PM By: Gelene Mink By: Samuella Bruin on 08/10/2022 14:03:23 -------------------------------------------------------------------------------- HPI Details Patient Name: Date of Service: Krista DGES, [name] 08/10/2022 1:45 PM Medical Record Number: [id number] Patient Account Number: [id number] Date of Birth/Sex: Treating RN: 1945-09-08 (77 y.o. F) Primary Care Provider: [place] Blas, [place] [facility] Other Clinician: Referring Provider: Treating Provider/Extender: Ramon Dredge in Treatment: 11 History of Present Illness HPI Description: ADMISSION 05/20/2022 This is a 77 year old woman with a chronic wound on her left foot. She has a remote history of trauma status post surgery and grafting many years ago. She reports that her dog stepped on her foot in December and the wound opened at that time. She was initially treated by her primary care doctor with mupirocin and a 10-day course of Bactrim for suspected cellulitis, back in February of this year. She is not diabetic and does not smoke. She was seen by Dr. Lajoyce Corners on March 26, 2022 and diagnosed with stasis dermatitis and chronic venous insufficiency. He prescribed compression stockings. His note makes no mention of the wound. She has not been wearing the compression stockings because she finds them difficult to apply. She reports wearing compression leggings, but is not wearing them today and cannot tell me what degree of compression they provide. 05/29/2022: There is  just 1 open area remaining on the dorsum of her foot. There is a little slough and eschar accumulation. She brought in her compression tights and although the degree of compression is not indicated, based on my own stretching and testing of the tensile strength, the best pair seems to be around 30 mmHg. She has some that are a little less snug and I told her that  I prefer her to use the tighter ones. Her venous reflux studies are scheduled for next Friday at 3 PM. 06/10/2022: She did not make it to her venous reflux studies last week. The wound on the dorsum of her foot remains open with a little slough and eschar accumulation. She is not wearing her compression garment. 07/01/2022: Her venous reflux studies were done and were negative. The wound on the dorsum of her foot is smaller with slough and eschar buildup again. She is wearing her compression garments. 07/15/2022: The wound measures smaller again today but does persist. There is a little bit of slough accumulation. No concern for infection. 07/29/2022: The wound is a little bit smaller again today. There is slough accumulation, but no other debris. 08/10/2022: No change to the wound dimensions today. There is slough and eschar accumulation. Electronic Signature(s) Signed: 08/10/2022 2:12:05 PM By: Duanne Guess MD FACS Entered By: Duanne Guess on 08/10/2022 14:12:04 -------------------------------------------------------------------------------- Physical Exam Details Patient Name: Date of Service: Krista DGES, [name] 08/10/2022 1:45 PM Medical Record Number: [id number] Patient Account Number: [id number] Date of Birth/Sex: Treating RN: 09/15/1945 (77 y.o. F) Primary Care Provider: [place] Blas, [place] [facility] Other Clinician: Referring Provider: Treating Provider/Extender: Ramon Dredge in Treatment: 11 Constitutional Hypertensive, asymptomatic. . . . no acute distress. [name] ([id number])  [id number] Page 3 of 8 Respiratory Normal work of breathing on room air. Notes 08/10/2022: No change to the wound dimensions today. There is slough and eschar accumulation. Electronic Signature(s) Signed: 08/10/2022 2:12:34 PM By: Duanne Guess MD FACS Entered By: Duanne Guess on 08/10/2022 14:12:34 -------------------------------------------------------------------------------- Physician Orders Details Patient Name: Date of Service: Krista DGES, [name] 08/10/2022 1:45 PM Medical Record Number: [id number] Patient Account Number: [id number] Date of Birth/Sex: Treating RN: November 12, 1945 (77 y.o. Fredderick Phenix Primary Care Provider: [place] Blas, [place] [facility] Other Clinician: Referring Provider: Treating Provider/Extender: Ramon Dredge in Treatment: 11 Verbal / Phone Orders: No Diagnosis Coding ICD-10 Coding Code Description [phone] Non-pressure chronic ulcer of other part of left foot with fat layer exposed I87.2 Venous insufficiency (chronic) (peripheral) I89.0 Lymphedema, not elsewhere classified Follow-up Appointments ppointment in 2 weeks. - Dr Lady Gary - room 3 Return A Anesthetic (In clinic) Topical Lidocaine 5% applied to wound bed Bathing/ Shower/ Hygiene May shower and wash wound with soap and water. Edema Control - Lymphedema / SCD / Other Elevate legs to the level of the heart or above for 30 minutes daily and/or when sitting for 3-4 times a day throughout the day. Avoid standing for long periods of time. Patient to wear own compression stockings every day. - to right leg Moisturize legs daily. - Sween lotion Wound Treatment Wound #1 - Foot Wound Laterality: Dorsal, Left Cleanser: Soap and Water 1 x Per Day/30 Days Discharge Instructions: May shower and wash wound with dial antibacterial soap and water prior to dressing change. Cleanser: Wound Cleanser (Generic) 1 x Per Day/30 Days Discharge Instructions: Cleanse the  wound with wound cleanser prior to applying a clean dressing using gauze sponges, not tissue or cotton balls. Peri-Wound Care: Triamcinolone 15 (g) (Generic) 1 x Per Day/30 Days Discharge Instructions: Use triamcinolone 15 (g) as directed Peri-Wound Care: Sween Lotion (Moisturizing lotion) (Generic) 1 x Per Day/30 Days Discharge Instructions: Apply moisturizing lotion as directed Topical: Gentamicin 1 x Per Day/30 Days Discharge Instructions: As directed by physician Topical: Mupirocin Ointment 1 x Per Day/30 Days Discharge Instructions: Apply Mupirocin (Bactroban) as instructed Prim Dressing: Promogran Prisma  Matrix, 4.34 (sq in) (silver collagen) (Generic) 1 x Per Day/30 Days ary Discharge Instructions: Moisten collagen with saline or hydrogel Secondary Dressing: Zetuvit Plus Silicone Border Dressing 4x4 (in/in) (Generic) 1 x Per Day/30 Days DRU, VERDOORN ([id number]) [id number] Page 4 of 8 Discharge Instructions: Apply silicone border over primary dressing as directed. Patient Medications llergies: morphine, codeine, tetracycline, Keflex A Notifications Medication Indication Start End 08/10/2022 lidocaine DOSE topical 5 % ointment - ointment topical Electronic Signature(s) Signed: 08/10/2022 2:17:49 PM By: Duanne Guess MD FACS Entered By: Duanne Guess on 08/10/2022 14:15:57 -------------------------------------------------------------------------------- Problem List Details Patient Name: Date of Service: Krista DGES, [name] 08/10/2022 1:45 PM Medical Record Number: [id number] Patient Account Number: [id number] Date of Birth/Sex: Treating RN: 12/01/1945 (77 y.o. 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 08/10/2022 1:45 PM Medical Record Number: [id number] Patient Account Number: [id number] Date of Birth/Sex: Treating RN: 27-Oct-1945 (77 y.o. F) Primary Care Provider: [place] Blas, [place] [facility] Other ClinicianSTEFANEE, GRIGAS ([id number]) [id number] Page 5 of 8 Referring Provider: Treating Provider/Extender: Ramon Dredge in Treatment: 11 Subjective Chief Complaint Information obtained from Patient Patient seen for complaints of Non-Healing Wound. History of Present Illness (HPI) ADMISSION 05/20/2022 This is a 78 year old woman with a chronic wound on her left foot. She has a remote history of trauma status post surgery and grafting many years ago. She reports that her dog stepped on her foot in December and the wound opened at that time. She was initially treated by her primary care doctor with mupirocin and a 10-day course of Bactrim for suspected cellulitis, back in February of this year. She is not diabetic and does not smoke. She was seen by Dr. Lajoyce Corners on March 26, 2022 and diagnosed with stasis dermatitis and chronic venous insufficiency. He prescribed compression stockings. His note makes no mention of the wound. She has not been wearing the compression stockings because she finds them difficult to apply. She reports wearing compression leggings, but is not wearing them today and cannot tell me what degree of compression they  provide. 05/29/2022: There is just 1 open area remaining on the dorsum of her foot. There is a little slough and eschar accumulation. She brought in her compression tights and although the degree of compression is not indicated, based on my own stretching and testing of the tensile strength, the best pair seems to be around 30 mmHg. She has some that are a little less snug and I told her that I prefer her to use the tighter ones. Her venous reflux studies are scheduled for next Friday at 3 PM. 06/10/2022: She did not make it to her venous reflux studies last week. The wound on the dorsum of her foot remains open with a little slough and eschar accumulation. She is not wearing her compression garment. 07/01/2022: Her venous reflux studies were done and were negative. The wound on the dorsum of her foot is smaller with slough  and eschar buildup again. She is wearing her compression garments. 07/15/2022: The wound measures smaller again today but does persist. There is a little bit of slough accumulation. No concern for infection. 07/29/2022: The wound is a little bit smaller again today. There is slough accumulation, but no other debris. 08/10/2022: No change to the wound dimensions today. There is slough and eschar accumulation. Patient History Family History Cancer - Mother, Diabetes, Heart Disease, Hypertension, No family history of Hereditary Spherocytosis, Kidney Disease, Lung Disease, Seizures, Stroke, Thyroid Problems, Tuberculosis. Social History Never smoker, Marital Status - Divorced, Alcohol Use - Never, Drug Use - No History, Caffeine Use - Daily. Medical History Respiratory Patient has history of Asthma Cardiovascular Patient has history of Hypertension, Peripheral Venous Disease Musculoskeletal Patient has history of Osteoarthritis Hospitalization/Surgery History - total hip arthroplasty. - elbow arthroscopy with tendon reconstruction. - foot  surgery. Objective Constitutional Hypertensive, asymptomatic. no acute distress. Vitals Time Taken: 1:55 PM, Height: 63 in, Weight: 210 lbs, BMI: 37.2, Temperature: 97.5 F, Pulse: 72 bpm, Respiratory Rate: 18 breaths/min, Blood Pressure: 152/80 mmHg. Respiratory Normal work of breathing on room air. General Notes: 08/10/2022: No change to the wound dimensions today. There is slough and eschar accumulation. Integumentary (Hair, Skin) Wound #1 status is Open. Original cause of wound was Trauma. The date acquired was: 12/24/2021. The wound has been in treatment 11 weeks. The wound is located on the Left,Dorsal Foot. The wound measures 0.5cm length x 0.5cm width x 0.1cm depth; 0.196cm^2 area and 0.02cm^3 volume. There is Fat Layer (Subcutaneous Tissue) exposed. There is no tunneling or undermining noted. There is a medium amount of serosanguineous drainage noted. The wound margin is distinct with the outline attached to the wound base. There is small (1-33%) red granulation within the wound bed. There is a large (67-100%) amount of necrotic tissue within the wound bed including Eschar and Adherent Slough. The periwound skin appearance had no abnormalities noted for color. The periwound skin appearance exhibited: Scarring, Dry/Scaly. Periwound temperature was noted as No Abnormality. JANESSE, START ([id number]) [id number] Page 6 of 8 Assessment Active Problems ICD-10 Non-pressure chronic ulcer of other part of left foot with fat layer exposed Venous insufficiency (chronic) (peripheral) Lymphedema, not elsewhere classified Procedures Wound #1 Pre-procedure diagnosis of Wound #1 is a Lymphedema located on the Left,Dorsal Foot . There was a Selective/Open Wound Non-Viable Tissue Debridement with a total area of 0.2 sq cm performed by Duanne Guess, MD. With the following instrument(s): Curette to remove Non-Viable tissue/material. Material removed includes Eschar  and Slough and after achieving pain control using Lidocaine 5% topical ointment. No specimens were taken. A time out was conducted at 14:01, prior to the start of the procedure. A Minimum amount of bleeding was controlled with Pressure. The procedure was tolerated well. Post Debridement Measurements: 0.5cm length x 0.5cm width x 0.1cm depth; 0.02cm^3 volume. Character of Wound/Ulcer Post Debridement is improved. Post procedure Diagnosis Wound #1: Same as Pre-Procedure General Notes: [name], RN. Plan Follow-up Appointments: Return Appointment in 2 weeks. - Dr Lady Gary - room 3 Anesthetic: (In clinic) Topical Lidocaine 5% applied to wound bed Bathing/ Shower/ Hygiene: May shower and wash wound with soap and water. Edema Control - Lymphedema / SCD / Other: Elevate legs to the level of the heart or above for 30 minutes daily and/or when sitting for 3-4 times a day throughout the day. Avoid standing for long periods of time. Patient to wear own compression stockings every day. -  to right leg Moisturize legs daily. - Sween lotion The following medication(s) was prescribed: lidocaine topical 5 % ointment ointment topical was prescribed at facility WOUND #1: - Foot Wound Laterality: Dorsal, Left Cleanser: Soap and Water 1 x Per Day/30 Days Discharge Instructions: May shower and wash wound with dial antibacterial soap and water prior to dressing change. Cleanser: Wound Cleanser (Generic) 1 x Per Day/30 Days Discharge Instructions: Cleanse the wound with wound cleanser prior to applying a clean dressing using gauze sponges, not tissue or cotton balls. Peri-Wound Care: Triamcinolone 15 (g) (Generic) 1 x Per Day/30 Days Discharge Instructions: Use triamcinolone 15 (g) as directed Peri-Wound Care: Sween Lotion (Moisturizing lotion) (Generic) 1 x Per Day/30 Days Discharge Instructions: Apply moisturizing lotion as directed Topical: Gentamicin 1 x Per Day/30  Days Discharge Instructions: As directed by physician Topical: Mupirocin Ointment 1 x Per Day/30 Days Discharge Instructions: Apply Mupirocin (Bactroban) as instructed Prim Dressing: Promogran Prisma Matrix, 4.34 (sq in) (silver collagen) (Generic) 1 x Per Day/30 Days ary Discharge Instructions: Moisten collagen with saline or hydrogel Secondary Dressing: Zetuvit Plus Silicone Border Dressing 4x4 (in/in) (Generic) 1 x Per Day/30 Days Discharge Instructions: Apply silicone border over primary dressing as directed. 08/10/2022: No change to the wound dimensions today. There is slough and eschar accumulation. I used a curette to debride slough and eschar from the wound. Will continue using the mixture of topical gentamicin and mupirocin with Prisma silver collagen. The patient has been changing the dressing about every 2 to 3 days but so that she could change it daily if needed. I have asked her to do so to see if this makes any difference in her hearing. Follow-up in 2 weeks. Electronic Signature(s) Signed: 08/10/2022 2:16:42 PM By: Duanne Guess MD FACS Entered By: Duanne Guess on 08/10/2022 14:16:42 Lattie Corns ([id number]) [id number] Page 7 of 8 -------------------------------------------------------------------------------- HxROS Details Patient Name: Date of Service: Krista DGES, [name]. 08/10/2022 1:45 PM Medical Record Number: [id number] Patient Account Number: [id number] Date of Birth/Sex: Treating RN: 06-Mar-1945 (77 y.o. F) Primary Care Provider: [place] Blas, [place] [facility] Other Clinician: Referring Provider: Treating Provider/Extender: Ramon Dredge in Treatment: 11 Respiratory Medical History: Positive for: Asthma Cardiovascular Medical History: Positive for: Hypertension; Peripheral Venous Disease Musculoskeletal Medical History: Positive for: Osteoarthritis Immunizations Pneumococcal Vaccine: Received Pneumococcal  Vaccination: No Implantable Devices None Hospitalization / Surgery History Type of Hospitalization/Surgery total hip arthroplasty elbow arthroscopy with tendon reconstruction foot surgery Family and Social History Cancer: Yes - Mother; Diabetes: Yes; Heart Disease: Yes; Hereditary Spherocytosis: No; Hypertension: Yes; Kidney Disease: No; Lung Disease: No; Seizures: No; Stroke: No; Thyroid Problems: No; Tuberculosis: No; Never smoker; Marital Status - Divorced; Alcohol Use: Never; Drug Use: No History; Caffeine Use: Daily; Financial Concerns: No; Food, Clothing or Shelter Needs: No; Support System Lacking: No; Transportation Concerns: No Electronic Signature(s) Signed: 08/10/2022 2:17:49 PM By: Duanne Guess MD FACS Entered By: Duanne Guess on 08/10/2022 14:12:12 -------------------------------------------------------------------------------- SuperBill Details Patient Name: Date of Service: Krista DGES, [name] 08/10/2022 Medical Record Number: [id number] Patient Account Number: [id number] Date of Birth/Sex: Treating RN: 1945-04-25 (77 y.o. F) Primary Care Provider: [place] Blas, [place] [facility] Other Clinician: Referring Provider: Treating Provider/Extender: Ramon Dredge in Treatment: 11 Diagnosis Coding ICD-10 Codes Code Description CHEQUITA, WARLEY ([id number]) [id number] Page 8 of 8 [phone] Non-pressure chronic ulcer of other part of left foot with fat layer exposed I87.2 Venous insufficiency (chronic) (peripheral) I89.0 Lymphedema, not elsewhere classified Facility Procedures : 7 CPT4  Code: 5956387 Description: 97597 - DEBRIDE WOUND 1ST 20 SQ CM OR < ICD-10 Diagnosis Description L97.522 Non-pressure chronic ulcer of other part of left foot with fat layer exposed Modifier: Quantity: 1 Physician Procedures : CPT4 Code Description Modifier [id number] - WC PHYS LEVEL 4 - EST PT 25 ICD-10 Diagnosis Description L97.522 Non-pressure  chronic ulcer of other part of left foot with fat layer exposed I87.2 Venous insufficiency (chronic) (peripheral) I89.0  Lymphedema, not elsewhere classified Quantity: 1 : [id number] 97597 - WC PHYS DEBR WO ANESTH 20 SQ CM ICD-10 Diagnosis Description L97.522 Non-pressure chronic ulcer of other part of left foot with fat layer exposed Quantity: 1 Electronic Signature(s) Signed: 08/10/2022 2:17:13 PM By: Duanne Guess MD FACS Entered By: Duanne Guess on 08/10/2022 14:17:12

## 2022-08-10 NOTE — Progress Notes (Signed)
VADIE, PEARL ([id number]) [id number] Page 1 of 7 Visit Report for 08/10/2022 Arrival Information Details Patient Name: Date of Service: [place], [name] 08/10/2022 1:45 PM Medical Record Number: [id number] Patient Account Number: [id number] Date of Birth/Sex: Treating RN: Oct 05, 1945 (77 y.o. Fredderick Phenix Primary Care Zanya Lindo: [place] Blas, [place] NCIS Other Clinician: Referring Apple Dearmas: Treating Rachal Dvorsky/Extender: Ramon Dredge in Treatment: 11 Visit Information History Since Last Visit Added or deleted any medications: No Patient Arrived: Cane Any new allergies or adverse reactions: No Arrival Time: 13:53 Had a fall or experienced change in No Accompanied By: self activities of daily living that may affect Transfer Assistance: None risk of falls: Patient Identification Verified: Yes Signs or symptoms of abuse/neglect since last visito No Secondary Verification Process Completed: Yes Hospitalized since last visit: No Patient Requires Transmission-Based Precautions: No Implantable device outside of the clinic excluding No Patient Has Alerts: No cellular tissue based products placed in the center since last visit: Has Dressing in Place as Prescribed: Yes Pain Present Now: No Electronic Signature(s) Signed: 08/10/2022 4:45:47 PM By: Samuella Bruin Entered By: Samuella Bruin on 08/10/2022 13:54:08 -------------------------------------------------------------------------------- Encounter Discharge Information Details Patient Name: Date of Service: [name], [name] 08/10/2022 1:45 PM Medical Record Number: [id number] Patient Account Number: [id number] Date of Birth/Sex: Treating RN: April 24, 1945 (77 y.o. Fredderick Phenix Primary Care Colum Colt: [place], [place] Other Clinician: Referring Tregan Read: Treating Adyn Hoes/Extender: Ramon Dredge in Treatment: 11 Encounter Discharge Information Items  Post Procedure Vitals Discharge Condition: Stable Temperature (F): 97.5 Ambulatory Status: Ambulatory Pulse (bpm): 72 Discharge Destination: Home Respiratory Rate (breaths/min): 18 Transportation: Private Auto Blood Pressure (mmHg): 152/80 Accompanied By: self Schedule Follow-up Appointment: Yes Clinical Summary of Care: Patient Declined Electronic Signature(s) Signed: 08/10/2022 4:45:47 PM By: Samuella Bruin Entered By: Samuella Bruin on 08/10/2022 14:09:52 Rathert, Mikael C ([id number]) [id number] Page 2 of 7 -------------------------------------------------------------------------------- Lower Extremity Assessment Details Patient Name: Date of Service: HO [place], [name] 08/10/2022 1:45 PM Medical Record Number: [id number] Patient Account Number: [id number] Date of Birth/Sex: Treating RN: Sep 07, 1945 (77 y.o. Fredderick Phenix Primary Care Rosine Solecki: [place], [place] NCIS Other Clinician: Referring Halaina Vanduzer: Treating Parvin Stetzer/Extender: Ramon Dredge in Treatment: 11 Edema Assessment Assessed: [Left: No] [Right: No] [Left: Edema] [Right: :] Calf Left: Right: Point of Measurement: From Medial Instep 45.1 cm Ankle Left: Right: Point of Measurement: From Medial Instep 24.3 cm Vascular Assessment Extremity colors, hair growth, and conditions: Hair Growth on Extremity: [Left:Yes] Temperature of Extremity: [Left:Warm] Capillary Refill: [Left:< 3 seconds] Dependent Rubor: [Left:No No] Electronic Signature(s) Signed: 08/10/2022 4:45:47 PM By: Samuella Bruin Entered By: Samuella Bruin on 08/10/2022 13:55:35 -------------------------------------------------------------------------------- Multi Wound Chart Details Patient Name: Date of Service: [name], [name] 08/10/2022 1:45 PM Medical Record Number: [id number] Patient Account Number: [id number] Date of Birth/Sex: Treating RN: Dec 25, 1945 (77 y.o. F) Primary Care  Clarion Mooneyhan: [place], [place] NCIS Other Clinician: Referring Harleigh Civello: Treating Cacie Gaskins/Extender: Ramon Dredge in Treatment: 11 Vital Signs Height(in): 63 Pulse(bpm): 72 Weight(lbs): 210 Blood Pressure(mmHg): 152/80 Body Mass Index(BMI): 37.2 Temperature(F): 97.5 Respiratory Rate(breaths/min): 18 [1:Photos:] [N/A:N/A] Left, Dorsal Foot N/A N/A Wound Location: Trauma N/A N/A Wounding Event: Lymphedema N/A N/A Primary Etiology: Asthma, Hypertension, Peripheral N/A N/A Comorbid History: Venous Disease, Osteoarthritis 12/24/2021 N/A N/A Date Acquired: 11 N/A N/A Weeks of Treatment: Open N/A N/A Wound Status: No N/A N/A Wound Recurrence: 0.5x0.5x0.1 N/A N/A Measurements L x W x D (cm) 0.196 N/A N/A A (  cm) : rea 0.02 N/A N/A Volume (cm) : 70.30% N/A N/A % Reduction in A rea: 84.80% N/A N/A % Reduction in Volume: Full Thickness Without Exposed N/A N/A Classification: Support Structures Medium N/A N/A Exudate A mount: Serosanguineous N/A N/A Exudate Type: red, brown N/A N/A Exudate Color: Distinct, outline attached N/A N/A Wound Margin: Small (1-33%) N/A N/A Granulation A mount: Red N/A N/A Granulation Quality: Large (67-100%) N/A N/A Necrotic A mount: Eschar, Adherent Slough N/A N/A Necrotic Tissue: Fat Layer (Subcutaneous Tissue): Yes N/A N/A Exposed Structures: Fascia: No Tendon: No Muscle: No Joint: No Bone: No Medium (34-66%) N/A N/A Epithelialization: Debridement - Selective/Open Wound N/A N/A Debridement: Pre-procedure Verification/Time Out 14:01 N/A N/A Taken: Lidocaine 5% topical ointment N/A N/A Pain Control: Necrotic/Eschar, Slough N/A N/A Tissue Debrided: Non-Viable Tissue N/A N/A Level: 0.2 N/A N/A Debridement A (sq cm): rea Curette N/A N/A Instrument: Minimum N/A N/A Bleeding: Pressure N/A N/A Hemostasis A chieved: Procedure was tolerated well N/A N/A Debridement Treatment Response: 0.5x0.5x0.1 N/A  N/A Post Debridement Measurements L x W x D (cm) 0.02 N/A N/A Post Debridement Volume: (cm) Scarring: Yes N/A N/A Periwound Skin Texture: Dry/Scaly: Yes N/A N/A Periwound Skin Moisture: No Abnormalities Noted N/A N/A Periwound Skin Color: No Abnormality N/A N/A Temperature: Debridement N/A N/A Procedures Performed: Treatment Notes Wound #1 (Foot) Wound Laterality: Dorsal, Left Cleanser Soap and Water Discharge Instruction: May shower and wash wound with dial antibacterial soap and water prior to dressing change. Wound Cleanser Discharge Instruction: Cleanse the wound with wound cleanser prior to applying a clean dressing using gauze sponges, not tissue or cotton balls. Peri-Wound Care Triamcinolone 15 (g) Discharge Instruction: Use triamcinolone 15 (g) as directed Sween Lotion (Moisturizing lotion) Discharge Instruction: Apply moisturizing lotion as directed Topical Gentamicin Discharge Instruction: As directed by physician Mupirocin Ointment Discharge Instruction: Apply Mupirocin (Bactroban) as instructed Primary Dressing Promogran Prisma Matrix, 4.34 (sq in) (silver collagen) Discharge Instruction: Moisten collagen with saline or hydrogel Oriol, Karaline C ([id number]) [id number] Page 4 of 7 Secondary Dressing Zetuvit Plus Silicone Border Dressing 4x4 (in/in) Discharge Instruction: Apply silicone border over primary dressing as directed. Secured With Compression Wrap Compression Stockings Facilities manager) Signed: 08/10/2022 2:11:30 PM By: Duanne Guess MD FACS Entered By: Duanne Guess on 08/10/2022 14:11:30 -------------------------------------------------------------------------------- Multi-Disciplinary Care Plan Details Patient Name: Date of Service: [name], [name] 08/10/2022 1:45 PM Medical Record Number: [id number] Patient Account Number: [id number] Date of Birth/Sex: Treating RN: Apr 10, 1945 (78 y.o. Fredderick Phenix Primary Care Karrington Studnicka: [place], [place] NCIS Other Clinician: Referring Bertel Venard: Treating Deona Novitski/Extender: Ramon Dredge in Treatment: 11 Active Inactive Wound/Skin Impairment Nursing Diagnoses: Impaired tissue integrity Knowledge deficit related to ulceration/compromised skin integrity Goals: Patient/caregiver will verbalize understanding of skin care regimen Date Initiated: 05/20/2022 Target Resolution Date: 09/04/2022 Goal Status: Active Interventions: Assess ulceration(s) every visit Treatment Activities: Skin care regimen initiated : 05/20/2022 Topical wound management initiated : 05/20/2022 Notes: Electronic Signature(s) Signed: 08/10/2022 4:45:47 PM By: Samuella Bruin Entered By: Samuella Bruin on 08/10/2022 14:03:38 -------------------------------------------------------------------------------- Pain Assessment Details Patient Name: Date of Service: [name], [name] 08/10/2022 1:45 PM Medical Record Number: [id number] Patient Account Number: [id number] Date of Birth/Sex: Treating RN: 12-07-1945 (77 y[address]o. [address] [place], Cadi C ([id number]) [id number] Page 5 of 7 Primary Care Sharlon Pfohl: [place], [place] Other Clinician: Referring Delante Karapetyan: Treating Gracieann Stannard/Extender: Ramon Dredge in Treatment: 11 Active Problems Location of Pain Severity and Description of Pain Patient Has Paino No Site  Locations Rate the pain. Current Pain Level: 0 Pain Management and Medication Current Pain Management: Electronic Signature(s) Signed: 08/10/2022 4:45:47 PM By: Samuella Bruin Entered By: Samuella Bruin on 08/10/2022 13:55:27 -------------------------------------------------------------------------------- Patient/Caregiver Education Details Patient Name: Date of Service: [name], [name] Salena Saner 8/5/2024andnbsp1:45 PM Medical Record Number: [id number] Patient Account  Number: [id number] Date of Birth/Gender: Treating RN: [birth year] (77 y.o. Fredderick Phenix Primary Care Physician: [place], [place] Other Clinician: Referring Physician: Treating Physician/Extender: Ramon Dredge in Treatment: 11 Education Assessment Education Provided To: Patient Education Topics Provided Wound/Skin Impairment: Methods: Explain/Verbal Responses: Reinforcements needed, State content correctly Electronic Signature(s) Signed: 08/10/2022 4:45:47 PM By: Samuella Bruin Entered By: Samuella Bruin on 08/10/2022 14:03:48 Ortez, Annalynne C ([id number]) [id number] Page 6 of 7 -------------------------------------------------------------------------------- Wound Assessment Details Patient Name: Date of Service: HO [place], [name] 08/10/2022 1:45 PM Medical Record Number: [id number] Patient Account Number: [id number] Date of Birth/Sex: Treating RN: 06-11-1945 (77 y.o. Fredderick Phenix Primary Care Whitten Andreoni: Ilda Basset NG, [place] Other Clinician: Referring Deziree Mokry: Treating Hoang Pettingill/Extender: Ramon Dredge in Treatment: 11 Wound Status Wound Number: 1 Primary Lymphedema Etiology: Wound Location: Left, Dorsal Foot Wound Status: Open Wounding Event: Trauma Comorbid Asthma, Hypertension, Peripheral Venous Disease, Date Acquired: 12/24/2021 History: Osteoarthritis Weeks Of Treatment: 11 Clustered Wound: No Photos Wound Measurements Length: (cm) 0.5 Width: (cm) 0.5 Depth: (cm) 0.1 Area: (cm) 0. Volume: (cm) 0. % Reduction in Area: 70.3% % Reduction in Volume: 84.8% Epithelialization: Medium (34-66%) 196 Tunneling: No 02 Undermining: No Wound Description Classification: Full Thickness Without Exposed Suppor Wound Margin: Distinct, outline attached Exudate Amount: Medium Exudate Type: Serosanguineous Exudate Color: red, brown t Structures Foul Odor After Cleansing: No Slough/Fibrino  Yes Wound Bed Granulation Amount: Small (1-33%) Exposed Structure Granulation Quality: Red Fascia Exposed: No Necrotic Amount: Large (67-100%) Fat Layer (Subcutaneous Tissue) Exposed: Yes Necrotic Quality: Eschar, Adherent Slough Tendon Exposed: No Muscle Exposed: No Joint Exposed: No Bone Exposed: No Periwound Skin Texture Texture Color No Abnormalities Noted: No No Abnormalities Noted: Yes Scarring: Yes Temperature / Pain Temperature: No Abnormality Moisture No Abnormalities Noted: No Dry / Scaly: Yes Treatment Notes Wound #1 (Foot) Wound Laterality: Dorsal, Left Cleanser Seat, Iasha C ([id number]) [id number] Page 7 of 7 Soap and Water Discharge Instruction: May shower and wash wound with dial antibacterial soap and water prior to dressing change. Wound Cleanser Discharge Instruction: Cleanse the wound with wound cleanser prior to applying a clean dressing using gauze sponges, not tissue or cotton balls. Peri-Wound Care Triamcinolone 15 (g) Discharge Instruction: Use triamcinolone 15 (g) as directed Sween Lotion (Moisturizing lotion) Discharge Instruction: Apply moisturizing lotion as directed Topical Gentamicin Discharge Instruction: As directed by physician Mupirocin Ointment Discharge Instruction: Apply Mupirocin (Bactroban) as instructed Primary Dressing Promogran Prisma Matrix, 4.34 (sq in) (silver collagen) Discharge Instruction: Moisten collagen with saline or hydrogel Secondary Dressing Zetuvit Plus Silicone Border Dressing 4x4 (in/in) Discharge Instruction: Apply silicone border over primary dressing as directed. Secured With Compression Wrap Compression Stockings Facilities manager) Signed: 08/10/2022 4:45:47 PM By: Samuella Bruin Entered By: Samuella Bruin on 08/10/2022 13:58:44 -------------------------------------------------------------------------------- Vitals Details Patient Name: Date of  Service: [name], [name] 08/10/2022 1:45 PM Medical Record Number: [id number] Patient Account Number: [id number] Date of Birth/Sex: Treating RN: 1945-08-03 (77 y.o. Fredderick Phenix Primary Care Ercel Normoyle: [place], [place] Other Clinician: Referring Breannah Kratt: Treating Sparkle Aube/Extender: Ramon Dredge in Treatment: 11 Vital Signs Time Taken: 13:55 Temperature (F): 97.5 Height (in): 63 Pulse (  bpm): 72 Weight (lbs): 210 Respiratory Rate (breaths/min): 18 Body Mass Index (BMI): 37.2 Blood Pressure (mmHg): 152/80 Reference Range: 80 - 120 mg / dl Electronic Signature(s) Signed: 08/10/2022 4:45:47 PM By: Samuella Bruin Entered By: Samuella Bruin on 08/10/2022 13:55:21

## 2022-08-26 ENCOUNTER — Encounter (HOSPITAL_BASED_OUTPATIENT_CLINIC_OR_DEPARTMENT_OTHER): Payer: Medicare Other | Admitting: General Surgery

## 2022-08-26 DIAGNOSIS — L97522 Non-pressure chronic ulcer of other part of left foot with fat layer exposed: Secondary | ICD-10-CM | POA: Diagnosis not present

## 2022-09-01 NOTE — Progress Notes (Signed)
Krista, [name] ([id number]) [id number] Page 1 of 8 Visit Report for 08/26/2022 Chief Complaint Document Details Patient Name: Date of Service: [name], [name] 08/26/2022 3:15 PM Medical Record Number: [id number] Patient Account Number: [id number] Date of Birth/Sex: Treating RN: 1945-01-31 (77 y.o. F) Primary Care Provider: [place], [place] [facility] Other Clinician: Referring Provider: Treating Provider/Extender: Ramon Dredge in Treatment: 14 Information Obtained from: Patient Chief Complaint Patient seen for complaints of Non-Healing Wound. Electronic Signature(s) Signed: 08/26/2022 3:53:43 PM By: Duanne Guess MD FACS Entered By: Duanne Guess on 08/26/2022 15:53:43 -------------------------------------------------------------------------------- Debridement Details Patient Name: Date of Service: [name], [name] 08/26/2022 3:15 PM Medical Record Number: [id number] Patient Account Number: [id number] Date of Birth/Sex: Treating RN: 1945/08/26 (77 y.o. [name] Primary Care Provider: [place], [place] [facility] Other Clinician: Referring Provider: Treating Provider/Extender: Ramon Dredge in Treatment: 14 Debridement Performed for Assessment: Wound #1 Left,Dorsal Foot Performed By: Physician Duanne Guess, MD Debridement Type: Debridement Level of Consciousness (Pre-procedure): Awake and Alert Pre-procedure Verification/Time Out Yes - 15:45 Taken: Percent of Wound Bed Debrided: 100% T Area Debrided (cm): otal 0.07 Tissue and other material debrided: Non-Viable, Eschar, Slough, Slough Level: Non-Viable Tissue Debridement Description: Selective/Open Wound Instrument: Curette Bleeding: Minimum Hemostasis Achieved: Pressure Response to Treatment: Procedure was tolerated well Level of Consciousness (Post- Awake and Alert procedure): Post Debridement Measurements of Total Wound Length: (cm)  0.3 Width: (cm) 0.3 Depth: (cm) 0.1 Volume: (cm) 0.007 Character of Wound/Ulcer Post Debridement: Improved Post Procedure Diagnosis Same as Pre-procedure Notes [name], [name] ([id number]) [id number] Page 2 of 8 Scribed for Dr Lady Gary by Brenton Grills RN Electronic Signature(s) Signed: 08/26/2022 4:25:43 PM By: Duanne Guess MD FACS Signed: 09/01/2022 2:02:59 PM By: Brenton Grills Entered By: Brenton Grills on 08/26/2022 15:47:08 -------------------------------------------------------------------------------- HPI Details Patient Name: Date of Service: [name], [name] 08/26/2022 3:15 PM Medical Record Number: [id number] Patient Account Number: [id number] Date of Birth/Sex: Treating RN: [birth year] (77 y.o. F) Primary Care Provider: [place], [place] [facility] Other Clinician: Referring Provider: Treating Provider/Extender: Ramon Dredge in Treatment: 14 History of Present Illness HPI Description: ADMISSION 05/20/2022 This is a 77 year old woman with a chronic wound on her left foot. She has a remote history of trauma status post surgery and grafting many years ago. She reports that her dog stepped on her foot in December and the wound opened at that time. She was initially treated by her primary care doctor with mupirocin and a 10-day course of Bactrim for suspected cellulitis, back in February of this year. She is not diabetic and does not smoke. She was seen by Dr. Lajoyce Corners on March 26, 2022 and diagnosed with stasis dermatitis and chronic venous insufficiency. He prescribed compression stockings. His note makes no mention of the wound. She has not been wearing the compression stockings because she finds them difficult to apply. She reports wearing compression leggings, but is not wearing them today and cannot tell me what degree of compression they provide. 05/29/2022: There is just 1 open area remaining on the dorsum of her foot. There is a  little slough and eschar accumulation. She brought in her compression tights and although the degree of compression is not indicated, based on my own stretching and testing of the tensile strength, the best pair seems to be around 30 mmHg. She has some that are a little less snug and I told her that I prefer her to use the tighter ones. Her  venous reflux studies are scheduled for next Friday at 3 PM. 06/10/2022: She did not make it to her venous reflux studies last week. The wound on the dorsum of her foot remains open with a little slough and eschar accumulation. She is not wearing her compression garment. 07/01/2022: Her venous reflux studies were done and were negative. The wound on the dorsum of her foot is smaller with slough and eschar buildup again. She is wearing her compression garments. 07/15/2022: The wound measures smaller again today but does persist. There is a little bit of slough accumulation. No concern for infection. 07/29/2022: The wound is a little bit smaller again today. There is slough accumulation, but no other debris. 08/10/2022: No change to the wound dimensions today. There is slough and eschar accumulation. 08/26/2022: The wound is down to a tiny pinhole underlying some eschar. Electronic Signature(s) Signed: 08/26/2022 3:54:19 PM By: Duanne Guess MD FACS Entered By: Duanne Guess on 08/26/2022 15:54:18 -------------------------------------------------------------------------------- Physical Exam Details Patient Name: Date of Service: [name], [name] 08/26/2022 3:15 PM Medical Record Number: [id number] Patient Account Number: [id number] Date of Birth/Sex: Treating RN: 1945-11-25 (77 y.o. F) Primary Care Provider: [place], [place] [facility] Other Clinician: Referring Provider: Treating Provider/Extender: Ramon Dredge in Treatment: 14 Constitutional Hypertensive, asymptomatic. . . . no acute distress. Krista, [name] ([id number])  [id number] Page 3 of 8 Respiratory Normal work of breathing on room air. Notes 08/26/2022: The wound is down to a tiny pinhole underlying some eschar. Electronic Signature(s) Signed: 08/26/2022 3:54:50 PM By: Duanne Guess MD FACS Entered By: Duanne Guess on 08/26/2022 15:54:50 -------------------------------------------------------------------------------- Physician Orders Details Patient Name: Date of Service: [name], [name] 08/26/2022 3:15 PM Medical Record Number: [id number] Patient Account Number: [id number] Date of Birth/Sex: Treating RN: Nov 28, 1945 (77 y.o. [name] Primary Care Provider: [place], [place] [facility] Other Clinician: Referring Provider: Treating Provider/Extender: Ramon Dredge in Treatment: [phone] Verbal / Phone Orders: No Diagnosis Coding ICD-10 Coding Code Description [phone] Non-pressure chronic ulcer of other part of left foot with fat layer exposed I87.2 Venous insufficiency (chronic) (peripheral) I89.0 Lymphedema, not elsewhere classified Follow-up Appointments ppointment in 2 weeks. - Dr Lady Gary - room 3 Return A Anesthetic (In clinic) Topical Lidocaine 5% applied to wound bed Bathing/ Shower/ Hygiene May shower and wash wound with soap and water. Edema Control - Lymphedema / SCD / Other Elevate legs to the level of the heart or above for 30 minutes daily and/or when sitting for 3-4 times a day throughout the day. Avoid standing for long periods of time. Patient to wear own compression stockings every day. - to right leg Moisturize legs daily. - Sween lotion Wound Treatment Wound #1 - Foot Wound Laterality: Dorsal, Left Cleanser: Soap and Water 1 x Per Day/30 Days Discharge Instructions: May shower and wash wound with dial antibacterial soap and water prior to dressing change. Cleanser: Wound Cleanser (Generic) 1 x Per Day/30 Days Discharge Instructions: Cleanse the wound with wound cleanser  prior to applying a clean dressing using gauze sponges, not tissue or cotton balls. Peri-Wound Care: Triamcinolone 15 (g) (Generic) 1 x Per Day/30 Days Discharge Instructions: Use triamcinolone 15 (g) as directed Peri-Wound Care: Sween Lotion (Moisturizing lotion) (Generic) 1 x Per Day/30 Days Discharge Instructions: Apply moisturizing lotion as directed Topical: Gentamicin 1 x Per Day/30 Days Discharge Instructions: As directed by physician Topical: Mupirocin Ointment 1 x Per Day/30 Days Discharge Instructions: Apply Mupirocin (Bactroban) as instructed Prim Dressing: Maxorb  Extra Ag+ Alginate Dressing, 4x4.75 (in/in) (Generic) 1 x Per Day/30 Days ary Discharge Instructions: Apply to wound bed as instructed Secondary Dressing: Zetuvit Plus Silicone Border Dressing 4x4 (in/in) (Generic) 1 x Per Day/30 Days ORALEE, KAN ([id number]) [id number] Page 4 of 8 Discharge Instructions: Apply silicone border over primary dressing as directed. Electronic Signature(s) Signed: 08/26/2022 4:25:43 PM By: Duanne Guess MD FACS Entered By: Duanne Guess on 08/26/2022 15:55:06 -------------------------------------------------------------------------------- Problem List Details Patient Name: Date of Service: [name], [name] 08/26/2022 3:15 PM Medical Record Number: [id number] Patient Account Number: [id number] Date of Birth/Sex: Treating RN: 26-Jun-1945 (77 y.o. [name] Primary Care Provider: [place] Blas, [place] [facility] Other Clinician: Referring Provider: Treating Provider/Extender: Ramon Dredge in Treatment: 14 Active Problems ICD-10 Encounter Code Description Active Date MDM Diagnosis L97.522 Non-pressure chronic ulcer of other part of left foot with fat layer exposed 05/20/2022 No Yes I87.2 Venous insufficiency (chronic) (peripheral) 05/20/2022 No Yes I89.0 Lymphedema, not elsewhere classified 05/20/2022 No Yes Inactive  Problems Resolved Problems Electronic Signature(s) Signed: 08/26/2022 3:51:01 PM By: Duanne Guess MD FACS Entered By: Duanne Guess on 08/26/2022 15:51:01 -------------------------------------------------------------------------------- Progress Note Details Patient Name: Date of Service: [name], [name] 08/26/2022 3:15 PM Medical Record Number: [id number] Patient Account Number: [id number] Date of Birth/Sex: Treating RN: 1945-01-10 (77 y.o. F) Primary Care Provider: [place], [place] [facility] Other Clinician: Referring Provider: Treating Provider/Extender: Ramon Dredge in Treatment: 14 Subjective Chief Complaint Information obtained from Patient Patient seen for complaints of Non-Healing Wound. SHYESHA, CLIPPARD ([id number]) [id number] Page 5 of 8 History of Present Illness (HPI) ADMISSION 05/20/2022 This is a 77 year old woman with a chronic wound on her left foot. She has a remote history of trauma status post surgery and grafting many years ago. She reports that her dog stepped on her foot in December and the wound opened at that time. She was initially treated by her primary care doctor with mupirocin and a 10-day course of Bactrim for suspected cellulitis, back in February of this year. She is not diabetic and does not smoke. She was seen by Dr. Lajoyce Corners on March 26, 2022 and diagnosed with stasis dermatitis and chronic venous insufficiency. He prescribed compression stockings. His note makes no mention of the wound. She has not been wearing the compression stockings because she finds them difficult to apply. She reports wearing compression leggings, but is not wearing them today and cannot tell me what degree of compression they provide. 05/29/2022: There is just 1 open area remaining on the dorsum of her foot. There is a little slough and eschar accumulation. She brought in her compression tights and although the degree of compression  is not indicated, based on my own stretching and testing of the tensile strength, the best pair seems to be around 30 mmHg. She has some that are a little less snug and I told her that I prefer her to use the tighter ones. Her venous reflux studies are scheduled for next Friday at 3 PM. 06/10/2022: She did not make it to her venous reflux studies last week. The wound on the dorsum of her foot remains open with a little slough and eschar accumulation. She is not wearing her compression garment. 07/01/2022: Her venous reflux studies were done and were negative. The wound on the dorsum of her foot is smaller with slough and eschar buildup again. She is wearing her compression garments. 07/15/2022: The wound measures smaller again today but does persist. There  is a little bit of slough accumulation. No concern for infection. 07/29/2022: The wound is a little bit smaller again today. There is slough accumulation, but no other debris. 08/10/2022: No change to the wound dimensions today. There is slough and eschar accumulation. 08/26/2022: The wound is down to a tiny pinhole underlying some eschar. Patient History Family History Cancer - Mother, Diabetes, Heart Disease, Hypertension, No family history of Hereditary Spherocytosis, Kidney Disease, Lung Disease, Seizures, Stroke, Thyroid Problems, Tuberculosis. Social History Never smoker, Marital Status - Divorced, Alcohol Use - Never, Drug Use - No History, Caffeine Use - Daily. Medical History Respiratory Patient has history of Asthma Cardiovascular Patient has history of Hypertension, Peripheral Venous Disease Musculoskeletal Patient has history of Osteoarthritis Hospitalization/Surgery History - total hip arthroplasty. - elbow arthroscopy with tendon reconstruction. - foot surgery. Objective Constitutional Hypertensive, asymptomatic. no acute distress. Vitals Time Taken: 3:32 PM, Height: 63 in, Weight: 210 lbs, BMI: 37.2, Temperature: 97.7 F, Pulse:  71 bpm, Respiratory Rate: 18 breaths/min, Blood Pressure: 152/87 mmHg. Respiratory Normal work of breathing on room air. General Notes: 08/26/2022: The wound is down to a tiny pinhole underlying some eschar. Integumentary (Hair, Skin) Wound #1 status is Open. Original cause of wound was Trauma. The date acquired was: 12/24/2021. The wound has been in treatment 14 weeks. The wound is located on the Left,Dorsal Foot. The wound measures 0.3cm length x 0.3cm width x 0.1cm depth; 0.071cm^2 area and 0.007cm^3 volume. There is Fat Layer (Subcutaneous Tissue) exposed. There is no tunneling or undermining noted. There is a medium amount of serosanguineous drainage noted. The wound margin is distinct with the outline attached to the wound base. There is small (1-33%) red granulation within the wound bed. There is a large (67-100%) amount of necrotic tissue within the wound bed including Eschar and Adherent Slough. The periwound skin appearance had no abnormalities noted for color. The periwound skin appearance exhibited: Scarring, Dry/Scaly. Periwound temperature was noted as No Abnormality. Assessment Active Problems CHRISTEENA, SCHREIBER ([id number]) [id number] Page 6 of 8 ICD-10 Non-pressure chronic ulcer of other part of left foot with fat layer exposed Venous insufficiency (chronic) (peripheral) Lymphedema, not elsewhere classified Procedures Wound #1 Pre-procedure diagnosis of Wound #1 is a Lymphedema located on the Left,Dorsal Foot . There was a Selective/Open Wound Non-Viable Tissue Debridement with a total area of 0.07 sq cm performed by Duanne Guess, MD. With the following instrument(s): Curette to remove Non-Viable tissue/material. Material removed includes Eschar and Slough and. No specimens were taken. A time out was conducted at 15:45, prior to the start of the procedure. A Minimum amount of bleeding was controlled with Pressure. The procedure was tolerated  well. Post Debridement Measurements: 0.3cm length x 0.3cm width x 0.1cm depth; 0.007cm^3 volume. Character of Wound/Ulcer Post Debridement is improved. Post procedure Diagnosis Wound #1: Same as Pre-Procedure General Notes: Scribed for Dr Lady Gary by Brenton Grills RN. Plan Follow-up Appointments: Return Appointment in 2 weeks. - Dr Lady Gary - room 3 Anesthetic: (In clinic) Topical Lidocaine 5% applied to wound bed Bathing/ Shower/ Hygiene: May shower and wash wound with soap and water. Edema Control - Lymphedema / SCD / Other: Elevate legs to the level of the heart or above for 30 minutes daily and/or when sitting for 3-4 times a day throughout the day. Avoid standing for long periods of time. Patient to wear own compression stockings every day. - to right leg Moisturize legs daily. - Sween lotion WOUND #1: - Foot Wound Laterality: Dorsal, Left Cleanser: Soap and  Water 1 x Per Day/30 Days Discharge Instructions: May shower and wash wound with dial antibacterial soap and water prior to dressing change. Cleanser: Wound Cleanser (Generic) 1 x Per Day/30 Days Discharge Instructions: Cleanse the wound with wound cleanser prior to applying a clean dressing using gauze sponges, not tissue or cotton balls. Peri-Wound Care: Triamcinolone 15 (g) (Generic) 1 x Per Day/30 Days Discharge Instructions: Use triamcinolone 15 (g) as directed Peri-Wound Care: Sween Lotion (Moisturizing lotion) (Generic) 1 x Per Day/30 Days Discharge Instructions: Apply moisturizing lotion as directed Topical: Gentamicin 1 x Per Day/30 Days Discharge Instructions: As directed by physician Topical: Mupirocin Ointment 1 x Per Day/30 Days Discharge Instructions: Apply Mupirocin (Bactroban) as instructed Prim Dressing: Maxorb Extra Ag+ Alginate Dressing, 4x4.75 (in/in) (Generic) 1 x Per Day/30 Days ary Discharge Instructions: Apply to wound bed as instructed Secondary Dressing: Zetuvit Plus Silicone Border Dressing 4x4 (in/in)  (Generic) 1 x Per Day/30 Days Discharge Instructions: Apply silicone border over primary dressing as directed. 08/26/2022: The wound is down to a tiny pinhole underlying some eschar. I used a curette to debride the eschar from the wound. We will continue topical gentamicin and mupirocin. I am going to change the contact layer to Prisma silver collagen to see if we can get some moisture absorption at the site. She will follow-up in 2 weeks. Electronic Signature(s) Signed: 08/26/2022 3:57:46 PM By: Duanne Guess MD FACS Entered By: Duanne Guess on 08/26/2022 15:57:46 -------------------------------------------------------------------------------- HxROS Details Patient Name: Date of Service: [name], [name] 08/26/2022 3:15 PM Medical Record Number: [id number] Patient Account Number: [id number] Date of Birth/Sex: Treating RN: 06/02/1945 (77 y.o. SHAYLEE, TRACE ([id number]) [id number] Page 7 of 8 Primary Care Provider: [place], [place] [facility] Other Clinician: Referring Provider: Treating Provider/Extender: Ramon Dredge in Treatment: 14 Respiratory Medical History: Positive for: Asthma Cardiovascular Medical History: Positive for: Hypertension; Peripheral Venous Disease Musculoskeletal Medical History: Positive for: Osteoarthritis Immunizations Pneumococcal Vaccine: Received Pneumococcal Vaccination: No Implantable Devices None Hospitalization / Surgery History Type of Hospitalization/Surgery total hip arthroplasty elbow arthroscopy with tendon reconstruction foot surgery Family and Social History Cancer: Yes - Mother; Diabetes: Yes; Heart Disease: Yes; Hereditary Spherocytosis: No; Hypertension: Yes; Kidney Disease: No; Lung Disease: No; Seizures: No; Stroke: No; Thyroid Problems: No; Tuberculosis: No; Never smoker; Marital Status - Divorced; Alcohol Use: Never; Drug Use: No History; Caffeine Use: Daily; Financial  Concerns: No; Food, Clothing or Shelter Needs: No; Support System Lacking: No; Transportation Concerns: No Psychologist, prison and probation services) Signed: 08/26/2022 4:25:43 PM By: Duanne Guess MD FACS Entered By: Duanne Guess on 08/26/2022 15:54:28 -------------------------------------------------------------------------------- SuperBill Details Patient Name: Date of Service: [name], [name] 08/26/2022 Medical Record Number: [id number] Patient Account Number: [id number] Date of Birth/Sex: Treating RN: 10/26/1945 (77 y.o. [name] Primary Care Provider: [place], [place] Other Clinician: Referring Provider: Treating Provider/Extender: Ramon Dredge in Treatment: 14 Diagnosis Coding ICD-10 Codes Code Description [phone] Non-pressure chronic ulcer of other part of left foot with fat layer exposed I87.2 Venous insufficiency (chronic) (peripheral) I89.0 Lymphedema, not elsewhere classified Facility Procedures : VALENTINE, ANTONY Code: [id number] [facility] ([id number] Description: 97597 - DEBRIDE WOUND 1ST 20 SQ CM OR < ICD-10 Diagnosis Description L97.522 Non-pressure chronic ulcer of other part of left foot with fat layer exposed 75) [phone] Modifier: [id number] Quantity: 1 [id number].pdf Page 8 of 8 Physician Procedures : CPT4 Code Description Modifier [id number] - WC PHYS LEVEL 4 - EST PT 25 ICD-10 Diagnosis Description L97.522 Non-pressure chronic  ulcer of other part of left foot with fat layer exposed I87.2 Venous insufficiency (chronic) (peripheral) I89.0  Lymphedema, not elsewhere classified Quantity: 1 : [id number] 97597 - WC PHYS DEBR WO ANESTH 20 SQ CM ICD-10 Diagnosis Description L97.522 Non-pressure chronic ulcer of other part of left foot with fat layer exposed Quantity: 1 Electronic Signature(s) Signed: 08/26/2022 3:59:19 PM By: Duanne Guess MD FACS Entered By: Duanne Guess on 08/26/2022 15:59:19

## 2022-09-01 NOTE — Progress Notes (Signed)
Krista, [name] ([id number]) [id number] Page 1 of 7 Visit Report for 08/26/2022 Arrival Information Details Patient Name: Date of Service: [place], [name] 08/26/2022 3:15 PM Medical Record Number: [id number] Patient Account Number: [id number] Date of Birth/Sex: Treating RN: [birth year] (77 y.o. [name] Primary Care [name]: [name] NG, [place] NCIS Other Clinician: Referring [name]: Treating Ceniya Fowers/Extender: [name] in Treatment: 14 Visit Information History Since Last Visit All ordered tests and consults were completed: Yes Patient Arrived: Cane Added or deleted any medications: No Arrival Time: 15:29 Any new allergies or adverse reactions: No Accompanied By: self Had a fall or experienced change in No Transfer Assistance: None activities of daily living that may affect Patient Identification Verified: Yes risk of falls: Secondary Verification Process Completed: Yes Signs or symptoms of abuse/neglect since last visito No Patient Requires Transmission-Based Precautions: No Hospitalized since last visit: No Patient Has Alerts: No Implantable device outside of the clinic excluding No cellular tissue based products placed in the center since last visit: Has Dressing in Place as Prescribed: Yes Pain Present Now: No Electronic Signature(s) Signed: 09/01/2022 2:02:59 PM By: [name] Entered By: [name] on 08/26/2022 15:32:39 -------------------------------------------------------------------------------- Encounter Discharge Information Details Patient Name: Date of Service: [name], [place] Krista C. 08/26/2022 3:15 PM Medical Record Number: [id number] Patient Account Number: [id number] Date of Birth/Sex: Treating RN: [birth year] (79 y.o. [name] Primary Care [name]: [place] [name], [place] NCIS Other Clinician: Referring [name]: Treating [name]/Extender: [name] in Treatment:  14 Encounter Discharge Information Items Post Procedure Vitals Discharge Condition: Stable Temperature (F): 98.6 Ambulatory Status: Cane Pulse (bpm): 65 Discharge Destination: Home Respiratory Rate (breaths/min): 18 Transportation: Private Auto Blood Pressure (mmHg): 126/74 Accompanied By: self Schedule Follow-up Appointment: Yes Clinical Summary of Care: Patient Declined Electronic Signature(s) Signed: 09/01/2022 2:02:59 PM By: [name] Entered By: [name] on 08/26/2022 15:54:15 [name] ([id number]) [id number] Page 2 of 7 -------------------------------------------------------------------------------- Lower Extremity Assessment Details Patient Name: Date of Service: [name], [name] 08/26/2022 3:15 PM Medical Record Number: [id number] Patient Account Number: [id number] Date of Birth/Sex: Treating RN: 1945/06/15 (77 y.o. [name] Primary Care Aanvi Voyles: [name], FRA NCIS Other Clinician: Referring Sukanya Goldblatt: Treating [name]/Extender: [name] in Treatment: 14 Edema Assessment Assessed: [Left: No] [Right: No] [Left: Edema] [Right: :] Calf Left: Right: Point of Measurement: From Medial Instep 45.1 cm Ankle Left: Right: Point of Measurement: From Medial Instep 24.3 cm Vascular Assessment Pulses: Dorsalis Pedis Palpable: [Left:Yes] Extremity colors, hair growth, and conditions: Hair Growth on Extremity: [Left:Yes] Temperature of Extremity: [Left:Warm] Capillary Refill: [Left:< 3 seconds] Dependent Rubor: [Left:No No] Toe Nail Assessment Left: Right: Thick: No Discolored: No Deformed: No Improper Length and Hygiene: No Electronic Signature(s) Signed: 09/01/2022 2:02:59 PM By: [name] Entered By: [name] on 08/26/2022 15:33:52 -------------------------------------------------------------------------------- Multi Wound Chart Details Patient Name: Date of Service: HO  DGES, [place] Krista C. 08/26/2022 3:15 PM Medical Record Number: [id number] Patient Account Number: [id number] Date of Birth/Sex: Treating RN: 1945/12/22 (77 y.o. F) Primary Care Brionna Romanek: [place] [name], [place] NCIS Other Clinician: Referring [name]: Treating [name]/Extender: [name] in Treatment: 14 Vital Signs Height(in): 63 Pulse(bpm): 71 Weight(lbs): 210 Blood Pressure(mmHg): 152/87 Body Mass Index(BMI): 37.2 Temperature(F): 97.7 [name], [name] ([id number]) [phone].pdf Page 3 of 7 Respiratory Rate(breaths/min): 18 [1:Photos:] [N/A:N/A] Left, Dorsal Foot N/A N/A Wound Location: Trauma N/A N/A Wounding Event: Lymphedema N/A N/A Primary Etiology: Asthma, Hypertension, Peripheral N/A N/A Comorbid History:  Venous Disease, Osteoarthritis 12/24/2021 N/A N/A Date Acquired: 14 N/A N/A Weeks of Treatment: Open N/A N/A Wound Status: No N/A N/A Wound Recurrence: 0.3x0.3x0.1 N/A N/A Measurements L x W x D (cm) 0.071 N/A N/A A (cm) : rea 0.007 N/A N/A Volume (cm) : 89.20% N/A N/A % Reduction in A rea: 94.70% N/A N/A % Reduction in Volume: Full Thickness Without Exposed N/A N/A Classification: Support Structures Medium N/A N/A Exudate A mount: Serosanguineous N/A N/A Exudate Type: red, brown N/A N/A Exudate Color: Distinct, outline attached N/A N/A Wound Margin: Small (1-33%) N/A N/A Granulation A mount: Red N/A N/A Granulation Quality: Large (67-100%) N/A N/A Necrotic A mount: Eschar, Adherent Slough N/A N/A Necrotic Tissue: Fat Layer (Subcutaneous Tissue): Yes N/A N/A Exposed Structures: Fascia: No Tendon: No Muscle: No Joint: No Bone: No Medium (34-66%) N/A N/A Epithelialization: Debridement - Selective/Open Wound N/A N/A Debridement: Pre-procedure Verification/Time Out 15:45 N/A N/A Taken: Necrotic/Eschar, Slough N/A N/A Tissue Debrided: Non-Viable Tissue N/A N/A Level: 0.07 N/A N/A Debridement A  (sq cm): rea Curette N/A N/A Instrument: Minimum N/A N/A Bleeding: Pressure N/A N/A Hemostasis A chieved: Procedure was tolerated well N/A N/A Debridement Treatment Response: 0.3x0.3x0.1 N/A N/A Post Debridement Measurements L x W x D (cm) 0.007 N/A N/A Post Debridement Volume: (cm) Scarring: Yes N/A N/A Periwound Skin Texture: Dry/Scaly: Yes N/A N/A Periwound Skin Moisture: No Abnormalities Noted N/A N/A Periwound Skin Color: No Abnormality N/A N/A Temperature: Debridement N/A N/A Procedures Performed: Treatment Notes Electronic Signature(s) Signed: 08/26/2022 3:53:37 PM By: Duanne Guess MD FACS Entered By: Duanne Guess on 08/26/2022 15:53:37 Multi-Disciplinary Care Plan Details -------------------------------------------------------------------------------- Lattie Corns ([id number]) [id number] Page 4 of 7 Patient Name: Date of Service: HO [place], [name] 08/26/2022 3:15 PM Medical Record Number: [id number] Patient Account Number: [id number] Date of Birth/Sex: Treating RN: 05/27/1945 (77 y.o. [name] Primary Care Arvell Pulsifer: [place] [name], [place] NCIS Other Clinician: Referring Kiara Mcdowell: Treating Osiris Charles/Extender: [name] in Treatment: 14 Active Inactive Wound/Skin Impairment Nursing Diagnoses: Impaired tissue integrity Knowledge deficit related to ulceration/compromised skin integrity Goals: Patient/caregiver will verbalize understanding of skin care regimen Date Initiated: 05/20/2022 Target Resolution Date: 09/04/2022 Goal Status: Active Interventions: Assess ulceration(s) every visit Treatment Activities: Skin care regimen initiated : 05/20/2022 Topical wound management initiated : 05/20/2022 Notes: Electronic Signature(s) Signed: 09/01/2022 2:02:59 PM By: [name] Entered By: [name] on 08/26/2022  15:37:05 -------------------------------------------------------------------------------- Pain Assessment Details Patient Name: Date of Service: [name], [place] Krista C. 08/26/2022 3:15 PM Medical Record Number: [id number] Patient Account Number: [id number] Date of Birth/Sex: Treating RN: 1945/05/21 (77 y.o. [name] Primary Care Danyele Smejkal: [place] [name], [place] NCIS Other Clinician: Referring Kamauri Denardo: Treating Muhannad Bignell/Extender: [name] in Treatment: 14 Active Problems Location of Pain Severity and Description of Pain Patient Has Paino No Site Locations Pain Management and Medication Lindy, Pepper C ([id number]) [id number] Page 5 of 7 Current Pain Management: Electronic Signature(s) Signed: 09/01/2022 2:02:59 PM By: [name] Entered By: [name] on 08/26/2022 15:33:38 -------------------------------------------------------------------------------- Patient/Caregiver Education Details Patient Name: Date of Service: [name], [place] Krista Salena Saner 8/21/2024andnbsp3:15 PM Medical Record Number: [id number] Patient Account Number: [id number] Date of Birth/Gender: Treating RN: [birth year] (77 y.o. [name] Primary Care Physician:  [name], [place] NCIS Other Clinician: Referring Physician: Treating Physician/Extender: [name] in Treatment: 14 Education Assessment Education Provided To: Patient Education Topics Provided Wound/Skin Impairment: Methods: Explain/Verbal Responses: State content correctly Electronic Signature(s) Signed: 09/01/2022 2:02:59 PM By: Isabelle Course  By: [name] on 08/26/2022 15:38:21 -------------------------------------------------------------------------------- Wound Assessment Details Patient Name: Date of Service: HO [place], [name] 08/26/2022 3:15 PM Medical Record Number: [id number] Patient Account Number: [id number] Date of Birth/Sex: Treating  RN: 1945-12-13 (77 y.o. [name] Primary Care Shaylen Nephew: [place] [name], FRA NCIS Other Clinician: Referring Ramisa Duman: Treating Marciel Offenberger/Extender: [name] in Treatment: 14 Wound Status Wound Number: 1 Primary Lymphedema Etiology: Wound Location: Left, Dorsal Foot Wound Status: Open Wounding Event: Trauma Comorbid Asthma, Hypertension, Peripheral Venous Disease, Date Acquired: 12/24/2021 History: Osteoarthritis Weeks Of Treatment: 14 Clustered Wound: No Photos EMMARI, PRUSKI ([id number]) [id number] Page 6 of 7 Wound Measurements Length: (cm) 0.3 Width: (cm) 0.3 Depth: (cm) 0.1 Area: (cm) 0.071 Volume: (cm) 0.007 % Reduction in Area: 89.2% % Reduction in Volume: 94.7% Epithelialization: Medium (34-66%) Tunneling: No Undermining: No Wound Description Classification: Full Thickness Without Exposed Support Structures Wound Margin: Distinct, outline attached Exudate Amount: Medium Exudate Type: Serosanguineous Exudate Color: red, brown Foul Odor After Cleansing: No Slough/Fibrino Yes Wound Bed Granulation Amount: Small (1-33%) Exposed Structure Granulation Quality: Red Fascia Exposed: No Necrotic Amount: Large (67-100%) Fat Layer (Subcutaneous Tissue) Exposed: Yes Necrotic Quality: Eschar, Adherent Slough Tendon Exposed: No Muscle Exposed: No Joint Exposed: No Bone Exposed: No Periwound Skin Texture Texture Color No Abnormalities Noted: No No Abnormalities Noted: Yes Scarring: Yes Temperature / Pain Temperature: No Abnormality Moisture No Abnormalities Noted: No Dry / Scaly: Yes Treatment Notes Wound #1 (Foot) Wound Laterality: Dorsal, Left Cleanser Soap and Water Discharge Instruction: May shower and wash wound with dial antibacterial soap and water prior to dressing change. Wound Cleanser Discharge Instruction: Cleanse the wound with wound cleanser prior to applying a clean dressing using gauze  sponges, not tissue or cotton balls. Peri-Wound Care Triamcinolone 15 (g) Discharge Instruction: Use triamcinolone 15 (g) as directed Sween Lotion (Moisturizing lotion) Discharge Instruction: Apply moisturizing lotion as directed Topical Gentamicin Discharge Instruction: As directed by physician Mupirocin Ointment Discharge Instruction: Apply Mupirocin (Bactroban) as instructed Primary Dressing Maxorb Extra Ag+ Alginate Dressing, 4x4.75 (in/in) Discharge Instruction: Apply to wound bed as instructed Secondary Dressing Zetuvit Plus Silicone Border Dressing 4x4 (in/in) JADORA, CARRAHER ([id number]) [id number] Page 7 of 7 Discharge Instruction: Apply silicone border over primary dressing as directed. Secured With Compression Wrap Compression Stockings Facilities manager) Signed: 09/01/2022 2:02:59 PM By: [name] Entered By: [name] on 08/26/2022 15:36:51 -------------------------------------------------------------------------------- Vitals Details Patient Name: Date of Service: [name], [place] Krista C. 08/26/2022 3:15 PM Medical Record Number: [id number] Patient Account Number: [id number] Date of Birth/Sex: Treating RN: 28-Sep-1945 (77 y.o. [name] Primary Care Elva Breaker: [name] NG, F[place] Other Clinician: Referring Penney Domanski: Treating Ashlye Oviedo/Extender: [name] in Treatment: 14 Vital Signs Time Taken: 15:32 Temperature (F): 97.7 Height (in): 63 Pulse (bpm): 71 Weight (lbs): 210 Respiratory Rate (breaths/min): 18 Body Mass Index (BMI): 37.2 Blood Pressure (mmHg): 152/87 Reference Range: 80 - 120 mg / dl Electronic Signature(s) Signed: 09/01/2022 2:02:59 PM By: [name] Entered By: [name] on 08/26/2022 15:33:18

## 2022-09-09 ENCOUNTER — Encounter (HOSPITAL_BASED_OUTPATIENT_CLINIC_OR_DEPARTMENT_OTHER): Payer: Medicare Other | Attending: General Surgery | Admitting: General Surgery

## 2022-09-09 DIAGNOSIS — I872 Venous insufficiency (chronic) (peripheral): Secondary | ICD-10-CM | POA: Insufficient documentation

## 2022-09-09 DIAGNOSIS — L97522 Non-pressure chronic ulcer of other part of left foot with fat layer exposed: Secondary | ICD-10-CM | POA: Insufficient documentation

## 2022-09-09 DIAGNOSIS — J45909 Unspecified asthma, uncomplicated: Secondary | ICD-10-CM | POA: Insufficient documentation

## 2022-09-09 DIAGNOSIS — I890 Lymphedema, not elsewhere classified: Secondary | ICD-10-CM | POA: Insufficient documentation

## 2022-09-09 DIAGNOSIS — M1990 Unspecified osteoarthritis, unspecified site: Secondary | ICD-10-CM | POA: Diagnosis not present

## 2022-09-09 DIAGNOSIS — I10 Essential (primary) hypertension: Secondary | ICD-10-CM | POA: Diagnosis not present

## 2022-09-15 NOTE — Progress Notes (Signed)
NOYA, GRIFFUS ([id number]) [id number] Page 1 of 7 Visit Report for 09/09/2022 Arrival Information Details Patient Name: Date of Service: [place], [name]. 09/09/2022 4:00 PM Medical Record Number: [id number] Patient Account Number: [id number] Date of Birth/Sex: Treating RN: Jan 17, 1945 (77 y.o. [name] Primary Care [name]: [name] NG, [place] NCIS Other [name]: Referring [name]: Treating [name]/Extender: [name] in Treatment: 16 Visit Information History Since Last Visit All ordered tests and consults were completed: Yes Patient Arrived: Ambulatory Added or deleted any medications: No Arrival Time: 16:03 Any new allergies or adverse reactions: No Accompanied By: self Had a fall or experienced change in No Transfer Assistance: None activities of daily living that may affect Patient Identification Verified: Yes risk of falls: Secondary Verification Process Completed: Yes Signs or symptoms of abuse/neglect since last visito No Patient Requires Transmission-Based Precautions: No Hospitalized since last visit: No Patient Has Alerts: No Implantable device outside of the clinic excluding No cellular tissue based products placed in the center since last visit: Has Dressing in Place as Prescribed: Yes Pain Present Now: No Electronic Signature(s) Signed: 09/15/2022 3:48:27 PM By: [name] Entered By: [name] on 09/09/2022 13:04:11 -------------------------------------------------------------------------------- Encounter Discharge Information Details Patient Name: Date of Service: [name] [name]. 09/09/2022 4:00 PM Medical Record Number: [id number] Patient Account Number: [id number] Date of Birth/Sex: Treating RN: 10-27-1945 (77 y.o. [name] Primary Care Trenace Coughlin: [name], [place] NCIS Other [name]: Referring Taron Conrey: Treating Ewing Fandino/Extender: [name] in  Treatment: 16 Encounter Discharge Information Items Post Procedure Vitals Discharge Condition: Stable Temperature (F): 98.3 Ambulatory Status: Cane Pulse (bpm): 74 Discharge Destination: Home Respiratory Rate (breaths/min): 18 Transportation: Private Auto Blood Pressure (mmHg): 122/65 Accompanied By: self Schedule Follow-up Appointment: Yes Clinical Summary of Care: Patient Declined Electronic Signature(s) Signed: 09/15/2022 3:48:27 PM By: [name] Entered By: [name] on 09/09/2022 13:23:22 [name], [name] ([id number]) [id number] Page 2 of 7 -------------------------------------------------------------------------------- Lower Extremity Assessment Details Patient Name: Date of Service: [place], [name]. 09/09/2022 4:00 PM Medical Record Number: [id number] Patient Account Number: [id number] Date of Birth/Sex: Treating RN: August 31, 1945 (77 y.o. [name] Primary Care [name]: [name], [name]: Referring Makenah Karas: Treating Caileigh Canche/Extender: [name] in Treatment: 16 Edema Assessment Assessed: [Left: No] [Right: No] [Left: Edema] [Right: :] Calf Left: Right: Point of Measurement: From Medial Instep 45.1 cm Ankle Left: Right: Point of Measurement: From Medial Instep 24.3 cm Vascular Assessment Pulses: Dorsalis Pedis Palpable: [Left:Yes] Extremity colors, hair growth, and conditions: Hair Growth on Extremity: [Left:Yes] Temperature of Extremity: [Left:Warm] Capillary Refill: [Left:< 3 seconds] Dependent Rubor: [Left:No No] Toe Nail Assessment Left: Right: Thick: Yes Discolored: Yes Deformed: Yes Improper Length and Hygiene: Yes Electronic Signature(s) Signed: 09/15/2022 3:48:27 PM By: [name] Entered By: [name] on 09/09/2022 13:06:15 -------------------------------------------------------------------------------- Multi Wound Chart Details Patient Name: Date of  Service: [name] [name]. 09/09/2022 4:00 PM Medical Record Number: [id number] Patient Account Number: [id number] Date of Birth/Sex: Treating RN: 06/10/1945 (77 y.o. F) Primary Care [name]: Beattie [name], [place] NCIS Other [name]: Referring Karam Dunson: Treating Dennison Mcdaid/Extender: [name] in Treatment: 16 Vital Signs Height(in): 63 Pulse(bpm): 76 Weight(lbs): 210 Blood Pressure(mmHg): 158/83 Body Mass Index(BMI): 37.2 Temperature(F): 97.6 [name], [name] ([id number]) [id number] Page 3 of 7 Respiratory Rate(breaths/min): 18 [1:Photos: No Photos Left, Dorsal Foot Wound Location: Trauma Wounding Event: Lymphedema Primary Etiology: Asthma, Hypertension, Peripheral Comorbid History: Venous Disease, Osteoarthritis 12/24/2021 Date Acquired: 83  Weeks of Treatment: Open Wound Status: No Wound  Recurrence: 0.2x0.2x0.1 Measurements L x W x D (cm) 0.031 A (cm) : rea 0.003 Volume (cm) : 95.30% % Reduction in A rea: 97.70% % Reduction in Volume: Full Thickness Without Exposed Classification: Support Structures Medium Exudate A mount:  Serosanguineous Exudate Type: red, brown Exudate Color: Distinct, outline attached Wound Margin: Small (1-33%) Granulation A mount: Red Granulation Quality: Large (67-100%) Necrotic A mount: Eschar, Adherent Slough Necrotic Tissue: Fat Layer  (Subcutaneous Tissue): Yes N/A Exposed Structures: Fascia: No Tendon: No Muscle: No Joint: No Bone: No Medium (34-66%) Epithelialization: Debridement - Selective/Open Wound N/A Debridement: Pre-procedure Verification/Time Out 16:13 Taken:  Necrotic/Eschar, Slough Tissue Debrided: Non-Viable Tissue Level: 0.03 Debridement A (sq cm): rea Curette Instrument: Minimum Bleeding: Pressure Hemostasis A chieved: Procedure was tolerated well Debridement Treatment Response: 0.2x0.2x0.1 Post  Debridement Measurements L x W x D (cm) 0.003 Post Debridement Volume: (cm) Scarring: Yes Periwound Skin  Texture: Dry/Scaly: Yes Periwound Skin Moisture: No Abnormalities Noted Periwound Skin Color: No Abnormality Temperature: Debridement Procedures  Performed:] [N/A:N/A N/A N/A N/A N/A N/A N/A N/A N/A N/A N/A N/A N/A N/A N/A N/A N/A N/A N/A N/A N/A N/A N/A N/A N/A N/A N/A N/A N/A N/A N/A N/A N/A N/A N/A N/A N/A N/A N/A] Treatment Notes Wound #1 (Foot) Wound Laterality: Dorsal, Left Cleanser Soap and Water Discharge Instruction: May shower and wash wound with dial antibacterial soap and water prior to dressing change. Wound Cleanser Discharge Instruction: Cleanse the wound with wound cleanser prior to applying a clean dressing using gauze sponges, not tissue or cotton balls. Peri-Wound Care Triamcinolone 15 (g) Discharge Instruction: Use triamcinolone 15 (g) as directed Sween Lotion (Moisturizing lotion) Discharge Instruction: Apply moisturizing lotion as directed Topical Gentamicin Discharge Instruction: As directed by physician Mupirocin Ointment Discharge Instruction: Apply Mupirocin (Bactroban) as instructed Primary Dressing Maxorb Extra Ag+ Alginate Dressing, 4x4.75 (in/in) Orne, [name] ([id number]) [id number] Page 4 of 7 Discharge Instruction: Apply to wound bed as instructed Secondary Dressing Zetuvit Plus Silicone Border Dressing 4x4 (in/in) Discharge Instruction: Apply silicone border over primary dressing as directed. Secured With Compression Wrap Compression Stockings Facilities manager) Signed: 09/09/2022 4:53:37 PM By: Duanne Guess MD FACS Entered By: Duanne Guess on 09/09/2022 13:53:37 -------------------------------------------------------------------------------- Multi-Disciplinary Care Plan Details Patient Name: Date of Service: [name] [name]. 09/09/2022 4:00 PM Medical Record Number: [id number] Patient Account Number: [id number] Date of Birth/Sex: Treating RN: 07-12-1945 (78 y.o. [name] Primary Care  Tyra Michelle: [place] [name], [place] NCIS Other [name]: Referring Caliber Landess: Treating [name]/Extender: [name] in Treatment: 16 Active Inactive Wound/Skin Impairment Nursing Diagnoses: Impaired tissue integrity Knowledge deficit related to ulceration/compromised skin integrity Goals: Patient/caregiver will verbalize understanding of skin care regimen Date Initiated: 05/20/2022 Target Resolution Date: 09/04/2022 Goal Status: Active Interventions: Assess ulceration(s) every visit Treatment Activities: Skin care regimen initiated : 05/20/2022 Topical wound management initiated : 05/20/2022 Notes: Electronic Signature(s) Signed: 09/15/2022 3:48:27 PM By: [name] Entered By: [name] on 09/09/2022 13:10:34 -------------------------------------------------------------------------------- Pain Assessment Details Patient Name: Date of Service: [name] [name]. 09/09/2022 4:00 PM Medical Record Number: [id number] Patient Account Number: [id number] Krista, [name] ([id number]) [id number] Page 5 of 7 Date of Birth/Sex: Treating RN: 1945/01/29 (77 y.o. [name] Primary Care Meleana Commerford:  [name], [place] NCIS Other [name]: Referring Jaran Sainz: Treating Azharia Surratt/Extender: [name] in Treatment: 16 Active Problems Location of Pain Severity and Description of Pain Patient Has Paino No Site Locations Pain Management  and Medication Current Pain Management: Electronic Signature(s) Signed: 09/15/2022 3:48:27 PM By: [name] Entered By: [name] on 09/09/2022 13:06:00 -------------------------------------------------------------------------------- Patient/Caregiver Education Details Patient Name: Date of Service: [name] [name]. 9/4/2024andnbsp4:00 PM Medical Record Number: [id number] Patient Account Number: [id number] Date of Birth/Gender: Treating RN: February 22, 1945 (77 y.o. [name] Primary Care Physician: [place] [name], [place] NCIS Other [name]: Referring Physician: Treating Physician/Extender: [name] in Treatment: 16 Education Assessment Education Provided To: Patient Education Topics Provided Wound/Skin Impairment: Methods: Explain/Verbal Responses: State content correctly Electronic Signature(s) Signed: 09/15/2022 3:48:27 PM By: [name] Entered By: [name] on 09/09/2022 13:10:49 Luper, [name] ([id number]) [id number] Page 6 of 7 -------------------------------------------------------------------------------- Wound Assessment Details Patient Name: Date of Service: Krista [place], [name]. 09/09/2022 4:00 PM Medical Record Number: [id number] Patient Account Number: [id number] Date of Birth/Sex: Treating RN: December 09, 1945 (77 y.o. [name] Primary Care Tarique Loveall: [name] NG, [name]: Referring Jnaya Butrick: Treating [name]/Extender: [name] in Treatment: 16 Wound Status Wound Number: 1 Primary Lymphedema Etiology: Wound Location: Left, Dorsal Foot Wound Status: Open Wounding Event: Trauma Comorbid Asthma, Hypertension, Peripheral Venous Disease, Date Acquired: 12/24/2021 History: Osteoarthritis Weeks Of Treatment: 16 Clustered Wound: No Wound Measurements Length: (cm) 0.2 Width: (cm) 0.2 Depth: (cm) 0.1 Area: (cm) 0.031 Volume: (cm) 0.003 % Reduction in Area: 95.3% % Reduction in Volume: 97.7% Epithelialization: Medium (34-66%) Tunneling: No Undermining: No Wound Description Classification: Full Thickness Without Exposed Support Structures Wound Margin: Distinct, outline attached Exudate Amount: Medium Exudate Type: Serosanguineous Exudate Color: red, brown Foul Odor After Cleansing: No Slough/Fibrino Yes Wound Bed Granulation Amount: Small (1-33%) Exposed Structure Granulation Quality: Red Fascia Exposed: No Necrotic Amount:  Large (67-100%) Fat Layer (Subcutaneous Tissue) Exposed: Yes Necrotic Quality: Eschar, Adherent Slough Tendon Exposed: No Muscle Exposed: No Joint Exposed: No Bone Exposed: No Periwound Skin Texture Texture Color No Abnormalities Noted: No No Abnormalities Noted: Yes Scarring: Yes Temperature / Pain Temperature: No Abnormality Moisture No Abnormalities Noted: No Dry / Scaly: Yes Treatment Notes Wound #1 (Foot) Wound Laterality: Dorsal, Left Cleanser Soap and Water Discharge Instruction: May shower and wash wound with dial antibacterial soap and water prior to dressing change. Wound Cleanser Discharge Instruction: Cleanse the wound with wound cleanser prior to applying a clean dressing using gauze sponges, not tissue or cotton balls. Peri-Wound Care Triamcinolone 15 (g) Discharge Instruction: Use triamcinolone 15 (g) as directed Sween Lotion (Moisturizing lotion) Discharge Instruction: Apply moisturizing lotion as directed Topical Gentamicin Discharge Instruction: As directed by physician Mupirocin Ointment Gancarz, [name] ([id number]) [id number] Page 7 of 7 Discharge Instruction: Apply Mupirocin (Bactroban) as instructed Primary Dressing Maxorb Extra Ag+ Alginate Dressing, 4x4.75 (in/in) Discharge Instruction: Apply to wound bed as instructed Secondary Dressing Zetuvit Plus Silicone Border Dressing 4x4 (in/in) Discharge Instruction: Apply silicone border over primary dressing as directed. Secured With Compression Wrap Compression Stockings Facilities manager) Signed: 09/15/2022 3:48:27 PM By: [name] Entered By: [name] on 09/09/2022 13:10:09 -------------------------------------------------------------------------------- Vitals Details Patient Name: Date of Service: [name] [name]. 09/09/2022 4:00 PM Medical Record Number: [id number] Patient Account Number: [id number] Date of Birth/Sex: Treating  RN: 08/20/1945 (77 y.o. [name] Primary Care Floriene Jeschke: [name] NG, [name]: Referring Lamel Mccarley: Treating [name]/Extender: [name] in Treatment: 16 Vital Signs Time Taken: 16:04 Temperature (F): 97.6 Height (in): 63 Pulse (bpm): 76 Weight (lbs): 210 Respiratory Rate (breaths/min): 18 Body Mass Index (BMI): 37.2 Blood Pressure (mmHg):  158/83 Reference Range: 80 - 120 mg / dl Electronic Signature(s) Signed: 09/15/2022 3:48:27 PM By: [name] Entered By: [name] on 09/09/2022 13:05:53

## 2022-09-15 NOTE — Progress Notes (Signed)
AALIAYAH, CLUFF ([id number]) [id number] Page 1 of 8 Visit Report for 09/09/2022 Chief Complaint Document Details Patient Name: Date of Service: [place], [name]. 09/09/2022 4:00 PM Medical Record Number: [id number] Patient Account Number: [id number] Date of Birth/Sex: Treating RN: 1945/10/20 (77 y.o. F) Primary Care Provider: [place] Blas, [place] NCIS Other Clinician: Referring Provider: Treating Provider/Extender: Ramon Dredge in Treatment: 16 Information Obtained from: Patient Chief Complaint Patient seen for complaints of Non-Healing Wound. Electronic Signature(s) Signed: 09/09/2022 4:57:24 PM By: Duanne Guess MD FACS Entered By: Duanne Guess on 09/09/2022 16:57:24 -------------------------------------------------------------------------------- Debridement Details Patient Name: Date of Service: [name], [name] 09/09/2022 4:00 PM Medical Record Number: [id number] Patient Account Number: [id number] Date of Birth/Sex: Treating RN: 1945-09-16 (77 y.o. [name] Primary Care Provider: [place] Blas, [place] NCIS Other Clinician: Referring Provider: Treating Provider/Extender: Ramon Dredge in Treatment: 16 Debridement Performed for Assessment: Wound #1 Left,Dorsal Foot Performed By: Physician Duanne Guess, MD Debridement Type: Debridement Level of Consciousness (Pre-procedure): Awake and Alert Pre-procedure Verification/Time Out Yes - 16:13 Taken: Start Time: 16:14 Percent of Wound Bed Debrided: 100% T Area Debrided (cm): otal 0.03 Tissue and other material debrided: Non-Viable, Eschar, Slough, Slough Level: Non-Viable Tissue Debridement Description: Selective/Open Wound Instrument: Curette Bleeding: Minimum Hemostasis Achieved: Pressure Response to Treatment: Procedure was tolerated well Level of Consciousness (Post- Awake and Alert procedure): Post Debridement Measurements of Total Wound Length:  (cm) 0.2 Width: (cm) 0.2 Depth: (cm) 0.1 Volume: (cm) 0.003 Character of Wound/Ulcer Post Debridement: Stable Post Procedure Diagnosis Same as Pre-procedure PERSEPHONIE, WORMAN C ([id number]) [id number] Page 2 of 8 Notes : Scribed for Dr. Lady Gary by Brenton Grills, RN Electronic Signature(s) Signed: 09/09/2022 5:03:18 PM By: Duanne Guess MD FACS Signed: 09/15/2022 3:48:27 PM By: Brenton Grills Entered By: Brenton Grills on 09/09/2022 16:18:18 -------------------------------------------------------------------------------- HPI Details Patient Name: Date of Service: [name], [name] 09/09/2022 4:00 PM Medical Record Number: [id number] Patient Account Number: [id number] Date of Birth/Sex: Treating RN: 1945/04/16 (77 y.o. F) Primary Care Provider: [place] Blas, [place] NCIS Other Clinician: Referring Provider: Treating Provider/Extender: Ramon Dredge in Treatment: 16 History of Present Illness HPI Description: ADMISSION 05/20/2022 This is a 77 year old woman with a chronic wound on her left foot. She has a remote history of trauma status post surgery and grafting many years ago. She reports that her dog stepped on her foot in December and the wound opened at that time. She was initially treated by her primary care doctor with mupirocin and a 10-day course of Bactrim for suspected cellulitis, back in February of this year. She is not diabetic and does not smoke. She was seen by Dr. Lajoyce Corners on March 26, 2022 and diagnosed with stasis dermatitis and chronic venous insufficiency. He prescribed compression stockings. His note makes no mention of the wound. She has not been wearing the compression stockings because she finds them difficult to apply. She reports wearing compression leggings, but is not wearing them today and cannot tell me what degree of compression they provide. 05/29/2022: There is just 1 open area remaining on the dorsum of her foot. There  is a little slough and eschar accumulation. She brought in her compression tights and although the degree of compression is not indicated, based on my own stretching and testing of the tensile strength, the best pair seems to be around 30 mmHg. She has some that are a little less snug and I told her that I prefer her to use  the tighter ones. Her venous reflux studies are scheduled for next Friday at 3 PM. 06/10/2022: She did not make it to her venous reflux studies last week. The wound on the dorsum of her foot remains open with a little slough and eschar accumulation. She is not wearing her compression garment. 07/01/2022: Her venous reflux studies were done and were negative. The wound on the dorsum of her foot is smaller with slough and eschar buildup again. She is wearing her compression garments. 07/15/2022: The wound measures smaller again today but does persist. There is a little bit of slough accumulation. No concern for infection. 07/29/2022: The wound is a little bit smaller again today. There is slough accumulation, but no other debris. 08/10/2022: No change to the wound dimensions today. There is slough and eschar accumulation. 08/26/2022: The wound is down to a tiny pinhole underlying some eschar. 09/09/2022: Once again, a tiny pinhole opening remains with some eschar accumulation. Electronic Signature(s) Signed: 09/09/2022 4:57:55 PM By: Duanne Guess MD FACS Entered By: Duanne Guess on 09/09/2022 16:57:55 -------------------------------------------------------------------------------- Physical Exam Details Patient Name: Date of Service: [name], [name] 09/09/2022 4:00 PM Medical Record Number: [id number] Patient Account Number: [id number] Date of Birth/Sex: Treating RN: May 31, 1945 (77 y.o. F) Primary Care Provider: [place] Blas, [place] NCIS Other Clinician: Referring Provider: Treating Provider/Extender: Ramon Dredge in Treatment: [address] [place] C ([id number])  [id number] Page 3 of 8 Constitutional Hypertensive, asymptomatic. . . . no acute distress. Respiratory Normal work of breathing on room air. Notes 09/09/2022: Once again, a tiny pinhole opening remains with some eschar accumulation. Electronic Signature(s) Signed: 09/09/2022 4:58:51 PM By: Duanne Guess MD FACS Entered By: Duanne Guess on 09/09/2022 16:58:51 -------------------------------------------------------------------------------- Physician Orders Details Patient Name: Date of Service: [name], [name] 09/09/2022 4:00 PM Medical Record Number: [id number] Patient Account Number: [id number] Date of Birth/Sex: Treating RN: 10-28-1945 (77 y.o. [name] Primary Care Provider: [place] Blas, [place] NCIS Other Clinician: Referring Provider: Treating Provider/Extender: Ramon Dredge in Treatment: [id number] Verbal / Phone Orders: No Diagnosis Coding ICD-10 Coding Code Description [phone] Non-pressure chronic ulcer of other part of left foot with fat layer exposed I87.2 Venous insufficiency (chronic) (peripheral) I89.0 Lymphedema, not elsewhere classified Follow-up Appointments Return appointment in 3 weeks. - Dr Lady Gary Room 3 - please make appt. Anesthetic (In clinic) Topical Lidocaine 5% applied to wound bed Bathing/ Shower/ Hygiene May shower and wash wound with soap and water. Edema Control - Lymphedema / SCD / Other Elevate legs to the level of the heart or above for 30 minutes daily and/or when sitting for 3-4 times a day throughout the day. Avoid standing for long periods of time. Patient to wear own compression stockings every day. - to right leg Moisturize legs daily. - Sween lotion Wound Treatment Wound #1 - Foot Wound Laterality: Dorsal, Left Cleanser: Soap and Water 1 x Per Day/30 Days Discharge Instructions: May shower and wash wound with dial antibacterial soap and water prior to dressing change. Cleanser: Wound  Cleanser (Generic) 1 x Per Day/30 Days Discharge Instructions: Cleanse the wound with wound cleanser prior to applying a clean dressing using gauze sponges, not tissue or cotton balls. Peri-Wound Care: Triamcinolone 15 (g) (Generic) 1 x Per Day/30 Days Discharge Instructions: Use triamcinolone 15 (g) as directed Peri-Wound Care: Sween Lotion (Moisturizing lotion) (Generic) 1 x Per Day/30 Days Discharge Instructions: Apply moisturizing lotion as directed Topical: Gentamicin 1 x Per Day/30 Days Discharge Instructions: As directed by physician  Topical: Mupirocin Ointment 1 x Per Day/30 Days Discharge Instructions: Apply Mupirocin (Bactroban) as instructed Prim Dressing: Maxorb Extra Ag+ Alginate Dressing, 4x4.75 (in/in) (Generic) 1 x Per Day/30 Days ary NASHIYAH, HOFMEYER ([id number]) [id number] Page 4 of 8 Discharge Instructions: Apply to wound bed as instructed Secondary Dressing: Zetuvit Plus Silicone Border Dressing 4x4 (in/in) (Generic) 1 x Per Day/30 Days Discharge Instructions: Apply silicone border over primary dressing as directed. Electronic Signature(s) Signed: 09/09/2022 5:03:18 PM By: Duanne Guess MD FACS Entered By: Duanne Guess on 09/09/2022 16:59:13 -------------------------------------------------------------------------------- Problem List Details Patient Name: Date of Service: [name], [name] 09/09/2022 4:00 PM Medical Record Number: [id number] Patient Account Number: [id number] Date of Birth/Sex: Treating RN: Jul 11, 1945 (77 y.o. [name] Primary Care Provider: [place] Blas, [place] NCIS Other Clinician: Referring Provider: Treating Provider/Extender: Ramon Dredge in Treatment: 16 Active Problems ICD-10 Encounter Code Description Active Date MDM Diagnosis L97.522 Non-pressure chronic ulcer of other part of left foot with fat layer exposed 05/20/2022 No Yes I87.2 Venous insufficiency (chronic) (peripheral)  05/20/2022 No Yes I89.0 Lymphedema, not elsewhere classified 05/20/2022 No Yes Inactive Problems Resolved Problems Electronic Signature(s) Signed: 09/09/2022 4:52:47 PM By: Duanne Guess MD FACS Entered By: Duanne Guess on 09/09/2022 16:52:47 -------------------------------------------------------------------------------- Progress Note Details Patient Name: Date of Service: [name], [name] 09/09/2022 4:00 PM Medical Record Number: [id number] Patient Account Number: [id number] Date of Birth/Sex: Treating RN: 1945/07/27 (77 y.o. F) Primary Care Provider: [place] Blas, [place] NCIS Other Clinician: Referring Provider: Treating Provider/Extender: Ramon Dredge in Treatment: [id number] [place] C ([id number]) [id number] Page 5 of 8 Chief Complaint Information obtained from Patient Patient seen for complaints of Non-Healing Wound. History of Present Illness (HPI) ADMISSION 05/20/2022 This is a 77 year old woman with a chronic wound on her left foot. She has a remote history of trauma status post surgery and grafting many years ago. She reports that her dog stepped on her foot in December and the wound opened at that time. She was initially treated by her primary care doctor with mupirocin and a 10-day course of Bactrim for suspected cellulitis, back in February of this year. She is not diabetic and does not smoke. She was seen by Dr. Lajoyce Corners on March 26, 2022 and diagnosed with stasis dermatitis and chronic venous insufficiency. He prescribed compression stockings. His note makes no mention of the wound. She has not been wearing the compression stockings because she finds them difficult to apply. She reports wearing compression leggings, but is not wearing them today and cannot tell me what degree of compression they provide. 05/29/2022: There is just 1 open area remaining on the dorsum of her foot. There is a little slough and eschar  accumulation. She brought in her compression tights and although the degree of compression is not indicated, based on my own stretching and testing of the tensile strength, the best pair seems to be around 30 mmHg. She has some that are a little less snug and I told her that I prefer her to use the tighter ones. Her venous reflux studies are scheduled for next Friday at 3 PM. 06/10/2022: She did not make it to her venous reflux studies last week. The wound on the dorsum of her foot remains open with a little slough and eschar accumulation. She is not wearing her compression garment. 07/01/2022: Her venous reflux studies were done and were negative. The wound on the dorsum of her foot is smaller with slough and eschar buildup  again. She is wearing her compression garments. 07/15/2022: The wound measures smaller again today but does persist. There is a little bit of slough accumulation. No concern for infection. 07/29/2022: The wound is a little bit smaller again today. There is slough accumulation, but no other debris. 08/10/2022: No change to the wound dimensions today. There is slough and eschar accumulation. 08/26/2022: The wound is down to a tiny pinhole underlying some eschar. 09/09/2022: Once again, a tiny pinhole opening remains with some eschar accumulation. Patient History Family History Cancer - Mother, Diabetes, Heart Disease, Hypertension, No family history of Hereditary Spherocytosis, Kidney Disease, Lung Disease, Seizures, Stroke, Thyroid Problems, Tuberculosis. Social History Never smoker, Marital Status - Divorced, Alcohol Use - Never, Drug Use - No History, Caffeine Use - Daily. Medical History Respiratory Patient has history of Asthma Cardiovascular Patient has history of Hypertension, Peripheral Venous Disease Musculoskeletal Patient has history of Osteoarthritis Hospitalization/Surgery History - total hip arthroplasty. - elbow arthroscopy with tendon reconstruction. - foot  surgery. Objective Constitutional Hypertensive, asymptomatic. no acute distress. Vitals Time Taken: 4:04 PM, Height: 63 in, Weight: 210 lbs, BMI: 37.2, Temperature: 97.6 F, Pulse: 76 bpm, Respiratory Rate: 18 breaths/min, Blood Pressure: 158/83 mmHg. Respiratory Normal work of breathing on room air. General Notes: 09/09/2022: Once again, a tiny pinhole opening remains with some eschar accumulation. Integumentary (Hair, Skin) Wound #1 status is Open. Original cause of wound was Trauma. The date acquired was: 12/24/2021. The wound has been in treatment 16 weeks. The wound is located on the Left,Dorsal Foot. The wound measures 0.2cm length x 0.2cm width x 0.1cm depth; 0.031cm^2 area and 0.003cm^3 volume. There is Fat Layer (Subcutaneous Tissue) exposed. There is no tunneling or undermining noted. There is a medium amount of serosanguineous drainage noted. The wound margin is distinct with the outline attached to the wound base. There is small (1-33%) red granulation within the wound bed. There is a large (67-100%) amount of necrotic tissue within the wound bed including Eschar and Adherent Slough. The periwound skin appearance had no abnormalities noted for color. The periwound skin appearance exhibited: Scarring, Dry/Scaly. Periwound temperature was noted as No Abnormality. TYSHIRA, PRAKASH ([id number]) [id number] Page 6 of 8 Assessment Active Problems ICD-10 Non-pressure chronic ulcer of other part of left foot with fat layer exposed Venous insufficiency (chronic) (peripheral) Lymphedema, not elsewhere classified Procedures Wound #1 Pre-procedure diagnosis of Wound #1 is a Lymphedema located on the Left,Dorsal Foot . There was a Selective/Open Wound Non-Viable Tissue Debridement with a total area of 0.03 sq cm performed by Duanne Guess, MD. With the following instrument(s): Curette to remove Non-Viable tissue/material. Material removed includes Eschar and  Slough and. No specimens were taken. A time out was conducted at 16:13, prior to the start of the procedure. A Minimum amount of bleeding was controlled with Pressure. The procedure was tolerated well. Post Debridement Measurements: 0.2cm length x 0.2cm width x 0.1cm depth; 0.003cm^3 volume. Character of Wound/Ulcer Post Debridement is stable. Post procedure Diagnosis Wound #1: Same as Pre-Procedure General Notes: : Scribed for Dr. Lady Gary by Brenton Grills, RN. Plan Follow-up Appointments: Return appointment in 3 weeks. - Dr Lady Gary Room 3 - please make appt. Anesthetic: (In clinic) Topical Lidocaine 5% applied to wound bed Bathing/ Shower/ Hygiene: May shower and wash wound with soap and water. Edema Control - Lymphedema / SCD / Other: Elevate legs to the level of the heart or above for 30 minutes daily and/or when sitting for 3-4 times a day throughout the day. Avoid standing  for long periods of time. Patient to wear own compression stockings every day. - to right leg Moisturize legs daily. - Sween lotion WOUND #1: - Foot Wound Laterality: Dorsal, Left Cleanser: Soap and Water 1 x Per Day/30 Days Discharge Instructions: May shower and wash wound with dial antibacterial soap and water prior to dressing change. Cleanser: Wound Cleanser (Generic) 1 x Per Day/30 Days Discharge Instructions: Cleanse the wound with wound cleanser prior to applying a clean dressing using gauze sponges, not tissue or cotton balls. Peri-Wound Care: Triamcinolone 15 (g) (Generic) 1 x Per Day/30 Days Discharge Instructions: Use triamcinolone 15 (g) as directed Peri-Wound Care: Sween Lotion (Moisturizing lotion) (Generic) 1 x Per Day/30 Days Discharge Instructions: Apply moisturizing lotion as directed Topical: Gentamicin 1 x Per Day/30 Days Discharge Instructions: As directed by physician Topical: Mupirocin Ointment 1 x Per Day/30 Days Discharge Instructions: Apply Mupirocin (Bactroban) as instructed Prim  Dressing: Maxorb Extra Ag+ Alginate Dressing, 4x4.75 (in/in) (Generic) 1 x Per Day/30 Days ary Discharge Instructions: Apply to wound bed as instructed Secondary Dressing: Zetuvit Plus Silicone Border Dressing 4x4 (in/in) (Generic) 1 x Per Day/30 Days Discharge Instructions: Apply silicone border over primary dressing as directed. 09/09/2022: Once again, a tiny pinhole opening remains with some eschar accumulation. I used a curette to debride the slough and eschar from the wound. I then debrided a little bit of the skin around the wound orifice to try and stimulate the healing cascade. We will continue topical gentamicin and mupirocin with silver alginate. Per patient request, she will return in 3 weeks. Electronic Signature(s) Signed: 09/09/2022 5:02:43 PM By: Duanne Guess MD FACS Entered By: Duanne Guess on 09/09/2022 17:02:43 Lattie Corns ([id number]) [id number] Page 7 of 8 -------------------------------------------------------------------------------- HxROS Details Patient Name: Date of Service: [name], [name] TRICIA C. 09/09/2022 4:00 PM Medical Record Number: [id number] Patient Account Number: [id number] Date of Birth/Sex: Treating RN: 12-15-1945 (77 y.o. F) Primary Care Provider: [place] Blas, [place] NCIS Other Clinician: Referring Provider: Treating Provider/Extender: Ramon Dredge in Treatment: 16 Respiratory Medical History: Positive for: Asthma Cardiovascular Medical History: Positive for: Hypertension; Peripheral Venous Disease Musculoskeletal Medical History: Positive for: Osteoarthritis Immunizations Pneumococcal Vaccine: Received Pneumococcal Vaccination: No Implantable Devices None Hospitalization / Surgery History Type of Hospitalization/Surgery total hip arthroplasty elbow arthroscopy with tendon reconstruction foot surgery Family and Social History Cancer: Yes - Mother; Diabetes: Yes; Heart Disease: Yes;  Hereditary Spherocytosis: No; Hypertension: Yes; Kidney Disease: No; Lung Disease: No; Seizures: No; Stroke: No; Thyroid Problems: No; Tuberculosis: No; Never smoker; Marital Status - Divorced; Alcohol Use: Never; Drug Use: No History; Caffeine Use: Daily; Financial Concerns: No; Food, Clothing or Shelter Needs: No; Support System Lacking: No; Transportation Concerns: No Psychologist, prison and probation services) Signed: 09/09/2022 5:03:18 PM By: Duanne Guess MD FACS Entered By: Duanne Guess on 09/09/2022 16:58:02 -------------------------------------------------------------------------------- SuperBill Details Patient Name: Date of Service: [name], [name] 09/09/2022 Medical Record Number: [id number] Patient Account Number: [id number] Date of Birth/Sex: Treating RN: 06/26/1945 (77 y.o. F) Primary Care Provider: [place] Blas, [place] NCIS Other Clinician: Referring Provider: Treating Provider/Extender: Ramon Dredge in Treatment: 16 Diagnosis Coding ICD-10 Codes Code Description [phone] Non-pressure chronic ulcer of other part of left foot with fat layer exposed I87.2 Venous insufficiency (chronic) (peripheral) I89.0 Lymphedema, not elsewhere classified Goon, Harlean C ([id number]) [id number] Page 8 of 8 Facility Procedures : 7 CPT4 Code: [id number] Description: [id number] - DEBRIDE WOUND 1ST 20 SQ CM OR < ICD-10 Diagnosis Description L97.522 Non-pressure chronic ulcer of other part  of left foot with fat layer exposed Modifier: Quantity: 1 Physician Procedures : CPT4 Code Description Modifier [id number] - WC PHYS LEVEL 4 - EST PT 25 ICD-10 Diagnosis Description L97.522 Non-pressure chronic ulcer of other part of left foot with fat layer exposed I87.2 Venous insufficiency (chronic) (peripheral) I89.0  Lymphedema, not elsewhere classified Quantity: 1 : [id number] - WC PHYS DEBR WO ANESTH 20 SQ CM ICD-10 Diagnosis Description L97.522 Non-pressure chronic  ulcer of other part of left foot with fat layer exposed Quantity: 1 Electronic Signature(s) Signed: 09/09/2022 5:03:02 PM By: Duanne Guess MD FACS Entered By: Duanne Guess on 09/09/2022 17:03:01

## 2022-09-29 ENCOUNTER — Ambulatory Visit (INDEPENDENT_AMBULATORY_CARE_PROVIDER_SITE_OTHER): Payer: Medicare Other | Admitting: Orthopedic Surgery

## 2022-09-29 DIAGNOSIS — I872 Venous insufficiency (chronic) (peripheral): Secondary | ICD-10-CM

## 2022-09-29 DIAGNOSIS — B351 Tinea unguium: Secondary | ICD-10-CM

## 2022-09-30 ENCOUNTER — Encounter (HOSPITAL_BASED_OUTPATIENT_CLINIC_OR_DEPARTMENT_OTHER): Payer: Medicare Other | Admitting: General Surgery

## 2022-10-04 ENCOUNTER — Encounter: Payer: Self-pay | Admitting: Orthopedic Surgery

## 2022-10-04 NOTE — Progress Notes (Signed)
Office Visit Note   Patient: [name]           Date of Birth: July 05, 1945           MRN: [id number] Visit Date: 09/29/2022              Requested by: No referring provider defined for this encounter. PCP: Ileana Ladd, MD (Inactive)  Chief Complaint  Patient presents with   Right Foot - Follow-up   Left Foot - Follow-up      HPI: Patient is a 77 year old woman who presents in follow-up for both lower extremities.  Patient also complains of right hand pain.  Assessment & Plan: Visit Diagnoses:  1. Onychomycosis   2. Venous insufficiency (chronic) (peripheral)     Plan: Follow-up in 3 months for evaluation lower extremities follow-up with Dr. Allyne Gee for examination of her right hand.  Follow-Up Instructions: Return in about 3 months (around 12/29/2022).   Ortho Exam  Patient is alert, oriented, no adenopathy, well-dressed, normal affect, normal respiratory effort. Examination patient has venous swelling but not open ulcers.  The dermatitis on the left foot is improving.  She has thickened discolored onychomycotic nails x 10 that she is unable to safely trim the nails were trimmed x 10 without complications.  Imaging: No results found. No images are attached to the encounter.  Labs: No results found for: "HGBA1C", "ESRSEDRATE", "CRP", "LABURIC", "REPTSTATUS", "GRAMSTAIN", "CULT", "LABORGA"   Lab Results  Component Value Date   ALBUMIN 3.2 (L) 06/30/2010    No results found for: "MG" No results found for: "VD25OH"  No results found for: "PREALBUMIN"    Latest Ref Rng & Units 10/02/2015    5:33 AM 09/25/2015    1:58 PM 06/30/2010    2:30 PM  CBC EXTENDED  WBC 4.0 - 10.5 K/uL 6.8  5.9  6.5   RBC 3.87 - 5.11 MIL/uL 3.88  4.83  4.06   Hemoglobin 12.0 - 15.0 g/dL 69.6  29.5  28.4   HCT 36.0 - 46.0 % 33.8  42.5  34.5   Platelets 150 - 400 K/uL 146  186  187   NEUT# 1.7 - 7.7 K/uL   4.0   Lymph# 0.7 - 4.0 K/uL   1.9      There is no height or weight  on file to calculate BMI.  Orders:  No orders of the defined types were placed in this encounter.  No orders of the defined types were placed in this encounter.    Procedures: No procedures performed  Clinical Data: No additional findings.  ROS:  All other systems negative, except as noted in the HPI. Review of Systems  Objective: Vital Signs: There were no vitals taken for this visit.  Specialty Comments:  No specialty comments available.  PMFS History: Patient Active Problem List   Diagnosis Date Noted   Unilateral primary osteoarthritis, left knee 10/03/2018   Unilateral primary osteoarthritis, right knee 10/03/2018   Osteoarthritis of both knees 10/31/2015   Status post total replacement of right hip 10/01/2015   Past Medical History:  Diagnosis Date   Arthritis    Asthma    Hypertension    Osteoarthritis of right hip 10/01/2015   Seasonal allergies     Family History  Problem Relation Age of Onset   Cancer Mother    Breast cancer Neg Hx     Past Surgical History:  Procedure Laterality Date   ELBOW ARTHROSCOPY WITH TENDON RECONSTRUCTION  FOOT SURGERY     TOTAL HIP ARTHROPLASTY Right 10/01/2015   Procedure: RIGHT TOTAL HIP ARTHROPLASTY ANTERIOR APPROACH;  Surgeon: Kathryne Hitch, MD;  Location: [facility] OR;  Service: Orthopedics;  Laterality: Right;   Social History   Occupational History   Not on file  Tobacco Use   Smoking status: Former    Current packs/day: 0.00    Types: Cigarettes    Quit date: 01/05/1978    Years since quitting: 44.7   Smokeless tobacco: Never  Substance and Sexual Activity   Alcohol use: No   Drug use: No   Sexual activity: Not on file

## 2022-10-14 ENCOUNTER — Encounter (HOSPITAL_BASED_OUTPATIENT_CLINIC_OR_DEPARTMENT_OTHER): Payer: Medicare Other | Attending: General Surgery | Admitting: General Surgery

## 2022-10-14 DIAGNOSIS — L97522 Non-pressure chronic ulcer of other part of left foot with fat layer exposed: Secondary | ICD-10-CM | POA: Insufficient documentation

## 2022-10-14 DIAGNOSIS — I890 Lymphedema, not elsewhere classified: Secondary | ICD-10-CM | POA: Insufficient documentation

## 2022-10-14 DIAGNOSIS — I872 Venous insufficiency (chronic) (peripheral): Secondary | ICD-10-CM | POA: Insufficient documentation

## 2022-10-14 NOTE — Progress Notes (Signed)
COLTON, ENGDAHL ([id number]) [phone].pdf Page 1 of 8 Visit Report for 10/14/2022 Arrival Information Details Patient Name: Date of Service: [name], [name] 10/14/2022 3:15 PM Medical Record Number: [id number] Patient Account Number: [id number] Date of Birth/Sex: Treating RN: 1945/02/21 (77 y.o. F) Primary Care [name]: [name], [name] Other Clinician: Referring [name]: Treating [name]/Extender: [name] in Treatment: 21 Visit Information History Since Last Visit Added or deleted any medications: No Patient Arrived: Cane Any new allergies or adverse reactions: No Arrival Time: 15:18 Had a fall or experienced change in No Accompanied By: self activities of daily living that may affect Transfer Assistance: None risk of falls: Patient Identification Verified: Yes Signs or symptoms of abuse/neglect since last visito No Secondary Verification Process Completed: Yes Hospitalized since last visit: No Patient Requires Transmission-Based Precautions: No Implantable device outside of the clinic excluding No Patient Has Alerts: No cellular tissue based products placed in the center since last visit: Pain Present Now: No Electronic Signature(s) Signed: 10/14/2022 3:[name]:58 PM By: [name] Entered By: [name] on 10/14/2022 12:18:33 -------------------------------------------------------------------------------- Encounter Discharge Information Details Patient Name: Date of Service: Krista DGES, Krista Krista C. 10/14/2022 3:15 PM Medical Record Number: [id number] Patient Account Number: [id number] Date of Birth/Sex: Treating RN: 1945-05-21 (77 y.o. [name] Primary Care [name]: [name], [name] Other Clinician: Referring Namine Beahm: Treating [name]/Extender: [name] in Treatment: 21 Encounter Discharge Information Items Post Procedure Vitals Discharge Condition: Stable Temperature (F):  97.6 Ambulatory Status: Cane Pulse (bpm): 82 Discharge Destination: Home Respiratory Rate (breaths/min): 18 Transportation: Private Auto Blood Pressure (mmHg): 118/64 Accompanied By: self Schedule Follow-up Appointment: Yes Clinical Summary of Care: Patient Declined Electronic Signature(s) Unsigned Entered By: [name] on 10/14/2022 12:58:12 Signature(s): [name] ([id number]) [phone] Date(s): [phone].pdf Page 2 of 8 -------------------------------------------------------------------------------- Lower Extremity Assessment Details Patient Name: Date of Service: [name], [name] 10/14/2022 3:15 PM Medical Record Number: [id number] Patient Account Number: [id number] Date of Birth/Sex: Treating RN: [name], 1947 (77 y.o. [name] Primary Care Taevion Sikora: [place] [name], FRA [name] Other Clinician: Referring [name]: Treating Jahking Lesser/Extender: [name] in Treatment: 21 Edema Assessment Assessed: [Left: No] [Right: No] [Left: Edema] [Right: :] Calf Left: Right: Point of Measurement: From Medial Instep 45.1 cm Ankle Left: Right: Point of Measurement: From Medial Instep [name].3 cm Vascular Assessment Pulses: Dorsalis Pedis Palpable: [Left:Yes] Extremity colors, hair growth, and conditions: Hair Growth on Extremity: [Left:Yes] Temperature of Extremity: [Left:Warm] Capillary Refill: [Left:< 3 seconds] Dependent Rubor: [Left:No No] Electronic Signature(s) Unsigned Entered By: [name] on 10/14/2022 12:34:59 -------------------------------------------------------------------------------- Multi Wound Chart Details Patient Name: Date of Service: [name], [name] 10/14/2022 3:15 PM Medical Record Number: [id number] Patient Account Number: [id number] Date of Birth/Sex: Treating RN: [name] (77 y.o. F) Primary Care [name]: [name], [name] Other Clinician: Referring [name]: Treating [name]/Extender:  [name] in Treatment: 21 Vital Signs Height(in): 63 Pulse(bpm): 71 Weight(lbs): 210 Blood Pressure(mmHg): 162/93 Body Mass Index(BMI): 37.2 Temperature(F): 98.4 Respiratory Rate(breaths/min): 18 [1:Photos:] [N/A:N/A] Left, Dorsal Foot N/A N/A Wound Location: Trauma N/A N/A Wounding Event: [name] N/A N/A Primary Etiology: Asthma, Hypertension, Peripheral N/A N/A Comorbid History: Venous Disease, Osteoarthritis 12/24/2021 N/A N/A Date Acquired: 21 N/A N/A Weeks of Treatment: Open N/A N/A Wound Status: No N/A N/A Wound Recurrence: 0.2x0.2x0.1 N/A N/A Measurements L x W x D (cm) 0.031 N/A N/A A (cm) : rea 0.003 N/A N/A Volume (cm) : 95.30% N/A N/A %  Reduction in A rea: 97.70% N/A N/A % Reduction in Volume: Full Thickness Without Exposed N/A N/A Classification: Support Structures Medium N/A N/A Exudate A mount: Serosanguineous N/A N/A Exudate Type: red, brown N/A N/A Exudate Color: Distinct, outline attached N/A N/A Wound Margin: Small (1-33%) N/A N/A Granulation A mount: Red N/A N/A Granulation Quality: Large (67-100%) N/A N/A Necrotic A mount: Eschar, Adherent Slough N/A N/A Necrotic Tissue: Fat Layer (Subcutaneous Tissue): Yes N/A N/A Exposed Structures: Fascia: No Tendon: No Muscle: No Joint: No Bone: No Medium (34-66%) N/A N/A Epithelialization: Debridement - Selective/Open Wound N/A N/A Debridement: Pre-procedure Verification/Time Out 15:47 N/A N/A Taken: Lidocaine 4% Topical Solution N/A N/A Pain Control: Necrotic/Eschar, Slough N/A N/A Tissue Debrided: Non-Viable Tissue N/A N/A Level: 0.03 N/A N/A Debridement A (sq cm): rea Curette N/A N/A Instrument: Minimum N/A N/A Bleeding: Pressure N/A N/A Hemostasis A chieved: Procedure was tolerated well N/A N/A Debridement Treatment Response: 0.2x0.2x0.1 N/A N/A Post Debridement Measurements L x W x D (cm) 0.003 N/A N/A Post Debridement Volume:  (cm) Scarring: Yes N/A N/A Periwound Skin Texture: Dry/Scaly: Yes N/A N/A Periwound Skin Moisture: No Abnormalities Noted N/A N/A Periwound Skin Color: No Abnormality N/A N/A Temperature: Debridement N/A N/A Procedures Performed: Treatment Notes Wound #1 (Foot) Wound Laterality: Dorsal, Left Cleanser Soap and Water Discharge Instruction: May shower and wash wound with dial antibacterial soap and water prior to dressing change. Wound Cleanser Discharge Instruction: Cleanse the wound with wound cleanser prior to applying a clean dressing using gauze sponges, not tissue or cotton balls. Peri-Wound Care Triamcinolone 15 (g) Discharge Instruction: Use triamcinolone 15 (g) as directed Sween Lotion (Moisturizing lotion) Discharge Instruction: Apply moisturizing lotion as directed Topical Gentamicin Discharge Instruction: As directed by physician Mupirocin Ointment Discharge Instruction: Apply Mupirocin (Bactroban) as instructed Dowdle, Darchelle C ([id number]) [id number] Page 4 of 8 Primary Dressing Promogran Prisma Matrix, 4.34 (sq in) (silver collagen) Discharge Instruction: Moisten collagen with saline or hydrogel Secondary Dressing Zetuvit Plus Silicone Border Dressing 4x4 (in/in) Discharge Instruction: Apply silicone border over primary dressing as directed. Secured With Compression Wrap Compression Stockings Facilities manager) Signed: 10/14/2022 4:17:31 PM By: Duanne Guess MD FACS Entered By: Duanne Guess on 10/14/2022 13:17:31 -------------------------------------------------------------------------------- Multi-Disciplinary Care Plan Details Patient Name: Date of Service: Krista DGES, Krista Krista C. 10/14/2022 3:15 PM Medical Record Number: [id number] Patient Account Number: [id number] Date of Birth/Sex: Treating RN: 26-Sep-1945 (77 y.o. [name] Primary Care Idaliz Tinkle: [place] [name], [name] Other Clinician: Referring  Qiana Landgrebe: Treating Trenten Watchman/Extender: [name] in Treatment: 21 Active Inactive Wound/Skin Impairment Nursing Diagnoses: Impaired tissue integrity Knowledge deficit related to ulceration/compromised skin integrity Goals: Patient/caregiver will verbalize understanding of skin care regimen Date Initiated: 05/20/2022 Target Resolution Date: 09/04/2022 Goal Status: Active Interventions: Assess ulceration(s) every visit Treatment Activities: Skin care regimen initiated : 05/20/2022 Topical wound management initiated : 05/20/2022 Notes: Electronic Signature(s) Unsigned Entered By: [name] on 10/14/2022 12:39:28 Signature(s): RACHAL, DVORSKY ([id number]) [id number] Date(s): [phone].pdf Page 5 of 8 -------------------------------------------------------------------------------- Pain Assessment Details Patient Name: Date of Service: Krista Bicknell, [name] 10/14/2022 3:15 PM Medical Record Number: [id number] Patient Account Number: [id number] Date of Birth/Sex: Treating RN: 1945/11/12 (77 y.o. F) Primary Care Morgana Rowley: [place] [name], [name] Other Clinician: Referring Angla Delahunt: Treating Bhakti Labella/Extender: [name] in Treatment: 21 Active Problems Location of Pain Severity and Description of Pain Patient Has Paino No Site Locations Pain Management and Medication Current Pain Management: Electronic Signature(s) Signed: 10/14/2022 3:[name]:58 PM By: [name] Entered  ByDayton [name] on 10/14/2022 12:19:08 -------------------------------------------------------------------------------- Patient/Caregiver Education Details Patient Name: Date of Service: Krista DGES, Krista [name]. 10/9/2024andnbsp3:15 PM Medical Record Number: [id number] Patient Account Number: [id number] Date of Birth/Gender: Treating RN: Mar 02, 1945 (77 y.o. [name] Primary Care Physician: [place] [name], [name] Other Clinician: Referring  Physician: Treating Physician/Extender: [name] in Treatment: 21 Education Assessment Education Provided To: Patient Education Topics Provided Wound/Skin Impairment: Methods: Explain/Verbal Responses: State content correctly ALIKA, EPPES C ([id number]) [phone].pdf Page 6 of 8 Electronic Signature(s) Unsigned Entered By: [name] on 10/14/2022 12:39:47 -------------------------------------------------------------------------------- Wound Assessment Details Patient Name: Date of Service: [place], [name] 10/14/2022 3:15 PM Medical Record Number: [id number] Patient Account Number: [id number] Date of Birth/Sex: Treating RN: 1945/01/14 (77 y.o. F) Primary Care Arraya Buck: [name], [name] Other Clinician: Referring Bernardo Brayman: Treating Domonic Hiscox/Extender: [name] in Treatment: 21 Wound Status Wound Number: 1 Primary [name] Etiology: Wound Location: Left, Dorsal Foot Wound Status: Open Wounding Event: Trauma Comorbid Asthma, Hypertension, Peripheral Venous Disease, Date Acquired: 12/24/2021 History: Osteoarthritis Weeks Of Treatment: 21 Clustered Wound: No Photos Wound Measurements Length: (cm) 0.2 Width: (cm) 0.2 Depth: (cm) 0.1 Area: (cm) 0.031 Volume: (cm) 0.003 % Reduction in Area: 95.3% % Reduction in Volume: 97.7% Epithelialization: Medium (34-66%) Wound Description Classification: Full Thickness Without Exposed Suppor Wound Margin: Distinct, outline attached Exudate Amount: Medium Exudate Type: Serosanguineous Exudate Color: red, brown t Structures Foul Odor After Cleansing: No Slough/Fibrino Yes Wound Bed Granulation Amount: Small (1-33%) Exposed Structure Granulation Quality: Red Fascia Exposed: No Necrotic Amount: Large (67-100%) Fat Layer (Subcutaneous Tissue) Exposed: Yes Necrotic Quality: Eschar, Adherent Slough Tendon Exposed: No Muscle Exposed: No Joint  Exposed: No Bone Exposed: No Periwound Skin Texture Texture Color No Abnormalities Noted: No No Abnormalities Noted: Yes Scarring: Yes Temperature / Pain Joye, Nirvana C ([id number]) [id number] Page 7 of 8 Temperature: No Abnormality Moisture No Abnormalities Noted: No Dry / Scaly: Yes Treatment Notes Wound #1 (Foot) Wound Laterality: Dorsal, Left Cleanser Soap and Water Discharge Instruction: May shower and wash wound with dial antibacterial soap and water prior to dressing change. Wound Cleanser Discharge Instruction: Cleanse the wound with wound cleanser prior to applying a clean dressing using gauze sponges, not tissue or cotton balls. Peri-Wound Care Triamcinolone 15 (g) Discharge Instruction: Use triamcinolone 15 (g) as directed Sween Lotion (Moisturizing lotion) Discharge Instruction: Apply moisturizing lotion as directed Topical Gentamicin Discharge Instruction: As directed by physician Mupirocin Ointment Discharge Instruction: Apply Mupirocin (Bactroban) as instructed Primary Dressing Promogran Prisma Matrix, 4.34 (sq in) (silver collagen) Discharge Instruction: Moisten collagen with saline or hydrogel Secondary Dressing Zetuvit Plus Silicone Border Dressing 4x4 (in/in) Discharge Instruction: Apply silicone border over primary dressing as directed. Secured With Compression Wrap Compression Stockings Facilities manager) Signed: 10/14/2022 3:[name]:58 PM By: [name] Entered By: [name] on 10/14/2022 12:22:52 -------------------------------------------------------------------------------- Vitals Details Patient Name: Date of Service: Krista DGES, Krista Krista C. 10/14/2022 3:15 PM Medical Record Number: [id number] Patient Account Number: [id number] Date of Birth/Sex: Treating RN: 09/17/1945 (77 y.o. F) Primary Care Shirl Ludington: [name], [name] Other Clinician: Referring Alyssamarie Mounsey: Treating Cire Deyarmin/Extender: [name] in Treatment: 21 Vital Signs Time Taken: 03:18 Temperature (F): 98.4 Height (in): 63 Pulse (bpm): 71 Weight (lbs): 210 Respiratory Rate (breaths/min): 18 Body Mass Index (BMI): 37.2 Blood Pressure (mmHg): 162/93 Reference Range: 80 - 120 mg / dl Electronic Signature(s) Signed: 10/14/2022 3:[name]:58 PM By: Dorathy Daft, [name] ([id number]) [id number] Page 8 of  8 Entered By: [name] on 10/14/2022 12:19:01

## 2022-10-15 NOTE — Progress Notes (Signed)
[name], Krista ([id number]) [id number] Page 1 of 8 Visit Report for 10/14/2022 Chief Complaint Document Details Patient Name: Date of Service: [place], [name] 10/14/2022 3:15 PM Medical Record Number: [id number] Patient Account Number: [id number] Date of Birth/Sex: Treating RN: 06/22/1945 (77 y.o. F) Primary Care Provider: [place] Blas, [place] [facility] Other Clinician: Referring Provider: Treating Provider/Extender: Ramon Dredge in Treatment: 21 Information Obtained from: Patient Chief Complaint Patient seen for complaints of Non-Healing Wound. Electronic Signature(s) Signed: 10/14/2022 4:17:46 PM By: Duanne Guess MD FACS Entered By: Duanne Guess on 10/14/2022 13:17:46 -------------------------------------------------------------------------------- Debridement Details Patient Name: Date of Service: [name], [place] Krista C. 10/14/2022 3:15 PM Medical Record Number: [id number] Patient Account Number: [id number] Date of Birth/Sex: Treating RN: 1945/08/24 (77 y.o. Gevena [name] Primary Care Provider: [place] Blas, [place] [facility] Other Clinician: Referring Provider: Treating Provider/Extender: Ramon Dredge in Treatment: 21 Debridement Performed for Assessment: Wound #1 Left,Dorsal Foot Performed By: Physician Duanne Guess, MD The following information was scribed by: Brenton Grills The information was scribed for: Duanne Guess Debridement Type: Debridement Level of Consciousness (Pre-procedure): Awake and Alert Pre-procedure Verification/Time Out Yes - 15:47 Taken: Start Time: 15:48 Pain Control: Lidocaine 4% Topical Solution Percent of Wound Bed Debrided: 100% T Area Debrided (cm): otal 0.03 Tissue and other material debrided: Non-Viable, Eschar, Slough, Slough Level: Non-Viable Tissue Debridement Description: Selective/Open Wound Instrument: Curette Bleeding: Minimum Hemostasis Achieved: Pressure Response  to Treatment: Procedure was tolerated well Level of Consciousness (Post- Awake and Alert procedure): Post Debridement Measurements of Total Wound Length: (cm) 0.2 Width: (cm) 0.2 Depth: (cm) 0.1 Volume: (cm) 0.003 Character of Wound/Ulcer Post Debridement: Stable Post Procedure Diagnosis [name], Krista C ([id number]) [id number] Page 2 of 8 Same as Pre-procedure Electronic Signature(s) Signed: 10/15/2022 7:55:21 AM By: Duanne Guess MD FACS Signed: 10/15/2022 4:20:59 PM By: Brenton Grills Entered By: Brenton Grills on 10/14/2022 12:48:39 -------------------------------------------------------------------------------- HPI Details Patient Name: Date of Service: [name], PA Krista C. 10/14/2022 3:15 PM Medical Record Number: [id number] Patient Account Number: [id number] Date of Birth/Sex: Treating RN: [birth year] (77 y.o. F) Primary Care Provider: [place] Blas, [place] [facility] Other Clinician: Referring Provider: Treating Provider/Extender: Ramon Dredge in Treatment: 21 History of Present Illness HPI Description: ADMISSION 05/20/2022 This is a 77 year old woman with a chronic wound on her left foot. She has a remote history of trauma status post surgery and grafting many years ago. She reports that her dog stepped on her foot in December and the wound opened at that time. She was initially treated by her primary care doctor with mupirocin and a 10-day course of Bactrim for suspected cellulitis, back in February of this year. She is not diabetic and does not smoke. She was seen by Dr. Lajoyce Corners on March 26, 2022 and diagnosed with stasis dermatitis and chronic venous insufficiency. He prescribed compression stockings. His note makes no mention of the wound. She has not been wearing the compression stockings because she finds them difficult to apply. She reports wearing compression leggings, but is not wearing them today and cannot tell me what degree of  compression they provide. 05/29/2022: There is just 1 open area remaining on the dorsum of her foot. There is a little slough and eschar accumulation. She brought in her compression tights and although the degree of compression is not indicated, based on my own stretching and testing of the tensile strength, the best pair seems to be around 30 mmHg. She has some that are a little less  snug and I told her that I prefer her to use the tighter ones. Her venous reflux studies are scheduled for next Friday at 3 PM. 06/10/2022: She did not make it to her venous reflux studies last week. The wound on the dorsum of her foot remains open with a little slough and eschar accumulation. She is not wearing her compression garment. 07/01/2022: Her venous reflux studies were done and were negative. The wound on the dorsum of her foot is smaller with slough and eschar buildup again. She is wearing her compression garments. 07/15/2022: The wound measures smaller again today but does persist. There is a little bit of slough accumulation. No concern for infection. 07/29/2022: The wound is a little bit smaller again today. There is slough accumulation, but no other debris. 08/10/2022: No change to the wound dimensions today. There is slough and eschar accumulation. 08/26/2022: The wound is down to a tiny pinhole underlying some eschar. 09/09/2022: Once again, a tiny pinhole opening remains with some eschar accumulation. 10/14/2022: I have not seen this patient in over a month. In that time, the wound on her dorsal foot has gotten a little bit larger. There is slough and eschar on the surface. She does wear compression garments on her legs, but these are leggings and do not include her feet; her feet have 1+ pitting edema, which I suspect is part of the reason we are not making progress. Electronic Signature(s) Signed: 10/14/2022 4:20:30 PM By: Duanne Guess MD FACS Entered By: Duanne Guess on 10/14/2022  13:20:30 -------------------------------------------------------------------------------- Physical Exam Details Patient Name: Date of Service: [name], PA Krista C. 10/14/2022 3:15 PM Medical Record Number: [id number] Patient Account Number: [id number] Date of Birth/Sex: Treating RN: [birth year] (77 y.o. F) Primary Care Provider: [place] Blas, [place] [facility] Other ClinicianSHERRYE, PUGA ([id number]) [id number] Page 3 of 8 Referring Provider: Treating Provider/Extender: Ramon Dredge in Treatment: 21 Constitutional Hypertensive, asymptomatic. . . . no acute distress. Respiratory Normal work of breathing on room air. Notes 10/14/2022: The wound on her dorsal foot has gotten a little bit larger. There is slough and eschar on the surface. Her feet have 1+ pitting edema, which I suspect is part of the reason we are not making progress. Electronic Signature(s) Signed: 10/14/2022 4:21:36 PM By: Duanne Guess MD FACS Entered By: Duanne Guess on 10/14/2022 13:21:35 -------------------------------------------------------------------------------- Physician Orders Details Patient Name: Date of Service: [name], PA Krista C. 10/14/2022 3:15 PM Medical Record Number: [id number] Patient Account Number: [id number] Date of Birth/Sex: Treating RN: 06-May-1945 (77 y.o. Gevena [name] Primary Care Provider: [place] Blas, [place] [facility] Other Clinician: Referring Provider: Treating Provider/Extender: Ramon Dredge in Treatment: 21 The following information was scribed by: Brenton Grills The information was scribed for: Duanne Guess Verbal / Phone Orders: No Diagnosis Coding ICD-10 Coding Code Description [phone] Non-pressure chronic ulcer of other part of left foot with fat layer exposed I87.2 Venous insufficiency (chronic) (peripheral) I89.0 Lymphedema, not elsewhere classified Follow-up Appointments Return appointment in 3 weeks. -  Dr Lady Gary Room 3 - please make appt. Anesthetic (In clinic) Topical Lidocaine 5% applied to wound bed Bathing/ Shower/ Hygiene May shower and wash wound with soap and water. Edema Control - Lymphedema / SCD / Other Elevate legs to the level of the heart or above for 30 minutes daily and/or when sitting for 3-4 times a day throughout the day. Avoid standing for long periods of time. Patient to wear own compression stockings every day. - to bilateral  legs and feet. Wear compression while awake. Moisturize legs daily. - Sween lotion Wound Treatment Wound #1 - Foot Wound Laterality: Dorsal, Left Cleanser: Soap and Water 1 x Per Day/30 Days Discharge Instructions: May shower and wash wound with dial antibacterial soap and water prior to dressing change. Cleanser: Wound Cleanser (Generic) 1 x Per Day/30 Days Discharge Instructions: Cleanse the wound with wound cleanser prior to applying a clean dressing using gauze sponges, not tissue or cotton balls. Peri-Wound Care: Triamcinolone 15 (g) (Generic) 1 x Per Day/30 Days Discharge Instructions: Use triamcinolone 15 (g) as directed Peri-Wound Care: Sween Lotion (Moisturizing lotion) (Generic) 1 x Per Day/30 Days Discharge Instructions: Apply moisturizing lotion as directed KARIAH, LOREDO ([id number]) [id number] Page 4 of 8 Topical: Gentamicin 1 x Per Day/30 Days Discharge Instructions: As directed by physician Topical: Mupirocin Ointment 1 x Per Day/30 Days Discharge Instructions: Apply Mupirocin (Bactroban) as instructed Prim Dressing: Promogran Prisma Matrix, 4.34 (sq in) (silver collagen) 1 x Per Day/30 Days ary Discharge Instructions: Moisten collagen with saline or hydrogel Secondary Dressing: Zetuvit Plus Silicone Border Dressing 4x4 (in/in) (Generic) 1 x Per Day/30 Days Discharge Instructions: Apply silicone border over primary dressing as directed. Electronic Signature(s) Signed: 10/15/2022 7:55:21 AM By:  Duanne Guess MD FACS Entered By: Duanne Guess on 10/14/2022 13:21:59 -------------------------------------------------------------------------------- Problem List Details Patient Name: Date of Service: [name], PA Krista C. 10/14/2022 3:15 PM Medical Record Number: [id number] Patient Account Number: [id number] Date of Birth/Sex: Treating RN: 01-14-1945 (77 y.o. Gevena [name] Primary Care Provider: [place] Blas, [place] [facility] Other Clinician: Referring Provider: Treating Provider/Extender: Ramon Dredge in Treatment: 21 Active Problems ICD-10 Encounter Code Description Active Date MDM Diagnosis L97.522 Non-pressure chronic ulcer of other part of left foot with fat layer exposed 05/20/2022 No Yes I87.2 Venous insufficiency (chronic) (peripheral) 05/20/2022 No Yes I89.0 Lymphedema, not elsewhere classified 05/20/2022 No Yes Inactive Problems Resolved Problems Electronic Signature(s) Signed: 10/14/2022 4:18:58 PM By: Duanne Guess MD FACS Previous Signature: 10/14/2022 4:17:23 PM Version By: Duanne Guess MD FACS Entered By: Duanne Guess on 10/14/2022 13:18:57 -------------------------------------------------------------------------------- Progress Note Details Patient Name: Date of Service: [name], PA Krista C. 10/14/2022 3:15 PM Medical Record Number: [id number] Patient Account Number: [id number] GIANI, WINTHER ([id number]) [id number] Page 5 of 8 Date of Birth/Sex: Treating RN: 1945-02-20 (77 y.o. F) Primary Care Provider: [place] Blas, [place] [facility] Other Clinician: Referring Provider: Treating Provider/Extender: Ramon Dredge in Treatment: 21 Subjective Chief Complaint Information obtained from Patient Patient seen for complaints of Non-Healing Wound. History of Present Illness (HPI) ADMISSION 05/20/2022 This is a 77 year old woman with a chronic wound on her left foot. She has a remote history of trauma  status post surgery and grafting many years ago. She reports that her dog stepped on her foot in December and the wound opened at that time. She was initially treated by her primary care doctor with mupirocin and a 10-day course of Bactrim for suspected cellulitis, back in February of this year. She is not diabetic and does not smoke. She was seen by Dr. Lajoyce Corners on March 26, 2022 and diagnosed with stasis dermatitis and chronic venous insufficiency. He prescribed compression stockings. His note makes no mention of the wound. She has not been wearing the compression stockings because she finds them difficult to apply. She reports wearing compression leggings, but is not wearing them today and cannot tell me what degree of compression they provide. 05/29/2022: There is just 1 open area remaining on the dorsum  of her foot. There is a little slough and eschar accumulation. She brought in her compression tights and although the degree of compression is not indicated, based on my own stretching and testing of the tensile strength, the best pair seems to be around 30 mmHg. She has some that are a little less snug and I told her that I prefer her to use the tighter ones. Her venous reflux studies are scheduled for next Friday at 3 PM. 06/10/2022: She did not make it to her venous reflux studies last week. The wound on the dorsum of her foot remains open with a little slough and eschar accumulation. She is not wearing her compression garment. 07/01/2022: Her venous reflux studies were done and were negative. The wound on the dorsum of her foot is smaller with slough and eschar buildup again. She is wearing her compression garments. 07/15/2022: The wound measures smaller again today but does persist. There is a little bit of slough accumulation. No concern for infection. 07/29/2022: The wound is a little bit smaller again today. There is slough accumulation, but no other debris. 08/10/2022: No change to the wound  dimensions today. There is slough and eschar accumulation. 08/26/2022: The wound is down to a tiny pinhole underlying some eschar. 09/09/2022: Once again, a tiny pinhole opening remains with some eschar accumulation. 10/14/2022: I have not seen this patient in over a month. In that time, the wound on her dorsal foot has gotten a little bit larger. There is slough and eschar on the surface. She does wear compression garments on her legs, but these are leggings and do not include her feet; her feet have 1+ pitting edema, which I suspect is part of the reason we are not making progress. Patient History Family History Cancer - Mother, Diabetes, Heart Disease, Hypertension, No family history of Hereditary Spherocytosis, Kidney Disease, Lung Disease, Seizures, Stroke, Thyroid Problems, Tuberculosis. Social History Never smoker, Marital Status - Divorced, Alcohol Use - Never, Drug Use - No History, Caffeine Use - Daily. Medical History Respiratory Patient has history of Asthma Cardiovascular Patient has history of Hypertension, Peripheral Venous Disease Musculoskeletal Patient has history of Osteoarthritis Hospitalization/Surgery History - total hip arthroplasty. - elbow arthroscopy with tendon reconstruction. - foot surgery. Objective Constitutional Hypertensive, asymptomatic. no acute distress. Vitals Time Taken: 3:18 AM, Height: 63 in, Weight: 210 lbs, BMI: 37.2, Temperature: 98.4 F, Pulse: 71 bpm, Respiratory Rate: 18 breaths/min, Blood Pressure: 162/93 mmHg. Respiratory Normal work of breathing on room air. MELENDA, BIELAK ([id number]) [id number] Page 6 of 8 General Notes: 10/14/2022: The wound on her dorsal foot has gotten a little bit larger. There is slough and eschar on the surface. Her feet have 1+ pitting edema, which I suspect is part of the reason we are not making progress. Integumentary (Hair, Skin) Wound #1 status is Open. Original cause of wound was  Trauma. The date acquired was: 12/24/2021. The wound has been in treatment 21 weeks. The wound is located on the Left,Dorsal Foot. The wound measures 0.2cm length x 0.2cm width x 0.1cm depth; 0.031cm^2 area and 0.003cm^3 volume. There is Fat Layer (Subcutaneous Tissue) exposed. There is a medium amount of serosanguineous drainage noted. The wound margin is distinct with the outline attached to the wound base. There is small (1-33%) red granulation within the wound bed. There is a large (67-100%) amount of necrotic tissue within the wound bed including Eschar and Adherent Slough. The periwound skin appearance had no abnormalities noted for color. The periwound skin appearance  exhibited: Scarring, Dry/Scaly. Periwound temperature was noted as No Abnormality. Assessment Active Problems ICD-10 Non-pressure chronic ulcer of other part of left foot with fat layer exposed Venous insufficiency (chronic) (peripheral) Lymphedema, not elsewhere classified Procedures Wound #1 Pre-procedure diagnosis of Wound #1 is a Lymphedema located on the Left,Dorsal Foot . There was a Selective/Open Wound Non-Viable Tissue Debridement with a total area of 0.03 sq cm performed by Duanne Guess, MD. With the following instrument(s): Curette to remove Non-Viable tissue/material. Material removed includes Eschar and Slough and after achieving pain control using Lidocaine 4% T opical Solution. No specimens were taken. A time out was conducted at 15:47, prior to the start of the procedure. A Minimum amount of bleeding was controlled with Pressure. The procedure was tolerated well. Post Debridement Measurements: 0.2cm length x 0.2cm width x 0.1cm depth; 0.003cm^3 volume. Character of Wound/Ulcer Post Debridement is stable. Post procedure Diagnosis Wound #1: Same as Pre-Procedure Plan Follow-up Appointments: Return appointment in 3 weeks. - Dr Lady Gary Room 3 - please make appt. Anesthetic: (In clinic) Topical Lidocaine 5%  applied to wound bed Bathing/ Shower/ Hygiene: May shower and wash wound with soap and water. Edema Control - Lymphedema / SCD / Other: Elevate legs to the level of the heart or above for 30 minutes daily and/or when sitting for 3-4 times a day throughout the day. Avoid standing for long periods of time. Patient to wear own compression stockings every day. - to bilateral legs and feet. Wear compression while awake. Moisturize legs daily. - Sween lotion WOUND #1: - Foot Wound Laterality: Dorsal, Left Cleanser: Soap and Water 1 x Per Day/30 Days Discharge Instructions: May shower and wash wound with dial antibacterial soap and water prior to dressing change. Cleanser: Wound Cleanser (Generic) 1 x Per Day/30 Days Discharge Instructions: Cleanse the wound with wound cleanser prior to applying a clean dressing using gauze sponges, not tissue or cotton balls. Peri-Wound Care: Triamcinolone 15 (g) (Generic) 1 x Per Day/30 Days Discharge Instructions: Use triamcinolone 15 (g) as directed Peri-Wound Care: Sween Lotion (Moisturizing lotion) (Generic) 1 x Per Day/30 Days Discharge Instructions: Apply moisturizing lotion as directed Topical: Gentamicin 1 x Per Day/30 Days Discharge Instructions: As directed by physician Topical: Mupirocin Ointment 1 x Per Day/30 Days Discharge Instructions: Apply Mupirocin (Bactroban) as instructed Prim Dressing: Promogran Prisma Matrix, 4.34 (sq in) (silver collagen) 1 x Per Day/30 Days ary Discharge Instructions: Moisten collagen with saline or hydrogel Secondary Dressing: Zetuvit Plus Silicone Border Dressing 4x4 (in/in) (Generic) 1 x Per Day/30 Days Discharge Instructions: Apply silicone border over primary dressing as directed. 10/14/2022: I have not seen this patient in over a month. In that time, the wound on her dorsal foot has gotten a little bit larger. There is slough and eschar on the surface. She does wear compression garments on her legs, but these are  leggings and do not include her feet; her feet have 1+ pitting edema, which I suspect is part of the reason we are not making progress. I used a curette to debride slough and eschar from the wound. We discussed wrapping her leg to include her foot, but she is reluctant to permit this as she is more comfortable in her compression leggings. We talked about simply wrapping her foot, but she is unable to maneuver well enough to change the wrapping. Proposed elastic ankle brace that covers the top of her foot, similar to what is available over-the-counter for ankle sprains. She says that she has 1 that does cover  the part of the foot where her wound is so we will give this a try to see if we can get some more compression and get the edema out of her foot and potentially get the wound healed. We will continue the topical antibiotic ointment with silver alginate. Follow-up in 1 week. JAEDEN, WESTBAY ([id number]) [id number] Page 7 of 8 Electronic Signature(s) Signed: 10/14/2022 4:23:54 PM By: Duanne Guess MD FACS Entered By: Duanne Guess on 10/14/2022 13:23:53 -------------------------------------------------------------------------------- HxROS Details Patient Name: Date of Service: [name], PA Krista C. 10/14/2022 3:15 PM Medical Record Number: [id number] Patient Account Number: [id number] Date of Birth/Sex: Treating RN: [birth year] (77 y.o. F) Primary Care Provider: [place] Blas, [place] [facility] Other Clinician: Referring Provider: Treating Provider/Extender: Ramon Dredge in Treatment: 21 Respiratory Medical History: Positive for: Asthma Cardiovascular Medical History: Positive for: Hypertension; Peripheral Venous Disease Musculoskeletal Medical History: Positive for: Osteoarthritis Immunizations Pneumococcal Vaccine: Received Pneumococcal Vaccination: No Implantable Devices None Hospitalization / Surgery History Type of  Hospitalization/Surgery total hip arthroplasty elbow arthroscopy with tendon reconstruction foot surgery Family and Social History Cancer: Yes - Mother; Diabetes: Yes; Heart Disease: Yes; Hereditary Spherocytosis: No; Hypertension: Yes; Kidney Disease: No; Lung Disease: No; Seizures: No; Stroke: No; Thyroid Problems: No; Tuberculosis: No; Never smoker; Marital Status - Divorced; Alcohol Use: Never; Drug Use: No History; Caffeine Use: Daily; Financial Concerns: No; Food, Clothing or Shelter Needs: No; Support System Lacking: No; Transportation Concerns: No Electronic Signature(s) Signed: 10/15/2022 7:55:21 AM By: Duanne Guess MD FACS Entered By: Duanne Guess on 10/14/2022 13:20:54 -------------------------------------------------------------------------------- SuperBill Details Patient Name: Date of Service: [name], PA Krista C. 10/14/2022 Medical Record Number: [id number] Patient Account Number: [id number] Date of Birth/Sex: Treating RN: [birth year] (77 y.o. MALENE, BLAYDES ([id number]) [id number] Page 8 of 8 Primary Care Provider: [place], [place] [facility] Other Clinician: Referring Provider: Treating Provider/Extender: Ramon Dredge in Treatment: 21 Diagnosis Coding ICD-10 Codes Code Description [phone] Non-pressure chronic ulcer of other part of left foot with fat layer exposed I87.2 Venous insufficiency (chronic) (peripheral) I89.0 Lymphedema, not elsewhere classified Facility Procedures : CPT4 Code: [id number] Description: 97597 - DEBRIDE WOUND 1ST 20 SQ CM OR < ICD-10 Diagnosis Description L97.522 Non-pressure chronic ulcer of other part of left foot with fat layer exposed Modifier: Quantity: 1 Physician Procedures : CPT4 Code Description Modifier [id number] - WC PHYS LEVEL 4 - EST PT 25 ICD-10 Diagnosis Description L97.522 Non-pressure chronic ulcer of other part of left foot with fat layer exposed I87.2 Venous  insufficiency (chronic) (peripheral) I89.0  Lymphedema, not elsewhere classified Quantity: 1 : [id number] 97597 - WC PHYS DEBR WO ANESTH 20 SQ CM ICD-10 Diagnosis Description L97.522 Non-pressure chronic ulcer of other part of left foot with fat layer exposed Quantity: 1 Electronic Signature(s) Signed: 10/14/2022 4:24:18 PM By: Duanne Guess MD FACS Entered By: Duanne Guess on 10/14/2022 13:24:18

## 2022-11-04 ENCOUNTER — Encounter (HOSPITAL_BASED_OUTPATIENT_CLINIC_OR_DEPARTMENT_OTHER): Payer: Medicare Other | Admitting: General Surgery

## 2022-11-04 DIAGNOSIS — L97522 Non-pressure chronic ulcer of other part of left foot with fat layer exposed: Secondary | ICD-10-CM | POA: Diagnosis not present

## 2022-11-04 NOTE — Progress Notes (Addendum)
RHAPSODY, WOLVEN ([id number]) [phone].pdf Page 1 of 7 Visit Report for 11/04/2022 Arrival Information Details Patient Name: Date of Service: [name], [name] 11/04/2022 3:15 PM Medical Record Number: [id number] Patient Account Number: [id number] Date of Birth/Sex: Treating RN: 02-27-1945 (77 y.o. F) Primary Care Whitni Pasquini: [place], [place] Other Clinician: Referring Oda Placke: Treating Skyy Mcknight/Extender: Ramon Dredge in Treatment: 24 Visit Information History Since Last Visit Added or deleted any medications: No Patient Arrived: Ambulatory Any new allergies or adverse reactions: No Arrival Time: 15:30 Had a fall or experienced change in No Accompanied By: self activities of daily living that may affect Transfer Assistance: None risk of falls: Patient Identification Verified: Yes Signs or symptoms of abuse/neglect since last visito No Secondary Verification Process Completed: Yes Hospitalized since last visit: No Patient Requires Transmission-Based Precautions: No Implantable device outside of the clinic excluding No Patient Has Alerts: No cellular tissue based products placed in the center since last visit: Has Dressing in Place as Prescribed: Yes Pain Present Now: No Electronic Signature(s) Signed: 11/05/2022 5:49:41 PM By: Zenaida Deed RN, BSN Previous Signature: 11/04/2022 3:40:17 PM Version By: Karl Ito Entered By: Zenaida Deed on 11/04/2022 13:05:22 -------------------------------------------------------------------------------- Encounter Discharge Information Details Patient Name: Date of Service: [name], [name] 11/04/2022 3:15 PM Medical Record Number: [id number] Patient Account Number: [id number] Date of Birth/Sex: Treating RN: 10-29-1945 (77 y.o. Tommye Standard Primary Care Khaya Theissen: [place], [place] Other Clinician: Referring Reason Helzer: Treating Mellany Dinsmore/Extender: Ramon Dredge in Treatment: 24 Encounter Discharge Information Items Post Procedure Vitals Discharge Condition: Stable Temperature (F): 97.8 Ambulatory Status: Cane Pulse (bpm): 80 Discharge Destination: Home Respiratory Rate (breaths/min): 18 Transportation: Private Auto Blood Pressure (mmHg): 135/83 Accompanied By: self Schedule Follow-up Appointment: Yes Clinical Summary of Care: Patient Declined Electronic Signature(s) Signed: 11/05/2022 5:49:41 PM By: Zenaida Deed RN, BSN Entered By: Zenaida Deed on 11/04/2022 13:28:49 Lattie Corns ([id number]) [id number] Page 2 of 7 -------------------------------------------------------------------------------- Lower Extremity Assessment Details Patient Name: Date of Service: HO [place], [name] 11/04/2022 3:15 PM Medical Record Number: [id number] Patient Account Number: [id number] Date of Birth/Sex: Treating RN: May 24, 1945 (77 y.o. Tommye Standard Primary Care Abrish Erny: [place], [place] Other Clinician: Referring Nimra Puccinelli: Treating Chrystal Zeimet/Extender: Ramon Dredge in Treatment: 24 Edema Assessment Assessed: [Left: No] [Right: No] Edema: [Left: Ye] [Right: s] Calf Left: Right: Point of Measurement: From Medial Instep 45.1 cm Ankle Left: Right: Point of Measurement: From Medial Instep 24.3 cm Vascular Assessment Pulses: Dorsalis Pedis Palpable: [Left:Yes] Extremity colors, hair growth, and conditions: Extremity Color: [Left:Normal] Hair Growth on Extremity: [Left:Yes] Temperature of Extremity: [Left:Warm] Capillary Refill: [Left:< 3 seconds] Dependent Rubor: [Left:No No] Electronic Signature(s) Signed: 11/05/2022 5:49:41 PM By: Zenaida Deed RN, BSN Entered By: Zenaida Deed on 11/04/2022 13:06:40 -------------------------------------------------------------------------------- Multi Wound Chart Details Patient Name: Date of Service: [name], [name]  11/04/2022 3:15 PM Medical Record Number: [id number] Patient Account Number: [id number] Date of Birth/Sex: Treating RN: [birth year] (77 y.o. F) Primary Care Sparkles Mcneely: [place], [place] Other Clinician: Referring Tamber Burtch: Treating Daryl Quiros/Extender: Ramon Dredge in Treatment: 24 Vital Signs Height(in): 63 Pulse(bpm): 80 Weight(lbs): 210 Blood Pressure(mmHg): 135/83 Body Mass Index(BMI): 37.2 Temperature(F): 97.8 Respiratory Rate(breaths/min): 18 [1:Photos:] [N/A:N/A] Left, Dorsal Foot N/A N/A Wound Location: Trauma N/A N/A Wounding Event: Lymphedema N/A N/A Primary Etiology: Asthma, Hypertension, Peripheral N/A N/A Comorbid History: Venous Disease, Osteoarthritis 12/24/2021 N/A N/A Date Acquired: 24 N/A N/A Weeks of Treatment: Open N/A  N/A Wound Status: No N/A N/A Wound Recurrence: 0.4x0.2x0.2 N/A N/A Measurements L x W x D (cm) 0.063 N/A N/A A (cm) : rea 0.013 N/A N/A Volume (cm) : 90.50% N/A N/A % Reduction in A rea: 90.20% N/A N/A % Reduction in Volume: Full Thickness Without Exposed N/A N/A Classification: Support Structures Medium N/A N/A Exudate A mount: Serosanguineous N/A N/A Exudate Type: red, brown N/A N/A Exudate Color: Distinct, outline attached N/A N/A Wound Margin: Large (67-100%) N/A N/A Granulation A mount: Red N/A N/A Granulation Quality: Small (1-33%) N/A N/A Necrotic A mount: Fat Layer (Subcutaneous Tissue): Yes N/A N/A Exposed Structures: Fascia: No Tendon: No Muscle: No Joint: No Bone: No Small (1-33%) N/A N/A Epithelialization: Debridement - Excisional N/A N/A Debridement: Pre-procedure Verification/Time Out 16:15 N/A N/A Taken: Lidocaine 4% Topical Solution N/A N/A Pain Control: Subcutaneous, Slough N/A N/A Tissue Debrided: Skin/Subcutaneous Tissue N/A N/A Level: 0.06 N/A N/A Debridement A (sq cm): rea Curette N/A N/A Instrument: Minimum N/A N/A Bleeding: Pressure N/A N/A Hemostasis A  chieved: 0 N/A N/A Procedural Pain: 0 N/A N/A Post Procedural Pain: Procedure was tolerated well N/A N/A Debridement Treatment Response: 0.4x0.2x0.2 N/A N/A Post Debridement Measurements L x W x D (cm) 0.013 N/A N/A Post Debridement Volume: (cm) Scarring: Yes N/A N/A Periwound Skin Texture: Dry/Scaly: No N/A N/A Periwound Skin Moisture: No Abnormalities Noted N/A N/A Periwound Skin Color: No Abnormality N/A N/A Temperature: Debridement N/A N/A Procedures Performed: Treatment Notes Wound #1 (Foot) Wound Laterality: Dorsal, Left Cleanser Soap and Water Discharge Instruction: May shower and wash wound with dial antibacterial soap and water prior to dressing change. Wound Cleanser Discharge Instruction: Cleanse the wound with wound cleanser prior to applying a clean dressing using gauze sponges, not tissue or cotton balls. Peri-Wound Care Sween Lotion (Moisturizing lotion) Discharge Instruction: Apply moisturizing lotion as directed Topical Gentamicin Discharge Instruction: As directed by physician Mupirocin Ointment Discharge Instruction: Apply Mupirocin (Bactroban) as instructed Primary Dressing DONA, WALBY ([id number]) [id number] Page 4 of 7 Promogran Prisma Matrix, 4.34 (sq in) (silver collagen) Discharge Instruction: Moisten collagen with saline or hydrogel Secondary Dressing Zetuvit Plus Silicone Border Dressing 4x4 (in/in) Discharge Instruction: Apply silicone border over primary dressing as directed. Secured With Compression Wrap Compression Stockings Facilities manager) Signed: 11/04/2022 4:58:57 PM By: Duanne Guess MD FACS Entered By: Duanne Guess on 11/04/2022 13:58:57 -------------------------------------------------------------------------------- Multi-Disciplinary Care Plan Details Patient Name: Date of Service: [name], [name] 11/04/2022 3:15 PM Medical Record Number: [id number] Patient  Account Number: [id number] Date of Birth/Sex: Treating RN: March 25, 1945 (77 y.o. Tommye Standard Primary Care Kaylanni Ezelle: [place], [place] Other Clinician: Referring Jaclynn Laumann: Treating Syrenity Klepacki/Extender: Ramon Dredge in Treatment: 24 Multidisciplinary Care Plan reviewed with physician Active Inactive Wound/Skin Impairment Nursing Diagnoses: Impaired tissue integrity Knowledge deficit related to ulceration/compromised skin integrity Goals: Patient/caregiver will verbalize understanding of skin care regimen Date Initiated: 05/20/2022 Target Resolution Date: 12/02/2022 Goal Status: Active Interventions: Assess ulceration(s) every visit Treatment Activities: Skin care regimen initiated : 05/20/2022 Topical wound management initiated : 05/20/2022 Notes: Electronic Signature(s) Signed: 11/05/2022 5:49:41 PM By: Zenaida Deed RN, BSN Entered By: Zenaida Deed on 11/04/2022 13:09:19 Pain Assessment Details -------------------------------------------------------------------------------- Lattie Corns ([id number]) [id number] Page 5 of 7 Patient Name: Date of Service: [place], [name] 11/04/2022 3:15 PM Medical Record Number: [id number] Patient Account Number: [id number] Date of Birth/Sex: Treating RN: [birth year] (77 y.o. F) Primary Care Oseph Imburgia: [place], [place] Other Clinician: Referring Jawana Reagor: Treating Vali Capano/Extender: Hermelinda Dellen,  Tressia Miners in Treatment: 24 Active Problems Location of Pain Severity and Description of Pain Patient Has Paino No Site Locations Rate the pain. Current Pain Level: 0 Pain Management and Medication Current Pain Management: Electronic Signature(s) Signed: 11/05/2022 5:49:41 PM By: Zenaida Deed RN, BSN Previous Signature: 11/04/2022 3:40:17 PM Version By: Karl Ito Entered By: Zenaida Deed on 11/04/2022  13:06:14 -------------------------------------------------------------------------------- Patient/Caregiver Education Details Patient Name: Date of Service: [name], [name] Salena Saner 10/30/2024andnbsp3:15 PM Medical Record Number: [id number] Patient Account Number: [id number] Date of Birth/Gender: Treating RN: 1945-06-19 (77 y.o. Tommye Standard Primary Care Physician: [place], [place] Other Clinician: Referring Physician: Treating Physician/Extender: Ramon Dredge in Treatment: 24 Education Assessment Education Provided To: Patient Education Topics Provided Wound/Skin Impairment: Methods: Explain/Verbal Responses: Reinforcements needed, State content correctly [employer]) Signed: 11/05/2022 5:49:41 PM By: Zenaida Deed RN, BSN Entered By: Zenaida Deed on 11/04/2022 13:09:45 Lattie Corns ([id number]) [id number] Page 6 of 7 -------------------------------------------------------------------------------- Wound Assessment Details Patient Name: Date of Service: HO [place], [name] 11/04/2022 3:15 PM Medical Record Number: [id number] Patient Account Number: [id number] Date of Birth/Sex: Treating RN: 06-07-1945 (77 y.o. Tommye Standard Primary Care Irving Bloor: [place], [place] Other Clinician: Referring Eeshan Verbrugge: Treating Nimai Burbach/Extender: Ramon Dredge in Treatment: 24 Wound Status Wound Number: 1 Primary Lymphedema Etiology: Wound Location: Left, Dorsal Foot Wound Status: Open Wounding Event: Trauma Comorbid Asthma, Hypertension, Peripheral Venous Disease, Date Acquired: 12/24/2021 History: Osteoarthritis Weeks Of Treatment: 24 Clustered Wound: No Photos Wound Measurements Length: (cm) 0.4 Width: (cm) 0.2 Depth: (cm) 0.2 Area: (cm) 0.063 Volume: (cm) 0.013 % Reduction in Area: 90.5% % Reduction in Volume: 90.2% Epithelialization: Small (1-33%) Tunneling: No Undermining:  No Wound Description Classification: Full Thickness Without Exposed Suppor Wound Margin: Distinct, outline attached Exudate Amount: Medium Exudate Type: Serosanguineous Exudate Color: red, brown t Structures Foul Odor After Cleansing: No Slough/Fibrino Yes Wound Bed Granulation Amount: Large (67-100%) Exposed Structure Granulation Quality: Red Fascia Exposed: No Necrotic Amount: Small (1-33%) Fat Layer (Subcutaneous Tissue) Exposed: Yes Necrotic Quality: Adherent Slough Tendon Exposed: No Muscle Exposed: No Joint Exposed: No Bone Exposed: No Periwound Skin Texture Texture Color No Abnormalities Noted: No No Abnormalities Noted: Yes Scarring: Yes Temperature / Pain Temperature: No Abnormality Moisture No Abnormalities Noted: Yes Treatment Notes Wound #1 (Foot) Wound Laterality: Dorsal, Left Cleanser Soap and Water Sabel, Laelia C ([id number]) [id number] Page 7 of 7 Discharge Instruction: May shower and wash wound with dial antibacterial soap and water prior to dressing change. Wound Cleanser Discharge Instruction: Cleanse the wound with wound cleanser prior to applying a clean dressing using gauze sponges, not tissue or cotton balls. Peri-Wound Care Sween Lotion (Moisturizing lotion) Discharge Instruction: Apply moisturizing lotion as directed Topical Gentamicin Discharge Instruction: As directed by physician Mupirocin Ointment Discharge Instruction: Apply Mupirocin (Bactroban) as instructed Primary Dressing Promogran Prisma Matrix, 4.34 (sq in) (silver collagen) Discharge Instruction: Moisten collagen with saline or hydrogel Secondary Dressing Zetuvit Plus Silicone Border Dressing 4x4 (in/in) Discharge Instruction: Apply silicone border over primary dressing as directed. Secured With Compression Wrap Compression Stockings Facilities manager) Signed: 11/05/2022 5:49:41 PM By: Zenaida Deed RN, BSN Previous Signature:  11/04/2022 3:40:17 PM Version By: Karl Ito Entered By: Zenaida Deed on 11/04/2022 13:08:30 -------------------------------------------------------------------------------- Vitals Details Patient Name: Date of Service: [name], [name] 11/04/2022 3:15 PM Medical Record Number: [id number] Patient Account Number: [id number] Date of Birth/Sex: Treating RN: [birth year] (77 y.o. F) Primary Care Shaquan Puerta: [place], [place] Other Clinician:  Referring Larae Caison: Treating Dolores Mcgovern/Extender: Ramon Dredge in Treatment: 24 Vital Signs Time Taken: 15:36 Temperature (F): 97.8 Height (in): 63 Pulse (bpm): 80 Weight (lbs): 210 Respiratory Rate (breaths/min): 18 Body Mass Index (BMI): 37.2 Blood Pressure (mmHg): 135/83 Reference Range: 80 - 120 mg / dl Electronic Signature(s) Signed: 11/05/2022 5:49:41 PM By: Zenaida Deed RN, BSN Previous Signature: 11/04/2022 3:40:17 PM Version By: Karl Ito Entered By: Zenaida Deed on 11/04/2022 13:05:49

## 2022-11-06 NOTE — Progress Notes (Signed)
[name], [name] ([id number]) [id number] Page 1 of 8 Visit Report for 11/04/2022 Chief Complaint Document Details Patient Name: Date of Service: [employer], [name] [name] [name] 11/04/2022 3:15 PM Medical Record Number: [id number] Patient Account Number: [id number] Date of Birth/Sex: Treating RN: [birth year] (77 y.o. F) Primary Care Provider: [place] Blas, [place] [facility] Other Clinician: Referring Provider: Treating Provider/Extender: Ramon Dredge in Treatment: 24 Information Obtained from: Patient Chief Complaint Patient seen for complaints of Non-Healing Wound. Electronic Signature(s) Signed: 11/04/2022 4:59:04 PM By: Duanne Guess MD FACS Entered By: Duanne Guess on 11/04/2022 13:59:04 -------------------------------------------------------------------------------- Debridement Details Patient Name: Date of Service: [name], [name]. 11/04/2022 3:15 PM Medical Record Number: [id number] Patient Account Number: [id number] Date of Birth/Sex: Treating RN: 03/28/1945 (77 y.o. Tommye Standard Primary Care Provider: [place] Blas, [place] [facility] Other Clinician: Referring Provider: Treating Provider/Extender: Ramon Dredge in Treatment: 24 Debridement Performed for Assessment: Wound #1 Left,Dorsal Foot Performed By: Physician Duanne Guess, MD The following information was scribed by: Zenaida Deed The information was scribed for: Duanne Guess Debridement Type: Debridement Level of Consciousness (Pre-procedure): Awake and Alert Pre-procedure Verification/Time Out Yes - 16:15 Taken: Start Time: 16:16 Pain Control: Lidocaine 4% T opical Solution Percent of Wound Bed Debrided: 100% T Area Debrided (cm): otal 0.06 Tissue and other material debrided: Viable, Non-Viable, Slough, Subcutaneous, Slough Level: Skin/Subcutaneous Tissue Debridement Description: Excisional Instrument: Curette Bleeding: Minimum Hemostasis Achieved:  Pressure Procedural Pain: 0 Post Procedural Pain: 0 Response to Treatment: Procedure was tolerated well Level of Consciousness (Post- Awake and Alert procedure): Post Debridement Measurements of Total Wound Length: (cm) 0.4 Width: (cm) 0.2 Depth: (cm) 0.2 Volume: (cm) 0.013 Character of Wound/Ulcer Post Debridement: Improved [name] [name] [name] ([id number]) [id number] Page 2 of 8 Post Procedure Diagnosis Same as Pre-procedure Electronic Signature(s) Signed: 11/04/2022 5:50:32 PM By: Duanne Guess MD FACS Signed: 11/05/2022 5:49:41 PM By: Zenaida Deed RN, BSN Entered By: Zenaida Deed on 11/04/2022 13:18:35 -------------------------------------------------------------------------------- HPI Details Patient Name: Date of Service: [name], [name]. 11/04/2022 3:15 PM Medical Record Number: [id number] Patient Account Number: [id number] Date of Birth/Sex: Treating RN: 12-Apr-1945 (77 y.o. F) Primary Care Provider: [place] Blas, [place] [facility] Other Clinician: Referring Provider: Treating Provider/Extender: Ramon Dredge in Treatment: 24 History of Present Illness HPI Description: ADMISSION 05/20/2022 This is a 77 year old woman with a chronic wound on her left foot. She has a remote history of trauma status post surgery and grafting many years ago. She reports that her dog stepped on her foot in December and the wound opened at that time. She was initially treated by her primary care doctor with mupirocin and a 10-day course of Bactrim for suspected cellulitis, back in February of this year. She is not diabetic and does not smoke. She was seen by Dr. Lajoyce Corners on March 26, 2022 and diagnosed with stasis dermatitis and chronic venous insufficiency. He prescribed compression stockings. His note makes no mention of the wound. She has not been wearing the compression stockings because she finds them difficult to apply. She reports wearing  compression leggings, but is not wearing them today and cannot tell me what degree of compression they provide. 05/29/2022: There is just 1 open area remaining on the dorsum of her foot. There is a little slough and eschar accumulation. She brought in her compression tights and although the degree of compression is not indicated, based on my own stretching and testing of the tensile strength, the best pair seems to be around  30 mmHg. She has some that are a little less snug and I told her that I prefer her to use the tighter ones. Her venous reflux studies are scheduled for next Friday at 3 PM. 06/10/2022: She did not make it to her venous reflux studies last week. The wound on the dorsum of her foot remains open with a little slough and eschar accumulation. She is not wearing her compression garment. 07/01/2022: Her venous reflux studies were done and were negative. The wound on the dorsum of her foot is smaller with slough and eschar buildup again. She is wearing her compression garments. 07/15/2022: The wound measures smaller again today but does persist. There is a little bit of slough accumulation. No concern for infection. 07/29/2022: The wound is a little bit smaller again today. There is slough accumulation, but no other debris. 08/10/2022: No change to the wound dimensions today. There is slough and eschar accumulation. 08/26/2022: The wound is down to a tiny pinhole underlying some eschar. 09/09/2022: Once again, a tiny pinhole opening remains with some eschar accumulation. 10/14/2022: I have not seen this patient in over a month. In that time, the wound on her dorsal foot has gotten a little bit larger. There is slough and eschar on the surface. She does wear compression garments on her legs, but these are leggings and do not include her feet; her feet have 1+ pitting edema, which I suspect is part of the reason we are not making progress. 11/04/2022: The wound on her dorsal foot is smaller today.  It is fairly clean with very minimal accumulation of slough or other debris. Electronic Signature(s) Signed: 11/04/2022 4:59:41 PM By: Duanne Guess MD FACS Entered By: Duanne Guess on 11/04/2022 13:59:41 -------------------------------------------------------------------------------- Physical Exam Details Patient Name: Date of Service: [name], [name]. 11/04/2022 3:15 PM [name] [name] [name] [name] ([id number]) [id number] Page 3 of 8 Medical Record Number: [id number] Patient Account Number: [id number] Date of Birth/Sex: Treating RN: 28-Feb-1945 (77 y.o. F) Primary Care Provider: [place] Blas, [place] [facility] Other Clinician: Referring Provider: Treating Provider/Extender: Ramon Dredge in Treatment: 24 Constitutional . . . . no acute distress. Respiratory Normal work of breathing on room air.. Notes 11/04/2022: The wound on her dorsal foot is smaller today. It is fairly clean with very minimal accumulation of slough or other debris. Electronic Signature(s) Signed: 11/04/2022 5:00:17 PM By: Duanne Guess MD FACS Entered By: Duanne Guess on 11/04/2022 14:00:17 -------------------------------------------------------------------------------- Physician Orders Details Patient Name: Date of Service: [name], [name]. 11/04/2022 3:15 PM Medical Record Number: [id number] Patient Account Number: [id number] Date of Birth/Sex: Treating RN: 21-Jun-1945 (77 y.o. Tommye Standard Primary Care Provider: [place] Blas, [place] [facility] Other Clinician: Referring Provider: Treating Provider/Extender: Ramon Dredge in Treatment: 24 The following information was scribed by: Zenaida Deed The information was scribed for: Duanne Guess Verbal / Phone Orders: No Diagnosis Coding ICD-10 Coding Code Description [phone] Non-pressure chronic ulcer of other part of left foot with fat layer exposed I87.2 Venous insufficiency (chronic)  (peripheral) I89.0 Lymphedema, not elsewhere classified Follow-up Appointments Return appointment in 3 weeks. - Dr Lady Gary Room 3 - Anesthetic (In clinic) Topical Lidocaine 5% applied to wound bed Bathing/ Shower/ Hygiene May shower and wash wound with soap and water. Edema Control - Orders / Instructions Elevate legs to the level of the heart or above for 30 minutes daily and/or when sitting for 3-4 times a day throughout the day. Avoid standing for long periods of time. Patient  to wear own compression stockings every day. Exercise regularly Wound Treatment Wound #1 - Foot Wound Laterality: Dorsal, Left Cleanser: Soap and Water 1 x Per Day/30 Days Discharge Instructions: May shower and wash wound with dial antibacterial soap and water prior to dressing change. Cleanser: Wound Cleanser (Generic) 1 x Per Day/30 Days Discharge Instructions: Cleanse the wound with wound cleanser prior to applying a clean dressing using gauze sponges, not tissue or cotton balls. Peri-Wound Care: Sween Lotion (Moisturizing lotion) (Generic) 1 x Per Day/30 Days Discharge Instructions: Apply moisturizing lotion as directed Topical: Gentamicin 1 x Per Day/30 Days ELDA, DUNKERSON ([id number]) [id number] Page 4 of 8 Discharge Instructions: As directed by physician Topical: Mupirocin Ointment 1 x Per Day/30 Days Discharge Instructions: Apply Mupirocin (Bactroban) as instructed Prim Dressing: Promogran Prisma Matrix, 4.34 (sq in) (silver collagen) 1 x Per Day/30 Days ary Discharge Instructions: Moisten collagen with saline or hydrogel Secondary Dressing: Zetuvit Plus Silicone Border Dressing 4x4 (in/in) (Generic) 1 x Per Day/30 Days Discharge Instructions: Apply silicone border over primary dressing as directed. Electronic Signature(s) Signed: 11/04/2022 5:50:32 PM By: Duanne Guess MD FACS Entered By: Duanne Guess on 11/04/2022  14:02:50 -------------------------------------------------------------------------------- Problem List Details Patient Name: Date of Service: [name], [name]. 11/04/2022 3:15 PM Medical Record Number: [id number] Patient Account Number: [id number] Date of Birth/Sex: Treating RN: 1945-03-18 (77 y.o. Tommye Standard Primary Care Provider: [place] Blas, [place] [facility] Other Clinician: Referring Provider: Treating Provider/Extender: Ramon Dredge in Treatment: 24 Active Problems ICD-10 Encounter Code Description Active Date MDM Diagnosis L97.522 Non-pressure chronic ulcer of other part of left foot with fat layer exposed 05/20/2022 No Yes I87.2 Venous insufficiency (chronic) (peripheral) 05/20/2022 No Yes I89.0 Lymphedema, not elsewhere classified 05/20/2022 No Yes Inactive Problems Resolved Problems Electronic Signature(s) Signed: 11/04/2022 4:58:49 PM By: Duanne Guess MD FACS Entered By: Duanne Guess on 11/04/2022 13:58:49 -------------------------------------------------------------------------------- Progress Note Details Patient Name: Date of Service: [name], [name]. 11/04/2022 3:15 PM Medical Record Number: [id number] Patient Account Number: [id number] Date of Birth/Sex: Treating RN: [birth year] (77 y.o. F) Primary Care Provider: [place] Blas, [facility] [facility] Other ClinicianJORA, [name] [name] ([id number]) [id number] Page 5 of 8 Referring Provider: Treating Provider/Extender: Ramon Dredge in Treatment: 24 Subjective Chief Complaint Information obtained from Patient Patient seen for complaints of Non-Healing Wound. History of Present Illness (HPI) ADMISSION 05/20/2022 This is a 77 year old woman with a chronic wound on her left foot. She has a remote history of trauma status post surgery and grafting many years ago. She reports that her dog stepped on her foot in December and the wound opened at that time. She  was initially treated by her primary care doctor with mupirocin and a 10-day course of Bactrim for suspected cellulitis, back in February of this year. She is not diabetic and does not smoke. She was seen by Dr. Lajoyce Corners on March 26, 2022 and diagnosed with stasis dermatitis and chronic venous insufficiency. He prescribed compression stockings. His note makes no mention of the wound. She has not been wearing the compression stockings because she finds them difficult to apply. She reports wearing compression leggings, but is not wearing them today and cannot tell me what degree of compression they provide. 05/29/2022: There is just 1 open area remaining on the dorsum of her foot. There is a little slough and eschar accumulation. She brought in her compression tights and although the degree of compression is not indicated, based on my own stretching and testing of  the tensile strength, the best pair seems to be around 30 mmHg. She has some that are a little less snug and I told her that I prefer her to use the tighter ones. Her venous reflux studies are scheduled for next Friday at 3 PM. 06/10/2022: She did not make it to her venous reflux studies last week. The wound on the dorsum of her foot remains open with a little slough and eschar accumulation. She is not wearing her compression garment. 07/01/2022: Her venous reflux studies were done and were negative. The wound on the dorsum of her foot is smaller with slough and eschar buildup again. She is wearing her compression garments. 07/15/2022: The wound measures smaller again today but does persist. There is a little bit of slough accumulation. No concern for infection. 07/29/2022: The wound is a little bit smaller again today. There is slough accumulation, but no other debris. 08/10/2022: No change to the wound dimensions today. There is slough and eschar accumulation. 08/26/2022: The wound is down to a tiny pinhole underlying some eschar. 09/09/2022: Once again,  a tiny pinhole opening remains with some eschar accumulation. 10/14/2022: I have not seen this patient in over a month. In that time, the wound on her dorsal foot has gotten a little bit larger. There is slough and eschar on the surface. She does wear compression garments on her legs, but these are leggings and do not include her feet; her feet have 1+ pitting edema, which I suspect is part of the reason we are not making progress. 11/04/2022: The wound on her dorsal foot is smaller today. It is fairly clean with very minimal accumulation of slough or other debris. Patient History Family History Cancer - Mother, Diabetes, Heart Disease, Hypertension, No family history of Hereditary Spherocytosis, Kidney Disease, Lung Disease, Seizures, Stroke, Thyroid Problems, Tuberculosis. Social History Never smoker, Marital Status - Divorced, Alcohol Use - Never, Drug Use - No History, Caffeine Use - Daily. Medical History Respiratory Patient has history of Asthma Cardiovascular Patient has history of Hypertension, Peripheral Venous Disease Musculoskeletal Patient has history of Osteoarthritis Hospitalization/Surgery History - total hip arthroplasty. - elbow arthroscopy with tendon reconstruction. - foot surgery. Objective Constitutional no acute distress. Vitals Time Taken: 3:36 PM, Height: 63 in, Weight: 210 lbs, BMI: 37.2, Temperature: 97.8 F, Pulse: 80 bpm, Respiratory Rate: 18 breaths/min, Blood Pressure: 135/83 mmHg. Respiratory Normal work of breathing on room air.Marland Kitchen [name] [name] [name] [name] ([id number]) [id number] Page 6 of 8 General Notes: 11/04/2022: The wound on her dorsal foot is smaller today. It is fairly clean with very minimal accumulation of slough or other debris. Integumentary (Hair, Skin) Wound #1 status is Open. Original cause of wound was Trauma. The date acquired was: 12/24/2021. The wound has been in treatment 24 weeks. The wound is located on the  Left,Dorsal Foot. The wound measures 0.4cm length x 0.2cm width x 0.2cm depth; 0.063cm^2 area and 0.013cm^3 volume. There is Fat Layer (Subcutaneous Tissue) exposed. There is no tunneling or undermining noted. There is a medium amount of serosanguineous drainage noted. The wound margin is distinct with the outline attached to the wound base. There is large (67-100%) red granulation within the wound bed. There is a small (1-33%) amount of necrotic tissue within the wound bed including Adherent Slough. The periwound skin appearance had no abnormalities noted for moisture. The periwound skin appearance had no abnormalities noted for color. The periwound skin appearance exhibited: Scarring. Periwound temperature was noted as No Abnormality. Assessment Active Problems ICD-10 Non-pressure chronic  ulcer of other part of left foot with fat layer exposed Venous insufficiency (chronic) (peripheral) Lymphedema, not elsewhere classified Procedures Wound #1 Pre-procedure diagnosis of Wound #1 is a Lymphedema located on the Left,Dorsal Foot . There was a Excisional Skin/Subcutaneous Tissue Debridement with a total area of 0.06 sq cm performed by Duanne Guess, MD. With the following instrument(s): Curette to remove Viable and Non-Viable tissue/material. Material removed includes Subcutaneous Tissue and Slough and after achieving pain control using Lidocaine 4% T opical Solution. No specimens were taken. A time out was conducted at 16:15, prior to the start of the procedure. A Minimum amount of bleeding was controlled with Pressure. The procedure was tolerated well with a pain level of 0 throughout and a pain level of 0 following the procedure. Post Debridement Measurements: 0.4cm length x 0.2cm width x 0.2cm depth; 0.013cm^3 volume. Character of Wound/Ulcer Post Debridement is improved. Post procedure Diagnosis Wound #1: Same as Pre-Procedure Plan Follow-up Appointments: Return appointment in 3 weeks. -  Dr Lady Gary Room 3 - Anesthetic: (In clinic) Topical Lidocaine 5% applied to wound bed Bathing/ Shower/ Hygiene: May shower and wash wound with soap and water. Edema Control - Orders / Instructions: Elevate legs to the level of the heart or above for 30 minutes daily and/or when sitting for 3-4 times a day throughout the day. Avoid standing for long periods of time. Patient to wear own compression stockings every day. Exercise regularly WOUND #1: - Foot Wound Laterality: Dorsal, Left Cleanser: Soap and Water 1 x Per Day/30 Days Discharge Instructions: May shower and wash wound with dial antibacterial soap and water prior to dressing change. Cleanser: Wound Cleanser (Generic) 1 x Per Day/30 Days Discharge Instructions: Cleanse the wound with wound cleanser prior to applying a clean dressing using gauze sponges, not tissue or cotton balls. Peri-Wound Care: Sween Lotion (Moisturizing lotion) (Generic) 1 x Per Day/30 Days Discharge Instructions: Apply moisturizing lotion as directed Topical: Gentamicin 1 x Per Day/30 Days Discharge Instructions: As directed by physician Topical: Mupirocin Ointment 1 x Per Day/30 Days Discharge Instructions: Apply Mupirocin (Bactroban) as instructed Prim Dressing: Promogran Prisma Matrix, 4.34 (sq in) (silver collagen) 1 x Per Day/30 Days ary Discharge Instructions: Moisten collagen with saline or hydrogel Secondary Dressing: Zetuvit Plus Silicone Border Dressing 4x4 (in/in) (Generic) 1 x Per Day/30 Days Discharge Instructions: Apply silicone border over primary dressing as directed. 11/04/2022: The wound on her dorsal foot is smaller today. It is fairly clean with very minimal accumulation of slough or other debris. I used a curette to debride slough and subcutaneous tissue from the wound. We will continue the mixture of topical gentamicin and mupirocin with Prisma silver collagen. She will continue to wear her compression garments. Per her preference, she will  follow-up in 3 weeks. Electronic Signature(s) Signed: 11/04/2022 5:03:53 PM By: Duanne Guess MD FACS Lattie Corns ([id number]) [id number] Page 7 of 8 Entered By: Duanne Guess on 11/04/2022 14:03:53 -------------------------------------------------------------------------------- HxROS Details Patient Name: Date of Service: [name], [name] [name] [name]. 11/04/2022 3:15 PM Medical Record Number: [id number] Patient Account Number: [id number] Date of Birth/Sex: Treating RN: January 31, 1945 (77 y.o. F) Primary Care Provider: [place] Blas, [place] [facility] Other Clinician: Referring Provider: Treating Provider/Extender: Ramon Dredge in Treatment: 24 Respiratory Medical History: Positive for: Asthma Cardiovascular Medical History: Positive for: Hypertension; Peripheral Venous Disease Musculoskeletal Medical History: Positive for: Osteoarthritis Immunizations Pneumococcal Vaccine: Received Pneumococcal Vaccination: No Implantable Devices None Hospitalization / Surgery History Type of Hospitalization/Surgery total hip arthroplasty elbow arthroscopy with  tendon reconstruction foot surgery Family and Social History Cancer: Yes - Mother; Diabetes: Yes; Heart Disease: Yes; Hereditary Spherocytosis: No; Hypertension: Yes; Kidney Disease: No; Lung Disease: No; Seizures: No; Stroke: No; Thyroid Problems: No; Tuberculosis: No; Never smoker; Marital Status - Divorced; Alcohol Use: Never; Drug Use: No History; Caffeine Use: Daily; Financial Concerns: No; Food, Clothing or Shelter Needs: No; Support System Lacking: No; Transportation Concerns: No Psychologist, prison and probation services) Signed: 11/04/2022 5:50:32 PM By: Duanne Guess MD FACS Entered By: Duanne Guess on 11/04/2022 13:59:57 -------------------------------------------------------------------------------- SuperBill Details Patient Name: Date of Service: [name], [name]. 11/04/2022 Medical Record  Number: [id number] Patient Account Number: [id number] Date of Birth/Sex: Treating RN: [birth year] (77 y.o. F) Primary Care Provider: [place] Blas, [place] [facility] Other Clinician: Referring Provider: Treating Provider/Extender: Ramon Dredge in Treatment: [address] [name] [name] ([id number]) [id number] Page 8 of 8 Diagnosis Coding ICD-10 Codes Code Description [phone] Non-pressure chronic ulcer of other part of left foot with fat layer exposed I87.2 Venous insufficiency (chronic) (peripheral) I89.0 Lymphedema, not elsewhere classified Facility Procedures : 3 CPT4 Code: [id number] Description: 11042 - DEB SUBQ TISSUE 20 SQ CM/< ICD-10 Diagnosis Description L97.522 Non-pressure chronic ulcer of other part of left foot with fat layer exposed Modifier: Quantity: 1 Physician Procedures : CPT4 Code Description Modifier [id number] - WC PHYS LEVEL 4 - EST PT ICD-10 Diagnosis Description L97.522 Non-pressure chronic ulcer of other part of left foot with fat layer exposed I87.2 Venous insufficiency (chronic) (peripheral) I89.0  Lymphedema, not elsewhere classified Quantity: 1 : [id number] 11042 - WC PHYS SUBQ TISS 20 SQ CM ICD-10 Diagnosis Description L97.522 Non-pressure chronic ulcer of other part of left foot with fat layer exposed Quantity: 1 Electronic Signature(s) Signed: 11/04/2022 5:04:13 PM By: Duanne Guess MD FACS Entered By: Duanne Guess on 11/04/2022 14:04:12

## 2022-11-24 ENCOUNTER — Encounter (HOSPITAL_BASED_OUTPATIENT_CLINIC_OR_DEPARTMENT_OTHER): Payer: Medicare Other | Admitting: General Surgery

## 2022-12-14 ENCOUNTER — Other Ambulatory Visit: Payer: Self-pay | Admitting: Family Medicine

## 2022-12-14 DIAGNOSIS — Z1231 Encounter for screening mammogram for malignant neoplasm of breast: Secondary | ICD-10-CM

## 2022-12-21 ENCOUNTER — Ambulatory Visit (HOSPITAL_BASED_OUTPATIENT_CLINIC_OR_DEPARTMENT_OTHER): Payer: Medicare Other | Admitting: General Surgery

## 2023-01-12 ENCOUNTER — Ambulatory Visit (HOSPITAL_BASED_OUTPATIENT_CLINIC_OR_DEPARTMENT_OTHER): Payer: Medicare Other | Admitting: General Surgery

## 2023-01-12 ENCOUNTER — Ambulatory Visit (INDEPENDENT_AMBULATORY_CARE_PROVIDER_SITE_OTHER): Payer: Medicare Other | Admitting: Orthopedic Surgery

## 2023-01-12 DIAGNOSIS — M25571 Pain in right ankle and joints of right foot: Secondary | ICD-10-CM

## 2023-01-12 DIAGNOSIS — B351 Tinea unguium: Secondary | ICD-10-CM | POA: Diagnosis not present

## 2023-01-13 ENCOUNTER — Encounter (HOSPITAL_BASED_OUTPATIENT_CLINIC_OR_DEPARTMENT_OTHER): Payer: Medicare Other | Attending: General Surgery | Admitting: General Surgery

## 2023-01-13 DIAGNOSIS — L97522 Non-pressure chronic ulcer of other part of left foot with fat layer exposed: Secondary | ICD-10-CM | POA: Insufficient documentation

## 2023-01-13 DIAGNOSIS — I890 Lymphedema, not elsewhere classified: Secondary | ICD-10-CM | POA: Insufficient documentation

## 2023-01-13 DIAGNOSIS — I872 Venous insufficiency (chronic) (peripheral): Secondary | ICD-10-CM | POA: Diagnosis not present

## 2023-01-13 NOTE — Progress Notes (Signed)
 [name] ([id number]) [id number] Page 1 of 5 Visit Report for 01/13/2023 Chief Complaint Document Details Patient Name: Date of Service: [place], [name] 01/13/2023 2:45 PM Medical Record Number: [id number] Patient Account Number: [id number] Date of Birth/Sex: Treating RN: [birth year] (78 y.o. F) Primary Care Provider: TOMMYE MOULDING, [place] [facility] Other Clinician: Referring Provider: Treating Provider/Extender: Marolyn Delon Anastasio Rockey Devra in Treatment: 34 Information Obtained from: Patient Chief Complaint Patient seen for complaints of Non-Healing Wound. Electronic Signature(s) Signed: 01/13/2023 3:22:49 PM By: Marolyn Delon MD FACS Entered By: Marolyn Delon on 01/13/2023 15:22:49 -------------------------------------------------------------------------------- HPI Details Patient Name: Date of Service: [name]. 01/13/2023 2:45 PM Medical Record Number: [id number] Patient Account Number: [id number] Date of Birth/Sex: Treating RN: Oct 01, 1945 (78 y.o. F) Primary Care Provider: TOMMYE MOULDING, [place] Other Clinician: Referring Provider: Treating Provider/Extender: Marolyn Delon Anastasio Rockey Devra in Treatment: 34 History of Present Illness HPI Description: ADMISSION 05/20/2022 This is a 78 year old woman with a chronic wound on her left foot. She has a remote history of trauma status post surgery and grafting many years ago. She reports that her dog stepped on her foot in December and the wound opened at that time. She was initially treated by her primary care doctor with mupirocin and a 10-day course of Bactrim for suspected cellulitis, back in February of this year. She is not diabetic and does not smoke. She was seen by Dr. Harden on March 26, 2022 and diagnosed with stasis dermatitis and chronic venous insufficiency. He prescribed compression stockings. His note makes no mention of the wound. She has not been wearing the compression stockings  because she finds them difficult to apply. She reports wearing compression leggings, but is not wearing them today and cannot tell me what degree of compression they provide. 05/29/2022: There is just 1 open area remaining on the dorsum of her foot. There is a little slough and eschar accumulation. She brought in her compression tights and although the degree of compression is not indicated, based on my own stretching and testing of the tensile strength, the best pair seems to be around 30 mmHg. She has some that are a little less snug and I told her that I prefer her to use the tighter ones. Her venous reflux studies are scheduled for next Friday at 3 PM. 06/10/2022: She did not make it to her venous reflux studies last week. The wound on the dorsum of her foot remains open with a little slough and eschar accumulation. She is not wearing her compression garment. 07/01/2022: Her venous reflux studies were done and were negative. The wound on the dorsum of her foot is smaller with slough and eschar buildup again. She is wearing her compression garments. 07/15/2022: The wound measures smaller again today but does persist. There is a little bit of slough accumulation. No concern for infection. 07/29/2022: The wound is a little bit smaller again today. There is slough accumulation, but no other debris. 08/10/2022: No change to the wound dimensions today. There is slough and eschar accumulation. 08/26/2022: The wound is down to a tiny pinhole underlying some eschar. 09/09/2022: Once again, a tiny pinhole opening remains with some eschar accumulation. 10/14/2022: I have not seen this patient in over a month. In that time, the wound on her dorsal foot has gotten a little bit larger. There is slough and eschar on [name] ([id number]) [id number].pdf Page 2 of 5 the surface. She does wear compression garments on her legs, but these  are leggings and do not include her feet; her feet have  1+ pitting edema, which I suspect is part of the reason we are not making progress. 11/04/2022: The wound on her dorsal foot is smaller today. It is fairly clean with very minimal accumulation of slough or other debris. 01/13/2023: The wound is healed. She did find a foot and ankle sleeve from her preferred manufacturer ([employer]) and has been wearing this. Electronic Signature(s) Signed: 01/13/2023 3:23:43 PM By: Marolyn Nest MD FACS Entered By: Marolyn Nest on 01/13/2023 15:23:42 -------------------------------------------------------------------------------- Physical Exam Details Patient Name: Date of Service: [name]. 01/13/2023 2:45 PM Medical Record Number: [id number] Patient Account Number: [id number] Date of Birth/Sex: Treating RN: September 23, 1945 (78 y.o. F) Primary Care Provider: TOMMYE MOULDING, [place] Other Clinician: Referring Provider: Treating Provider/Extender: Marolyn Nest Anastasio Rockey Devra in Treatment: 34 Constitutional . . . . no acute distress. Respiratory Normal work of breathing on room air.. Notes 01/13/2023: Her wound is healed. Electronic Signature(s) Signed: 01/13/2023 3:24:12 PM By: Marolyn Nest MD FACS Entered By: Marolyn Nest on 01/13/2023 15:24:12 -------------------------------------------------------------------------------- Physician Orders Details Patient Name: Date of Service: [name]. 01/13/2023 2:45 PM Medical Record Number: [id number] Patient Account Number: [id number] Date of Birth/Sex: Treating RN: July 15, 1945 (78 y.o. JEANELL Merleen Handing Primary Care Provider: TOMMYE MOULDING, [place] Other Clinician: Referring Provider: Treating Provider/Extender: Marolyn Nest Anastasio Rockey Devra in Treatment: 34 The following information was scribed by: Merleen Handing The information was scribed for: Marolyn Nest Verbal / Phone Orders: No Diagnosis Coding ICD-10 Coding Code Description [phone] Non-pressure chronic ulcer of  other part of left foot with fat layer exposed I87.2 Venous insufficiency (chronic) (peripheral) I89.0 Lymphedema, not elsewhere classified Bathing/ Shower/ Hygiene May shower and wash wound with soap and water. Edema Control - Orders / Instructions Bierly, Norene [name] ([id number]) [id number] Page 3 of 5 Elevate legs to the level of the heart or above for 30 minutes daily and/or when sitting for 3-4 times a day throughout the day. Avoid standing for long periods of time. Patient to wear own compression stockings every day. Exercise regularly Moisturize legs daily. Electronic Signature(s) Signed: 01/13/2023 3:26:07 PM By: Marolyn Nest MD FACS Entered By: Marolyn Nest on 01/13/2023 15:26:06 -------------------------------------------------------------------------------- Problem List Details Patient Name: Date of Service: [name]. 01/13/2023 2:45 PM Medical Record Number: [id number] Patient Account Number: [id number] Date of Birth/Sex: Treating RN: 19-Jul-1945 (78 y.o. JEANELL Merleen Handing Primary Care Provider: TOMMYE MOULDING, [place] Other Clinician: Referring Provider: Treating Provider/Extender: Marolyn Nest Anastasio Rockey Devra in Treatment: 34 Active Problems ICD-10 Encounter Code Description Active Date MDM Diagnosis L97.522 Non-pressure chronic ulcer of other part of left foot with fat layer exposed 05/20/2022 No Yes I87.2 Venous insufficiency (chronic) (peripheral) 05/20/2022 No Yes I89.0 Lymphedema, not elsewhere classified 05/20/2022 No Yes Inactive Problems Resolved Problems Electronic Signature(s) Signed: 01/13/2023 3:22:36 PM By: Marolyn Nest MD FACS Entered By: Marolyn Nest on 01/13/2023 15:22:36 -------------------------------------------------------------------------------- Progress Note Details Patient Name: Date of Service: [name]. 01/13/2023 2:45 PM Medical Record Number: [id number] Patient Account Number:  [id number] Date of Birth/Sex: Treating RN: [birth year] (78 y.o. F) Primary Care Provider: TOMMYE MOULDING, [place] Other Clinician: Referring Provider: Treating Provider/Extender: Marolyn Nest Anastasio Rockey Devra in Treatment: 34 Subjective Chief Complaint Information obtained from Patient PORSHA, SKILTON ([id number]) [id number] Page 4 of 5 Patient seen for complaints of Non-Healing Wound. History of Present Illness (HPI) ADMISSION 05/20/2022 This is a 78 year old woman with  a chronic wound on her left foot. She has a remote history of trauma status post surgery and grafting many years ago. She reports that her dog stepped on her foot in December and the wound opened at that time. She was initially treated by her primary care doctor with mupirocin and a 10-day course of Bactrim for suspected cellulitis, back in February of this year. She is not diabetic and does not smoke. She was seen by Dr. Harden on March 26, 2022 and diagnosed with stasis dermatitis and chronic venous insufficiency. He prescribed compression stockings. His note makes no mention of the wound. She has not been wearing the compression stockings because she finds them difficult to apply. She reports wearing compression leggings, but is not wearing them today and cannot tell me what degree of compression they provide. 05/29/2022: There is just 1 open area remaining on the dorsum of her foot. There is a little slough and eschar accumulation. She brought in her compression tights and although the degree of compression is not indicated, based on my own stretching and testing of the tensile strength, the best pair seems to be around 30 mmHg. She has some that are a little less snug and I told her that I prefer her to use the tighter ones. Her venous reflux studies are scheduled for next Friday at 3 PM. 06/10/2022: She did not make it to her venous reflux studies last week. The wound on the dorsum of her foot  remains open with a little slough and eschar accumulation. She is not wearing her compression garment. 07/01/2022: Her venous reflux studies were done and were negative. The wound on the dorsum of her foot is smaller with slough and eschar buildup again. She is wearing her compression garments. 07/15/2022: The wound measures smaller again today but does persist. There is a little bit of slough accumulation. No concern for infection. 07/29/2022: The wound is a little bit smaller again today. There is slough accumulation, but no other debris. 08/10/2022: No change to the wound dimensions today. There is slough and eschar accumulation. 08/26/2022: The wound is down to a tiny pinhole underlying some eschar. 09/09/2022: Once again, a tiny pinhole opening remains with some eschar accumulation. 10/14/2022: I have not seen this patient in over a month. In that time, the wound on her dorsal foot has gotten a little bit larger. There is slough and eschar on the surface. She does wear compression garments on her legs, but these are leggings and do not include her feet; her feet have 1+ pitting edema, which I suspect is part of the reason we are not making progress. 11/04/2022: The wound on her dorsal foot is smaller today. It is fairly clean with very minimal accumulation of slough or other debris. 01/13/2023: The wound is healed. She did find a foot and ankle sleeve from her preferred manufacturer ([employer]) and has been wearing this. Objective Constitutional no acute distress. Vitals Time Taken: 2:55 PM, Height: 63 in, Weight: 210 lbs, BMI: 37.2, Temperature: 97.5 F, Pulse: 79 bpm, Respiratory Rate: 18 breaths/min, Blood Pressure: 129/75 mmHg. Respiratory Normal work of breathing on room air.. General Notes: 01/13/2023: Her wound is healed. Integumentary (Hair, Skin) Wound #1 status is Open. Original cause of wound was Trauma. The date acquired was: 12/24/2021. The wound has been in treatment 34 weeks. The  wound is located on the Left,Dorsal Foot. The wound measures 0cm length x 0cm width x 0cm depth; 0cm^2 area and 0cm^3 volume. There is no  tunneling or undermining noted. There is a none present amount of drainage noted. There is no granulation within the wound bed. There is no necrotic tissue within the wound bed. The periwound skin appearance had no abnormalities noted for moisture. The periwound skin appearance had no abnormalities noted for color. The periwound skin appearance exhibited: Scarring. Periwound temperature was noted as No Abnormality. Assessment Active Problems ICD-10 Non-pressure chronic ulcer of other part of left foot with fat layer exposed Venous insufficiency (chronic) (peripheral) Lymphedema, not elsewhere classified Alaniz, Elizebath [name] ([id number]) [phone].pdf Page 5 of 5 Plan Bathing/ Shower/ Hygiene: May shower and wash wound with soap and water. Edema Control - Orders / Instructions: Elevate legs to the level of the heart or above for 30 minutes daily and/or when sitting for 3-4 times a day throughout the day. Avoid standing for long periods of time. Patient to wear own compression stockings every day. Exercise regularly Moisturize legs daily. 01/13/2023: The wound is healed. She did find a foot and ankle sleeve from her preferred manufacturer ([employer]) and has been wearing this. I think the compression on her foot and ankle the difference in getting this wound to finally close. She should continue to wear her compression garments on a daily basis. We will discharge her from the wound care center. She may follow-up in the future, should the need arise. Electronic Signature(s) Signed: 01/13/2023 3:27:36 PM By: Marolyn Nest MD FACS Entered By: Marolyn Nest on 01/13/2023 15:27:36 -------------------------------------------------------------------------------- SuperBill Details Patient Name: Date of Service: [name].  01/13/2023 Medical Record Number: [id number] Patient Account Number: [id number] Date of Birth/Sex: Treating RN: Jul 12, 1945 (78 y.o. JEANELL Merleen Handing Primary Care Provider: TOMMYE MOULDING, [place] Other Clinician: Referring Provider: Treating Provider/Extender: Marolyn Nest Anastasio Rockey Devra in Treatment: 34 Diagnosis Coding ICD-10 Codes Code Description [phone] Non-pressure chronic ulcer of other part of left foot with fat layer exposed I87.2 Venous insufficiency (chronic) (peripheral) I89.0 Lymphedema, not elsewhere classified Facility Procedures : 7 CPT4 Code: [id number] Description: [phone] - WOUND CARE VISIT-LEV 2 EST PT Modifier: Quantity: 1 Physician Procedures : CPT4 Code Description Modifier [id number] - WC PHYS LEVEL 2 - EST PT ICD-10 Diagnosis Description L97.522 Non-pressure chronic ulcer of other part of left foot with fat layer exposed I87.2 Venous insufficiency (chronic) (peripheral) I89.0  Lymphedema, not elsewhere classified Quantity: 1 Electronic Signature(s) Signed: 01/13/2023 3:27:48 PM By: Marolyn Nest MD FACS Entered By: Marolyn Nest on 01/13/2023 15:27:48

## 2023-01-14 NOTE — Progress Notes (Signed)
 Avino, Madelyn C ([id number]) [id number] Page 1 of 8 Visit Report for 01/13/2023 Arrival Information Details Patient Name: Date of Service: [name], [name] 01/13/2023 2:45 PM Medical Record Number: [id number] Patient Account Number: [id number] Date of Birth/Sex: Treating RN: 15-Jul-1945 (78 y.o. [name] Primary Care Honor Fairbank: TOMMYE MOULDING, [place] Other Clinician: Referring Raidon Swanner: Treating Joseluis Alessio/Extender: Marolyn Delon Anastasio Rockey Devra in Treatment: 34 Visit Information History Since Last Visit Added or deleted any medications: No Patient Arrived: Ambulatory Any new allergies or adverse reactions: No Arrival Time: 14:51 Had a fall or experienced change in No Accompanied By: self activities of daily living that may affect Transfer Assistance: None risk of falls: Patient Identification Verified: Yes Signs or symptoms of abuse/neglect since last visito No Secondary Verification Process Completed: Yes Hospitalized since last visit: No Patient Requires Transmission-Based Precautions: No Implantable device outside of the clinic excluding No Patient Has Alerts: No cellular tissue based products placed in the center since last visit: Has Dressing in Place as Prescribed: No Pain Present Now: No Electronic Signature(s) Signed: 01/13/2023 5:52:54 PM By: Merleen Handing RN, BSN Entered By: Boehlein, Linda on 01/13/2023 14:52:31 -------------------------------------------------------------------------------- Clinic Level of Care Assessment Details Patient Name: Date of Service: Krista DGES, [name] 01/13/2023 2:45 PM Medical Record Number: [id number] Patient Account Number: [id number] Date of Birth/Sex: Treating RN: 1945-04-23 (78 y.o. [name] Primary Care Samariyah Cowles: TOMMYE MOULDING, [place] Other Clinician: Referring Bharath Bernstein: Treating Jihan Rudy/Extender: Marolyn Delon Anastasio Rockey Devra in Treatment: 34 Clinic Level of Care Assessment  Items TOOL 4 Quantity Score []  - 0 Use when only an EandM is performed on FOLLOW-UP visit ASSESSMENTS - Nursing Assessment / Reassessment X- 1 10 Reassessment of Co-morbidities (includes updates in patient status) X- 1 5 Reassessment of Adherence to Treatment Plan ASSESSMENTS - Wound and Skin A ssessment / Reassessment X - Simple Wound Assessment / Reassessment - one wound 1 5 []  - 0 Complex Wound Assessment / Reassessment - multiple wounds []  - 0 Dermatologic / Skin Assessment (not related to wound area) ASSESSMENTS - Focused Assessment []  - 0 Circumferential Edema Measurements - multi extremities []  - 0 Nutritional Assessment / Counseling / Intervention [name] ([id number]) [id number] Page 2 of 8 X- 1 5 Lower Extremity Assessment (monofilament, tuning fork, pulses) []  - 0 Peripheral Arterial Disease Assessment (using hand held doppler) ASSESSMENTS - Ostomy and/or Continence Assessment and Care []  - 0 Incontinence Assessment and Management []  - 0 Ostomy Care Assessment and Management (repouching, etc.) PROCESS - Coordination of Care X - Simple Patient / Family Education for ongoing care 1 15 []  - 0 Complex (extensive) Patient / Family Education for ongoing care X- 1 10 Staff obtains Chiropractor, Records, T Results / Process Orders est []  - 0 Staff telephones HHA, Nursing Homes / Clarify orders / etc []  - 0 Routine Transfer to another Facility (non-emergent condition) []  - 0 Routine Hospital Admission (non-emergent condition) []  - 0 New Admissions / Manufacturing Engineer / Ordering NPWT Apligraf, etc. , []  - 0 Emergency Hospital Admission (emergent condition) X- 1 10 Simple Discharge Coordination []  - 0 Complex (extensive) Discharge Coordination PROCESS - Special Needs []  - 0 Pediatric / Minor Patient Management []  - 0 Isolation Patient Management []  - 0 Hearing / Language / Visual special needs []  - 0 Assessment of  Community assistance (transportation, D/C planning, etc.) []  - 0 Additional assistance / Altered mentation []  - 0 Support Surface(s) Assessment (bed, cushion, seat, etc.) INTERVENTIONS - Wound Cleansing /  Measurement []  - 0 Simple Wound Cleansing - one wound []  - 0 Complex Wound Cleansing - multiple wounds X- 1 5 Wound Imaging (photographs - any number of wounds) []  - 0 Wound Tracing (instead of photographs) []  - 0 Simple Wound Measurement - one wound []  - 0 Complex Wound Measurement - multiple wounds INTERVENTIONS - Wound Dressings []  - 0 Small Wound Dressing one or multiple wounds []  - 0 Medium Wound Dressing one or multiple wounds []  - 0 Large Wound Dressing one or multiple wounds []  - 0 Application of Medications - topical []  - 0 Application of Medications - injection INTERVENTIONS - Miscellaneous []  - 0 External ear exam []  - 0 Specimen Collection (cultures, biopsies, blood, body fluids, etc.) []  - 0 Specimen(s) / Culture(s) sent or taken to Lab for analysis []  - 0 Patient Transfer (multiple staff / Nurse, Adult / Similar devices) []  - 0 Simple Staple / Suture removal (25 or less) []  - 0 Complex Staple / Suture removal (26 or more) []  - 0 Hypo / Hyperglycemic Management (close monitor of Blood Glucose) Jobson, Symphany C ([id number]) [id number] Page 3 of 8 []  - 0 Ankle / Brachial Index (ABI) - do not check if billed separately X- 1 5 Vital Signs Has the patient been seen at the hospital within the last three years: Yes Total Score: 70 Level Of Care: New/Established - Level 2 Electronic Signature(s) Signed: 01/13/2023 5:52:54 PM By: Merleen Handing RN, BSN Entered By: Boehlein, Linda on 01/13/2023 15:07:36 -------------------------------------------------------------------------------- Encounter Discharge Information Details Patient Name: Date of Service: Krista DGES, [name] 01/13/2023 2:45 PM Medical Record Number:  [id number] Patient Account Number: [id number] Date of Birth/Sex: Treating RN: [birth year] (78 y.o. [name] Primary Care [name]: TOMMYE MOULDING, [place] Other Clinician: Referring Beata Beason: Treating Amyah Clawson/Extender: Marolyn Delon Anastasio Rockey Devra in Treatment: 34 Encounter Discharge Information Items Discharge Condition: Stable Ambulatory Status: Ambulatory Discharge Destination: Home Transportation: Private Auto Accompanied By: self Schedule Follow-up Appointment: Yes Clinical Summary of Care: Patient Declined Electronic Signature(s) Signed: 01/13/2023 5:52:54 PM By: Merleen Handing RN, BSN Entered By: Boehlein, Linda on 01/13/2023 15:08:45 -------------------------------------------------------------------------------- Lower Extremity Assessment Details Patient Name: Date of Service: Krista DGES, [name] 01/13/2023 2:45 PM Medical Record Number: [id number] Patient Account Number: [id number] Date of Birth/Sex: Treating RN: 1946-01-04 (78 y.o. [name] Primary Care Marshay Slates: TOMMYE MOULDING, [place] Other Clinician: Referring Florance Paolillo: Treating Sergio Hobart/Extender: Marolyn Delon Anastasio Rockey Devra in Treatment: 34 Edema Assessment Assessed: [Left: No] [Right: No] Edema: [Left: Ye] [Right: s] Calf Left: Right: Point of Measurement: From Medial Instep 45.1 cm Ankle Left: Right: Point of Measurement: From Medial Instep 24.3 cm Vascular Assessment Hollister, Hula C ([id number]) [Right:[id number] Page 4 of 8] Pulses: Dorsalis Pedis Palpable: [Left:Yes] Extremity colors, hair growth, and conditions: Extremity Color: [Left:Normal] Hair Growth on Extremity: [Left:Yes] Temperature of Extremity: [Left:Warm] Capillary Refill: [Left:< 3 seconds] Dependent Rubor: [Left:No No] Electronic Signature(s) Signed: 01/13/2023 5:52:54 PM By: Merleen Handing RN, BSN Entered By: Merleen [name] on 01/13/2023  14:53:54 -------------------------------------------------------------------------------- Multi Wound Chart Details Patient Name: Date of Service: Krista DGES, [name] 01/13/2023 2:45 PM Medical Record Number: [id number] Patient Account Number: [id number] Date of Birth/Sex: Treating RN: April 08, 1945 (78 y.o. F) Primary Care Thyra Yinger: TOMMYE MOULDING, [place] Other Clinician: Referring Samyuktha Brau: Treating Benedicto Capozzi/Extender: Marolyn Delon Anastasio Rockey Devra in Treatment: 34 Vital Signs Height(in): 63 Pulse(bpm): 79 Weight(lbs): 210 Blood Pressure(mmHg): 129/75 Body Mass Index(BMI): 37.2 Temperature(F): 97.5 Respiratory Rate(breaths/min): 18 [1:Photos:] [N/A:N/A] Left, Dorsal Foot N/A N/A  Wound Location: Trauma N/A N/A Wounding Event: Lymphedema N/A N/A Primary Etiology: Asthma, Hypertension, Peripheral N/A N/A Comorbid History: Venous Disease, Osteoarthritis 12/24/2021 N/A N/A Date Acquired: 72 N/A N/A Weeks of Treatment: Open N/A N/A Wound Status: No N/A N/A Wound Recurrence: 0x0x0 N/A N/A Measurements L x W x D (cm) 0 N/A N/A A (cm) : rea 0 N/A N/A Volume (cm) : 100.00% N/A N/A % Reduction in Area: 100.00% N/A N/A % Reduction in Volume: Full Thickness Without Exposed N/A N/A Classification: Support Structures None Present N/A N/A Exudate Amount: None Present (0%) N/A N/A Granulation Amount: None Present (0%) N/A N/A Necrotic Amount: Fascia: No N/A N/A Exposed Structures: Fat Layer (Subcutaneous Tissue): No Tendon: No Muscle: No Joint: No Bone: No Large (67-100%) N/A N/A Epithelialization: Scarring: Yes N/A N/A Periwound Skin TextureYULANDA, DIGGS C ([id number]) [id number] Page 5 of 8 Dry/Scaly: No N/A N/A Periwound Skin Moisture: No Abnormalities Noted N/A N/A Periwound Skin Color: No Abnormality N/A N/A Temperature: Treatment Notes Electronic Signature(s) Signed: 01/13/2023 3:22:43 PM By: Marolyn Nest MD FACS Entered  By: Marolyn Nest on 01/13/2023 15:22:43 -------------------------------------------------------------------------------- Multi-Disciplinary Care Plan Details Patient Name: Date of Service: Krista DGES, [name] 01/13/2023 2:45 PM Medical Record Number: [id number] Patient Account Number: [id number] Date of Birth/Sex: Treating RN: 1945/03/06 (78 y.o. [name] Primary Care Yannis Gumbs: TOMMYE MOULDING, [place] Other Clinician: Referring Argelio Granier: Treating Aasiyah Auerbach/Extender: Marolyn Nest Anastasio Rockey Devra in Treatment: 34 Multidisciplinary Care Plan reviewed with physician Active Inactive Electronic Signature(s) Signed: 01/13/2023 5:52:54 PM By: Merleen Handing RN, BSN Entered By: Boehlein, Linda on 01/13/2023 14:56:04 -------------------------------------------------------------------------------- Pain Assessment Details Patient Name: Date of Service: Krista DGES, [name] 01/13/2023 2:45 PM Medical Record Number: [id number] Patient Account Number: [id number] Date of Birth/Sex: Treating RN: [birth year] (78 y.o. [name] Primary Care Jamie-Lee Galdamez: TOMMYE MOULDING, [place] Other Clinician: Referring Tavi Gaughran: Treating Mikail Goostree/Extender: Marolyn Nest Anastasio Rockey Devra in Treatment: 34 Active Problems Location of Pain Severity and Description of Pain Patient Has Paino No Site Locations Rate the pain. Giddings, Corliss C ([id number]) [id number] Page 6 of 8 Rate the pain. Current Pain Level: 0 Pain Management and Medication Current Pain Management: Electronic Signature(s) Signed: 01/13/2023 5:52:54 PM By: Merleen Handing RN, BSN Entered By: Boehlein, Linda on 01/13/2023 14:52:48 -------------------------------------------------------------------------------- Patient/Caregiver Education Details Patient Name: Date of Service: Krista DGES, [name] KYM 1/8/2025andnbsp2:45 PM Medical Record Number: [id number] Patient Account Number: [id number] Date of Birth/Gender:  Treating RN: Apr 11, 1945 (78 y.o. [name] Primary Care Physician: TOMMYE MOULDING, [place] Other Clinician: Referring Physician: Treating Physician/Extender: Marolyn Nest Anastasio Rockey Devra in Treatment: 17 Education Assessment Education Provided To: Patient Education Topics Provided Wound/Skin Impairment: Methods: Explain/Verbal Responses: Reinforcements needed, State content correctly Electronic Signature(s) Signed: 01/13/2023 5:52:54 PM By: Merleen Handing RN, BSN Entered By: Boehlein, Linda on 01/13/2023 14:56:23 -------------------------------------------------------------------------------- Wound Assessment Details Patient Name: Date of Service: Krista DGES, [name] 01/13/2023 2:45 PM Medical Record Number: [id number] Patient Account Number: [id number] Date of Birth/Sex: Treating RN: 1945-04-13 (78 y.o. [name] Primary Care Rhett Najera: TOMMYE MOULDING, [place] Other Clinician: Referring Alajah Witman: Treating Kanai Hilger/Extender: Marolyn Nest Anastasio Rockey [place], MACINTOSH BROCKS ([id number]) [id number] Page 7 of 8 Weeks in Treatment: 34 Wound Status Wound Number: 1 Primary Lymphedema Etiology: Wound Location: Left, Dorsal Foot Wound Status: Open Wounding Event: Trauma Comorbid Asthma, Hypertension, Peripheral Venous Disease, Date Acquired: 12/24/2021 History: Osteoarthritis Weeks Of Treatment: 34 Clustered Wound: No Photos Wound Measurements Length: (cm) Width: (cm) Depth: (cm) Area: (cm) Volume: (  cm) 0 % Reduction in Area: 100% 0 % Reduction in Volume: 100% 0 Epithelialization: Large (67-100%) 0 Tunneling: No 0 Undermining: No Wound Description Classification: Full Thickness Without Exposed Support Exudate Amount: None Present Structures Foul Odor After Cleansing: No Slough/Fibrino No Wound Bed Granulation Amount: None Present (0%) Exposed Structure Necrotic Amount: None Present (0%) Fascia Exposed: No Fat Layer (Subcutaneous  Tissue) Exposed: No Tendon Exposed: No Muscle Exposed: No Joint Exposed: No Bone Exposed: No Periwound Skin Texture Texture Color No Abnormalities Noted: No No Abnormalities Noted: Yes Scarring: Yes Temperature / Pain Temperature: No Abnormality Moisture No Abnormalities Noted: Yes Electronic Signature(s) Signed: 01/13/2023 5:52:54 PM By: Merleen Handing RN, BSN Entered By: Merleen [name] on 01/13/2023 14:55:05 -------------------------------------------------------------------------------- Vitals Details Patient Name: Date of Service: Krista DGES, [name] 01/13/2023 2:45 PM Medical Record Number: [id number] Patient Account Number: [id number] Date of Birth/Sex: Treating RN: 1946/01/03 (78 y.o. [name] Primary Care Chasmine Lender: TOMMYE MOULDING, [place] Other Clinician: Referring Mouhamadou Gittleman: Treating Kamayah Pillay/Extender: Marolyn Delon Anastasio Rockey Devra in Treatment: [address], [place] C ([id number]) [id number] Page 8 of 8 Vital Signs Time Taken: 14:55 Temperature (F): 97.5 Height (in): 63 Pulse (bpm): 79 Weight (lbs): 210 Respiratory Rate (breaths/min): 18 Body Mass Index (BMI): 37.2 Blood Pressure (mmHg): 129/75 Reference Range: 80 - 120 mg / dl Electronic Signature(s) Signed: 01/13/2023 5:52:54 PM By: Merleen Handing RN, BSN Entered By: Boehlein, Linda on 01/13/2023 14:55:29

## 2023-01-18 ENCOUNTER — Encounter: Payer: Self-pay | Admitting: Orthopedic Surgery

## 2023-01-18 NOTE — Progress Notes (Signed)
 Office Visit Note   Patient: [name]           Date of Birth: 24-Dec-1945           MRN: [id number] Visit Date: 01/12/2023              Requested by: No referring provider defined for this encounter. PCP: Cyrena Gwenn SQUIBB, MD (Inactive)  Chief Complaint  Patient presents with   Right Foot - Follow-up    Bilateral toenail trimming   Left Foot - Follow-up      HPI: Patient is a 78 year old woman who presents in follow-up for bilateral foot pain and onychomycotic nails.  Assessment & Plan: Visit Diagnoses:  1. Onychomycosis   2. Pain in right ankle and joints of right foot     Plan: Nails were trimmed x 10 without complications.  No foot ulcers or cellulitis.  Follow-Up Instructions: Return in about 3 months (around 04/12/2023).   Ortho Exam  Patient is alert, oriented, no adenopathy, well-dressed, normal affect, normal respiratory effort. Examination of both feet there are no ulcers or cellulitis.  There are no paronychial infections.  She does have thickened discolored onychomycotic nails that she is unable to safely trim on her own and the nails were trimmed x 10 without complications.  Imaging: No results found. No images are attached to the encounter.  Labs: No results found for: HGBA1C, ESRSEDRATE, CRP, LABURIC, REPTSTATUS, GRAMSTAIN, CULT, LABORGA   Lab Results  Component Value Date   ALBUMIN 3.2 (L) 06/30/2010    No results found for: MG No results found for: VD25OH  No results found for: PREALBUMIN    Latest Ref Rng & Units 10/02/2015    5:33 AM 09/25/2015    1:58 PM 06/30/2010    2:30 PM  CBC EXTENDED  WBC 4.0 - 10.5 K/uL 6.8  5.9  6.5   RBC 3.87 - 5.11 MIL/uL 3.88  4.83  4.06   Hemoglobin 12.0 - 15.0 g/dL 88.9  86.1  88.1   HCT 36.0 - 46.0 % 33.8  42.5  34.5   Platelets 150 - 400 K/uL 146  186  187   NEUT# 1.7 - 7.7 K/uL   4.0   Lymph# 0.7 - 4.0 K/uL   1.9      There is no height or weight on file to calculate  BMI.  Orders:  No orders of the defined types were placed in this encounter.  No orders of the defined types were placed in this encounter.    Procedures: No procedures performed  Clinical Data: No additional findings.  ROS:  All other systems negative, except as noted in the HPI. Review of Systems  Objective: Vital Signs: There were no vitals taken for this visit.  Specialty Comments:  No specialty comments available.  PMFS History: Patient Active Problem List   Diagnosis Date Noted   Unilateral primary osteoarthritis, left knee 10/03/2018   Unilateral primary osteoarthritis, right knee 10/03/2018   Osteoarthritis of both knees 10/31/2015   Status post total replacement of right hip 10/01/2015   Past Medical History:  Diagnosis Date   Arthritis    Asthma    Hypertension    Osteoarthritis of right hip 10/01/2015   Seasonal allergies     Family History  Problem Relation Age of Onset   Cancer Mother    Breast cancer Neg Hx     Past Surgical History:  Procedure Laterality Date   ELBOW ARTHROSCOPY WITH TENDON RECONSTRUCTION  FOOT SURGERY     TOTAL HIP ARTHROPLASTY Right 10/01/2015   Procedure: RIGHT TOTAL HIP ARTHROPLASTY ANTERIOR APPROACH;  Surgeon: Lonni CINDERELLA Poli, MD;  Location: [facility] OR;  Service: Orthopedics;  Laterality: Right;   Social History   Occupational History   Not on file  Tobacco Use   Smoking status: Former    Current packs/day: 0.00    Types: Cigarettes    Quit date: 01/05/1978    Years since quitting: 45.0   Smokeless tobacco: Never  Substance and Sexual Activity   Alcohol use: No   Drug use: No   Sexual activity: Not on file

## 2023-01-20 ENCOUNTER — Ambulatory Visit: Payer: Medicare Other

## 2023-04-12 ENCOUNTER — Ambulatory Visit (INDEPENDENT_AMBULATORY_CARE_PROVIDER_SITE_OTHER): Payer: Medicare Other | Admitting: Orthopedic Surgery

## 2023-04-12 DIAGNOSIS — B351 Tinea unguium: Secondary | ICD-10-CM

## 2023-04-12 DIAGNOSIS — G8929 Other chronic pain: Secondary | ICD-10-CM | POA: Diagnosis not present

## 2023-04-12 DIAGNOSIS — I872 Venous insufficiency (chronic) (peripheral): Secondary | ICD-10-CM

## 2023-04-12 DIAGNOSIS — M25562 Pain in left knee: Secondary | ICD-10-CM

## 2023-04-13 ENCOUNTER — Encounter: Payer: Self-pay | Admitting: Orthopedic Surgery

## 2023-04-13 NOTE — Progress Notes (Signed)
 Office Visit Note   Patient: [name]           Date of Birth: [birth year]           MRN: [id number] Visit Date: 04/12/2023              Requested by: No referring provider defined for this encounter. PCP: Ileana Ladd, MD (Inactive)  Chief Complaint  Patient presents with   Right Foot - Follow-up   Left Foot - Follow-up      HPI: Patient is a 78 year old woman who presents for evaluation of both lower extremities.  She has arthritis and valgus deformity of her left knee.  She previously has had ulcerations on both feet and a left lateral foot ulcer.  Patient has a history of onychomycosis.  Assessment & Plan: Visit Diagnoses:  1. Onychomycosis   2. Chronic pain of left knee   3. Venous insufficiency (chronic) (peripheral)     Plan: Nails were trimmed x 10.  Ulcers on both lower extremities have healed.  Follow-Up Instructions: Return in about 3 months (around 07/12/2023).   Ortho Exam  Patient is alert, oriented, no adenopathy, well-dressed, normal affect, normal respiratory effort. Examination the left lateral dorsal foot is well-healed.  There are no plantar ulcers no venous ulcers.  Patient has thickened discolored onychomycotic nails x 10 that she is unable to safely trim on her own.  After informed consent the nails were trimmed x 10 without complications.  Imaging: No results found. No images are attached to the encounter.  Labs: No results found for: "HGBA1C", "ESRSEDRATE", "CRP", "LABURIC", "REPTSTATUS", "GRAMSTAIN", "CULT", "LABORGA"   Lab Results  Component Value Date   ALBUMIN 3.2 (L) 06/30/2010    No results found for: "MG" No results found for: "VD25OH"  No results found for: "PREALBUMIN"    Latest Ref Rng & Units 10/02/2015    5:33 AM 09/25/2015    1:58 PM 06/30/2010    2:30 PM  CBC EXTENDED  WBC 4.0 - 10.5 K/uL 6.8  5.9  6.5   RBC 3.87 - 5.11 MIL/uL 3.88  4.83  4.06   Hemoglobin 12.0 - 15.0 g/dL 78.2  95.6  21.3   HCT 36.0 - 46.0  % 33.8  42.5  34.5   Platelets 150 - 400 K/uL 146  186  187   NEUT# 1.7 - 7.7 K/uL   4.0   Lymph# 0.7 - 4.0 K/uL   1.9      There is no height or weight on file to calculate BMI.  Orders:  No orders of the defined types were placed in this encounter.  No orders of the defined types were placed in this encounter.    Procedures: No procedures performed  Clinical Data: No additional findings.  ROS:  All other systems negative, except as noted in the HPI. Review of Systems  Objective: Vital Signs: There were no vitals taken for this visit.  Specialty Comments:  No specialty comments available.  PMFS History: Patient Active Problem List   Diagnosis Date Noted   Unilateral primary osteoarthritis, left knee 10/03/2018   Unilateral primary osteoarthritis, right knee 10/03/2018   Osteoarthritis of both knees 10/31/2015   Status post total replacement of right hip 10/01/2015   Past Medical History:  Diagnosis Date   Arthritis    Asthma    Hypertension    Osteoarthritis of right hip 10/01/2015   Seasonal allergies     Family History  Problem Relation Age  of Onset   Cancer Mother    Breast cancer Neg Hx     Past Surgical History:  Procedure Laterality Date   ELBOW ARTHROSCOPY WITH TENDON RECONSTRUCTION     FOOT SURGERY     TOTAL HIP ARTHROPLASTY Right 10/01/2015   Procedure: RIGHT TOTAL HIP ARTHROPLASTY ANTERIOR APPROACH;  Surgeon: Kathryne Hitch, MD;  Location: [facility] OR;  Service: Orthopedics;  Laterality: Right;   Social History   Occupational History   Not on file  Tobacco Use   Smoking status: Former    Current packs/day: 0.00    Types: Cigarettes    Quit date: 01/05/1978    Years since quitting: 45.2   Smokeless tobacco: Never  Substance and Sexual Activity   Alcohol use: No   Drug use: No   Sexual activity: Not on file

## 2023-07-12 ENCOUNTER — Ambulatory Visit (INDEPENDENT_AMBULATORY_CARE_PROVIDER_SITE_OTHER): Admitting: Orthopedic Surgery

## 2023-07-12 ENCOUNTER — Encounter: Payer: Self-pay | Admitting: Orthopedic Surgery

## 2023-07-12 DIAGNOSIS — B351 Tinea unguium: Secondary | ICD-10-CM

## 2023-07-12 NOTE — Progress Notes (Signed)
 Office Visit Note   Patient: [name]           Date of Birth: 07/24/1945           MRN: [id number] Visit Date: 07/12/2023              Requested by: No referring provider defined for this encounter. PCP: Cyrena Gwenn SQUIBB, MD (Inactive)  Chief Complaint  Patient presents with   Right Foot - Follow-up   Left Foot - Follow-up      HPI: Patient is a 78 year old woman who presents in follow-up with painful onychomycotic nails x 10.  Assessment & Plan: Visit Diagnoses:  1. Onychomycosis     Plan: Nails were trimmed x 10 without complication.  Follow-Up Instructions: Return in about 3 months (around 10/12/2023).   Ortho Exam  Patient is alert, oriented, no adenopathy, well-dressed, normal affect, normal respiratory effort. Examination patient has no plantar ulcers no venous stasis ulcers.  She has thickened discolored onychomycotic nails x 10 that she is unable to safely trim on her own.  The nails were trimmed x 10 without complication.    Imaging: No results found. No images are attached to the encounter.  Labs: No results found for: HGBA1C, ESRSEDRATE, CRP, LABURIC, REPTSTATUS, GRAMSTAIN, CULT, LABORGA   Lab Results  Component Value Date   ALBUMIN 3.2 (L) 06/30/2010    No results found for: MG No results found for: VD25OH  No results found for: PREALBUMIN    Latest Ref Rng & Units 10/02/2015    5:33 AM 09/25/2015    1:58 PM 06/30/2010    2:30 PM  CBC EXTENDED  WBC 4.0 - 10.5 K/uL 6.8  5.9  6.5   RBC 3.87 - 5.11 MIL/uL 3.88  4.83  4.06   Hemoglobin 12.0 - 15.0 g/dL 88.9  86.1  88.1   HCT 36.0 - 46.0 % 33.8  42.5  34.5   Platelets 150 - 400 K/uL 146  186  187   NEUT# 1.7 - 7.7 K/uL   4.0   Lymph# 0.7 - 4.0 K/uL   1.9      There is no height or weight on file to calculate BMI.  Orders:  No orders of the defined types were placed in this encounter.  No orders of the defined types were placed in this encounter.     Procedures: No procedures performed  Clinical Data: No additional findings.  ROS:  All other systems negative, except as noted in the HPI. Review of Systems  Objective: Vital Signs: There were no vitals taken for this visit.  Specialty Comments:  No specialty comments available.  PMFS History: Patient Active Problem List   Diagnosis Date Noted   Unilateral primary osteoarthritis, left knee 10/03/2018   Unilateral primary osteoarthritis, right knee 10/03/2018   Osteoarthritis of both knees 10/31/2015   Status post total replacement of right hip 10/01/2015   Past Medical History:  Diagnosis Date   Arthritis    Asthma    Hypertension    Osteoarthritis of right hip 10/01/2015   Seasonal allergies     Family History  Problem Relation Age of Onset   Cancer Mother    Breast cancer Neg Hx     Past Surgical History:  Procedure Laterality Date   ELBOW ARTHROSCOPY WITH TENDON RECONSTRUCTION     FOOT SURGERY     TOTAL HIP ARTHROPLASTY Right 10/01/2015   Procedure: RIGHT TOTAL HIP ARTHROPLASTY ANTERIOR APPROACH;  Surgeon: Lonni CINDERELLA Poli,  MD;  Location: [facility] OR;  Service: Orthopedics;  Laterality: Right;   Social History   Occupational History   Not on file  Tobacco Use   Smoking status: Former    Current packs/day: 0.00    Types: Cigarettes    Quit date: 01/05/1978    Years since quitting: 45.5   Smokeless tobacco: Never  Substance and Sexual Activity   Alcohol use: No   Drug use: No   Sexual activity: Not on file

## 2023-10-18 ENCOUNTER — Ambulatory Visit (INDEPENDENT_AMBULATORY_CARE_PROVIDER_SITE_OTHER): Admitting: Orthopedic Surgery

## 2023-10-18 DIAGNOSIS — B351 Tinea unguium: Secondary | ICD-10-CM

## 2023-10-18 DIAGNOSIS — L97521 Non-pressure chronic ulcer of other part of left foot limited to breakdown of skin: Secondary | ICD-10-CM

## 2023-10-18 DIAGNOSIS — I872 Venous insufficiency (chronic) (peripheral): Secondary | ICD-10-CM | POA: Diagnosis not present

## 2023-10-19 ENCOUNTER — Encounter: Payer: Self-pay | Admitting: Orthopedic Surgery

## 2023-10-19 NOTE — Progress Notes (Signed)
 Office Visit Note   Patient: [name]           Date of Birth: [birth year]           MRN: [id number] Visit Date: 10/18/2023              Requested by: No referring provider defined for this encounter. PCP: Cyrena Gwenn SQUIBB, MD (Inactive)  Chief Complaint  Patient presents with   Right Foot - Follow-up   Left Foot - Follow-up      HPI: Discussed the use of AI scribe software for clinical note transcription with the patient, who gave verbal consent to proceed.  History of Present Illness [name] is a 78 year old female who presents with chronic ulceration on the left foot and nail care.  No new symptoms or changes in the ulceration were reported.  She is unable to safely trim her nails on her own.  No significant discomfort related to swelling was reported.     Assessment & Plan: Visit Diagnoses:  1. Onychomycosis   2. Venous insufficiency (chronic) (peripheral)     Plan: Assessment and Plan Assessment & Plan Chronic ulcer of lateral left foot Chronic ulceration of the lateral left foot, well-managed, with healthy granulation tissue.  Venous and lymphatic swelling of both legs Venous and lymphatic swelling of both legs without open ulcers.  Valgus deformity of both knees Valgus alignment in both knees during ambulation.  Onychomycosis of toenails Thickened, discolored onychomycotic nails on all ten toenails. She is unable to safely trim the nails on her own. - Trim nails times ten without complication.      Follow-Up Instructions: Return in about 3 months (around 01/18/2024).   Ortho Exam  Patient is alert, oriented, no adenopathy, well-dressed, normal affect, normal respiratory effort. Physical Exam EXTREMITIES: Chronic ulceration on lateral left foot, stable, covered with Band-Aid. Wound on left foot 3mm in diameter with healthy granulation tissue. Thickened discolored onychomycotic nails on all toes. Toenails trimmed  without complication. Venous and lymphatic swelling of both legs, no open ulcers. MUSCULOSKELETAL: Valgus alignment of both knees with ambulation.      Imaging: No results found. No images are attached to the encounter.  Labs: No results found for: HGBA1C, ESRSEDRATE, CRP, LABURIC, REPTSTATUS, GRAMSTAIN, CULT, LABORGA   Lab Results  Component Value Date   ALBUMIN 3.2 (L) 06/30/2010    No results found for: MG No results found for: VD25OH  No results found for: PREALBUMIN    Latest Ref Rng & Units 10/02/2015    5:33 AM 09/25/2015    1:58 PM 06/30/2010    2:30 PM  CBC EXTENDED  WBC 4.0 - 10.5 K/uL 6.8  5.9  6.5   RBC 3.87 - 5.11 MIL/uL 3.88  4.83  4.06   Hemoglobin 12.0 - 15.0 g/dL 88.9  86.1  88.1   HCT 36.0 - 46.0 % 33.8  42.5  34.5   Platelets 150 - 400 K/uL 146  186  187   NEUT# 1.7 - 7.7 K/uL   4.0   Lymph# 0.7 - 4.0 K/uL   1.9      There is no height or weight on file to calculate BMI.  Orders:  No orders of the defined types were placed in this encounter.  No orders of the defined types were placed in this encounter.    Procedures: No procedures performed  Clinical Data: No additional findings.  ROS:  All other systems negative, except  as noted in the HPI. Review of Systems  Objective: Vital Signs: There were no vitals taken for this visit.  Specialty Comments:  No specialty comments available.  PMFS History: Patient Active Problem List   Diagnosis Date Noted   Unilateral primary osteoarthritis, left knee 10/03/2018   Unilateral primary osteoarthritis, right knee 10/03/2018   Osteoarthritis of both knees 10/31/2015   Status post total replacement of right hip 10/01/2015   Past Medical History:  Diagnosis Date   Arthritis    Asthma    Hypertension    Osteoarthritis of right hip 10/01/2015   Seasonal allergies     Family History  Problem Relation Age of Onset   Cancer Mother    Breast cancer Neg Hx     Past  Surgical History:  Procedure Laterality Date   ELBOW ARTHROSCOPY WITH TENDON RECONSTRUCTION     FOOT SURGERY     TOTAL HIP ARTHROPLASTY Right 10/01/2015   Procedure: RIGHT TOTAL HIP ARTHROPLASTY ANTERIOR APPROACH;  Surgeon: Lonni CINDERELLA Poli, MD;  Location: [facility] OR;  Service: Orthopedics;  Laterality: Right;   Social History   Occupational History   Not on file  Tobacco Use   Smoking status: Former    Current packs/day: 0.00    Types: Cigarettes    Quit date: 01/05/1978    Years since quitting: 45.8   Smokeless tobacco: Never  Substance and Sexual Activity   Alcohol use: No   Drug use: No   Sexual activity: Not on file

## 2023-11-08 ENCOUNTER — Encounter: Payer: Self-pay | Admitting: Radiology

## 2024-01-20 ENCOUNTER — Ambulatory Visit: Admitting: Physician Assistant

## 2024-01-27 ENCOUNTER — Ambulatory Visit: Admitting: Physician Assistant

## 2024-01-27 DIAGNOSIS — B351 Tinea unguium: Secondary | ICD-10-CM

## 2024-01-27 DIAGNOSIS — L84 Corns and callosities: Secondary | ICD-10-CM

## 2024-01-28 ENCOUNTER — Encounter: Payer: Self-pay | Admitting: Physician Assistant

## 2024-01-28 NOTE — Progress Notes (Signed)
 "  Office Visit Note   Patient: [name]           Date of Birth: [birth year]           MRN: [id number] Visit Date: 01/27/2024              Requested by: No referring provider defined for this encounter. PCP: Cyrena Gwenn SQUIBB, MD (Inactive)  Chief Complaint  Patient presents with   Right Foot - Follow-up   Left Foot - Follow-up      HPI: 79 y/o female with history of onychomycosis and left fifth toe callus.  She is unable to safely trim her nails on her own and the left fifth toe callus causes her pain when it rubs in her shoe.    Assessment & Plan: Visit Diagnoses: No diagnosis found.  Plan: Onychomycosis with re current left fifth toe callus.  Callus trimmed and 10 nails trimmed.  She tolerated this well.  She will follow for future nail trimming in 3 months.  Follow-Up Instructions: Return in about 3 months (around 04/26/2024).   Ortho Exam  Patient is alert, oriented, no adenopathy, well-dressed, normal affect, normal respiratory effort. Feet warm and well perfused, no ischemic skin changes.  No edema or cellulitis in B feet. After verbal consent the fifth toe callus was trimmed to soft skin and 10 nails were trimmed.      Imaging: No results found. No images are attached to the encounter.  Labs: No results found for: HGBA1C, ESRSEDRATE, CRP, LABURIC, REPTSTATUS, GRAMSTAIN, CULT, LABORGA   Lab Results  Component Value Date   ALBUMIN 3.2 (L) 06/30/2010    No results found for: MG No results found for: VD25OH  No results found for: PREALBUMIN    Latest Ref Rng & Units 10/02/2015    5:33 AM 09/25/2015    1:58 PM 06/30/2010    2:30 PM  CBC EXTENDED  WBC 4.0 - 10.5 K/uL 6.8  5.9  6.5   RBC 3.87 - 5.11 MIL/uL 3.88  4.83  4.06   Hemoglobin 12.0 - 15.0 g/dL 88.9  86.1  88.1   HCT 36.0 - 46.0 % 33.8  42.5  34.5   Platelets 150 - 400 K/uL 146  186  187   NEUT# 1.7 - 7.7 K/uL   4.0   Lymph# 0.7 - 4.0 K/uL   1.9      There is no  height or weight on file to calculate BMI.  Orders:  No orders of the defined types were placed in this encounter.  No orders of the defined types were placed in this encounter.    Procedures: No procedures performed  Clinical Data: No additional findings.  ROS:  All other systems negative, except as noted in the HPI. Review of Systems  Objective: Vital Signs: There were no vitals taken for this visit.  Specialty Comments:  No specialty comments available.  PMFS History: Patient Active Problem List   Diagnosis Date Noted   Unilateral primary osteoarthritis, left knee 10/03/2018   Unilateral primary osteoarthritis, right knee 10/03/2018   Osteoarthritis of both knees 10/31/2015   Status post total replacement of right hip 10/01/2015   Past Medical History:  Diagnosis Date   Arthritis    Asthma    Hypertension    Osteoarthritis of right hip 10/01/2015   Seasonal allergies     Family History  Problem Relation Age of Onset   Cancer Mother    Breast cancer Neg Hx  Past Surgical History:  Procedure Laterality Date   ELBOW ARTHROSCOPY WITH TENDON RECONSTRUCTION     FOOT SURGERY     TOTAL HIP ARTHROPLASTY Right 10/01/2015   Procedure: RIGHT TOTAL HIP ARTHROPLASTY ANTERIOR APPROACH;  Surgeon: Lonni CINDERELLA Poli, MD;  Location: [facility] OR;  Service: Orthopedics;  Laterality: Right;   Social History   Occupational History   Not on file  Tobacco Use   Smoking status: Former    Current packs/day: 0.00    Types: Cigarettes    Quit date: 01/05/1978    Years since quitting: 46.0   Smokeless tobacco: Never  Substance and Sexual Activity   Alcohol use: No   Drug use: No   Sexual activity: Not on file       "

## 2024-04-26 ENCOUNTER — Ambulatory Visit: Admitting: Physician Assistant
